# Patient Record
Sex: Female | Born: 1960 | Race: White | Hispanic: No | Marital: Single | Smoking: Never smoker
Health system: Southern US, Community
[De-identification: ages and names within clinical notes are randomized; demographics above are authoritative.]

## PROBLEM LIST (undated history)

## (undated) DIAGNOSIS — C801 Malignant (primary) neoplasm, unspecified: Secondary | ICD-10-CM

## (undated) DIAGNOSIS — K219 Gastro-esophageal reflux disease without esophagitis: Secondary | ICD-10-CM

## (undated) DIAGNOSIS — C8510 Unspecified B-cell lymphoma, unspecified site: Secondary | ICD-10-CM

## (undated) DIAGNOSIS — R079 Chest pain, unspecified: Secondary | ICD-10-CM

## (undated) DIAGNOSIS — E860 Dehydration: Secondary | ICD-10-CM

## (undated) DIAGNOSIS — E039 Hypothyroidism, unspecified: Secondary | ICD-10-CM

## (undated) HISTORY — DX: Chest pain, unspecified: R07.9

## (undated) HISTORY — PX: TOTAL ABDOMINAL HYSTERECTOMY: SHX209

## (undated) HISTORY — PX: ABDOMINAL HYSTERECTOMY: SHX81

---
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
## 2004-11-12 ENCOUNTER — Ambulatory Visit: Payer: Self-pay | Admitting: Endocrinology

## 2004-12-28 ENCOUNTER — Ambulatory Visit: Payer: Self-pay | Admitting: Obstetrics and Gynecology

## 2005-03-03 ENCOUNTER — Ambulatory Visit: Payer: Self-pay | Admitting: General Practice

## 2005-03-09 ENCOUNTER — Ambulatory Visit: Payer: Self-pay | Admitting: General Practice

## 2005-03-11 ENCOUNTER — Ambulatory Visit: Payer: Self-pay | Admitting: Internal Medicine

## 2005-03-20 ENCOUNTER — Ambulatory Visit: Payer: Self-pay | Admitting: Internal Medicine

## 2005-04-20 ENCOUNTER — Ambulatory Visit: Payer: Self-pay | Admitting: Internal Medicine

## 2005-06-11 ENCOUNTER — Ambulatory Visit: Payer: Self-pay | Admitting: Internal Medicine

## 2005-06-20 ENCOUNTER — Ambulatory Visit: Payer: Self-pay | Admitting: Internal Medicine

## 2005-10-11 ENCOUNTER — Ambulatory Visit: Payer: Self-pay | Admitting: Internal Medicine

## 2005-10-21 ENCOUNTER — Ambulatory Visit: Payer: Self-pay | Admitting: Internal Medicine

## 2005-11-18 ENCOUNTER — Ambulatory Visit: Payer: Self-pay | Admitting: Internal Medicine

## 2005-12-19 ENCOUNTER — Ambulatory Visit: Payer: Self-pay | Admitting: Internal Medicine

## 2006-03-18 ENCOUNTER — Emergency Department: Payer: Self-pay | Admitting: Internal Medicine

## 2006-04-04 ENCOUNTER — Ambulatory Visit: Payer: Self-pay | Admitting: Internal Medicine

## 2006-04-07 ENCOUNTER — Ambulatory Visit: Payer: Self-pay | Admitting: General Practice

## 2006-04-20 ENCOUNTER — Ambulatory Visit: Payer: Self-pay | Admitting: Internal Medicine

## 2006-11-01 ENCOUNTER — Ambulatory Visit: Payer: Self-pay | Admitting: Internal Medicine

## 2006-11-19 ENCOUNTER — Ambulatory Visit: Payer: Self-pay | Admitting: Internal Medicine

## 2007-04-21 ENCOUNTER — Ambulatory Visit: Payer: Self-pay | Admitting: Internal Medicine

## 2007-06-21 ENCOUNTER — Ambulatory Visit: Payer: Self-pay | Admitting: Internal Medicine

## 2008-04-12 ENCOUNTER — Emergency Department: Payer: Self-pay | Admitting: Emergency Medicine

## 2009-06-25 ENCOUNTER — Ambulatory Visit: Payer: Self-pay

## 2011-04-28 ENCOUNTER — Ambulatory Visit: Payer: Self-pay

## 2014-02-21 ENCOUNTER — Emergency Department: Payer: Self-pay | Admitting: Emergency Medicine

## 2014-02-21 LAB — CBC WITH DIFFERENTIAL/PLATELET
BASOS ABS: 0 10*3/uL (ref 0.0–0.1)
BASOS PCT: 0.2 %
EOS ABS: 0 10*3/uL (ref 0.0–0.7)
EOS PCT: 0.1 %
HCT: 37.3 % (ref 35.0–47.0)
HGB: 12.2 g/dL (ref 12.0–16.0)
LYMPHS PCT: 9.2 %
Lymphocyte #: 0.7 10*3/uL — ABNORMAL LOW (ref 1.0–3.6)
MCH: 28.9 pg (ref 26.0–34.0)
MCHC: 32.8 g/dL (ref 32.0–36.0)
MCV: 88 fL (ref 80–100)
MONO ABS: 0.7 x10 3/mm (ref 0.2–0.9)
Monocyte %: 8.8 %
Neutrophil #: 6.1 10*3/uL (ref 1.4–6.5)
Neutrophil %: 81.7 %
Platelet: 196 10*3/uL (ref 150–440)
RBC: 4.23 10*6/uL (ref 3.80–5.20)
RDW: 13.1 % (ref 11.5–14.5)
WBC: 7.5 10*3/uL (ref 3.6–11.0)

## 2014-02-21 LAB — COMPREHENSIVE METABOLIC PANEL
ALK PHOS: 87 U/L
ALT: 16 U/L (ref 12–78)
ANION GAP: 5 — AB (ref 7–16)
Albumin: 3.7 g/dL (ref 3.4–5.0)
BUN: 7 mg/dL (ref 7–18)
Bilirubin,Total: 0.3 mg/dL (ref 0.2–1.0)
CREATININE: 0.85 mg/dL (ref 0.60–1.30)
Calcium, Total: 9.2 mg/dL (ref 8.5–10.1)
Chloride: 106 mmol/L (ref 98–107)
Co2: 27 mmol/L (ref 21–32)
EGFR (Non-African Amer.): >60
Glucose: 98 mg/dL (ref 65–99)
Osmolality: 274 (ref 275–301)
Potassium: 4 mmol/L (ref 3.5–5.1)
SGOT(AST): 24 U/L (ref 15–37)
Sodium: 138 mmol/L (ref 136–145)
TOTAL PROTEIN: 7.4 g/dL (ref 6.4–8.2)

## 2014-02-21 LAB — TROPONIN I: Troponin-I: <0.02 ng/mL

## 2015-07-24 ENCOUNTER — Ambulatory Visit (INDEPENDENT_AMBULATORY_CARE_PROVIDER_SITE_OTHER): Payer: No Typology Code available for payment source

## 2015-07-24 ENCOUNTER — Encounter: Payer: Self-pay | Admitting: Emergency Medicine

## 2015-07-24 ENCOUNTER — Ambulatory Visit
Admission: EM | Admit: 2015-07-24 | Discharge: 2015-07-24 | Disposition: A | Discharge status: Home / Self Care | Payer: No Typology Code available for payment source | Attending: Family Medicine | Admitting: Family Medicine

## 2015-07-24 DIAGNOSIS — J40 Bronchitis, not specified as acute or chronic: Secondary | ICD-10-CM

## 2015-07-24 MED ORDER — AZITHROMYCIN 250 MG PO TABS: ORAL_TABLET | ORAL | Status: DC

## 2015-07-24 MED ORDER — PREDNISONE 20 MG PO TABS
40.0000 mg | ORAL_TABLET | Freq: Every day | ORAL | Status: DC
Start: 2015-07-24 — End: 2016-09-22

## 2015-07-24 MED ORDER — ALBUTEROL SULFATE HFA 108 (90 BASE) MCG/ACT IN AERS: 2.0000 | INHALATION_SPRAY | RESPIRATORY_TRACT | Status: DC | PRN

## 2015-07-24 MED ORDER — GUAIFENESIN-CODEINE 100-10 MG/5ML PO SOLN: 5.0000 mL | Freq: Three times a day (TID) | ORAL | Status: DC | PRN

## 2015-07-24 MED ORDER — IPRATROPIUM-ALBUTEROL 0.5-2.5 (3) MG/3ML IN SOLN
3.0000 mL | Freq: Once | RESPIRATORY_TRACT | Status: AC
  Administered 2015-07-24: 3 mL via RESPIRATORY_TRACT

## 2015-07-24 NOTE — ED Notes (Signed)
Patient states she has had a deep cough for the past 2 weeks, back feels tight and started hurting 3 days ago

## 2015-07-24 NOTE — Discharge Instructions (Signed)
Take medication as prescribed. Rest. Drink plenty of fluids. Take over the counter tylenol or ibuprofen as needed.   Follow up with your primary care physician next week. Return to Urgent care or proceed to ER for chest pain, shortness of breath, abdominal pain, uncontrolled fever, new or worsening concerns.

## 2015-07-24 NOTE — ED Provider Notes (Signed)
[facility] note ____________________________________________  Time seen: Approximately 12:33 PM  I have reviewed the triage vital signs and the nursing notes.   HISTORY  Chief Complaint Cough   HPI Christina [name] is a 54 y.o. female presents for complaints for x 2 weeks of cough, congestion and runny nose. States congestion and runny nose has improved but continues with cough. States main complaint is cough. States that she does intermittently hear herself wheezing. States she at times feels some chest tightness and states that that is only present with coughing and wheezing. Denies chest pain. Denies shortness of breath. Also does report she is having some achy back pain and states that she feels like it's from coughing so often. Denies fall or trauma.  States current achiness sensation is 5 out of 10. Denies pain radiation. Denies pain with breaths or deep breath. Denies fevers. States occasionally cough is productive with whitish thin phlegm. Denies neck pain. Denies headache. Denies chest pain, shortness of breath, abdominal pain. Denies dysuria, vaginal complaints, urinary or bowel changes. Reports continues to eat and drink well. States has used over-the-counter Mucinex DM and cough drops without much relief.   History reviewed. No pertinent past medical history. Denies.  There are no active problems to display for this patient.   Past Surgical History  Procedure Laterality Date  . Cesarean section      x 2     No current outpatient prescriptions on file.  Allergies Review of patient's allergies indicates no known allergies.  History reviewed. No pertinent family history.  Social History Social History  Substance Use Topics  . Smoking status: Never Smoker   . Smokeless tobacco: None  . Alcohol Use: No    Review of Systems Constitutional: No fever/chills Eyes: No visual changes. ENT: No sore throat. Positive nasal congestion and runny  nose. Cardiovascular: Denies chest pain. Respiratory: Denies shortness of breath. Positive cough. Gastrointestinal: No abdominal pain.  No nausea, no vomiting.  No diarrhea.  No constipation. Genitourinary: Negative for dysuria. Musculoskeletal: Negative for back pain. Skin: Negative for rash. Neurological: Negative for headaches, focal weakness or numbness.  10-point ROS otherwise negative.  ____________________________________________   PHYSICAL EXAM:  VITAL SIGNS: ED Triage Vitals  Enc Vitals Group     BP 07/24/15 1126 117/75 mmHg     Pulse Rate 07/24/15 1126 65     Resp 07/24/15 1126 20     Temp 07/24/15 1126 97 F (36.1 C)     Temp Source 07/24/15 1126 Tympanic     SpO2 07/24/15 1126 100 %     Weight 07/24/15 1126 151 lb (68.493 kg)     Height 07/24/15 1126 5\' 6"  (1.676 m)     Head Cir --      Peak Flow --      Pain Score 07/24/15 1125 5     Pain Loc --      Pain Edu? --      Excl. in [place]? --     Constitutional: Alert and oriented. Well appearing and in no acute distress. Eyes: Conjunctivae are normal. PERRL. EOMI. Head: Atraumatic.Sinuses Nontender. No erythema. No swelling.  Ears: no erythema, normal TMs bilaterally.   Nose: Mild clear rhinorrhea.  Mouth/Throat: Mucous membranes are moist.  Oropharynx non-erythematous. No tonsillar swelling or exudate. No uvular shift or deviation. Neck: No stridor.  No cervical spine tenderness to palpation. Hematological/Lymphatic/Immunilogical: No cervical lymphadenopathy. Cardiovascular: Normal rate, regular rhythm. Grossly normal heart sounds.  Good peripheral circulation. Respiratory:  Normal respiratory effort.  No retractions. Mild scattered rhonchi. Mild scattered inspiratory wheezes present. Dry intermittent cough present. No rales. Speaks in complete sentences. Gastrointestinal: Soft and nontender. No distention. Normal Bowel sounds.  No CVA tenderness. Musculoskeletal: No lower or upper extremity tenderness nor edema.  Bilateral pedal pulses equal and easily palpated.  Neurologic:  Normal speech and language. No gross focal neurologic deficits are appreciated. No gait instability. Skin:  Skin is warm, dry and intact. No rash noted. Psychiatric: Mood and affect are normal. Speech and behavior are normal.  ____________________________________________   LABS (all labs ordered are listed, but only abnormal results are displayed)  Labs Reviewed - No data to display  RADIOLOGY  EXAM: CHEST 2 VIEW  COMPARISON: None.  FINDINGS: Mild hyperinflation, possible asthma. Negative for pneumonia. Mild apical scarring bilaterally. Negative for mass or effusion. Heart size and vascularity normal.  IMPRESSION: Pulmonary hyperinflation, possible asthma. Lungs are clear.   Electronically Signed By: Franchot Gallo M.D. On: 07/24/2015 12:47      I, Marylene Land, personally viewed and evaluated these images (plain radiographs) as part of my medical decision making.    INITIAL IMPRESSION / ASSESSMENT AND PLAN / ED COURSE  Pertinent labs & imaging results that were available during my care of the patient were reviewed by me and considered in my medical decision making (see chart for details).  Well-appearing patient. No acute distress. Presents for 2 weeks of cough and congestion with continued cough. Reports intermittent wheezing. Denies chest pain or shortness of breath. Moist mucous membranes. Mild scattered rhonchi as well as scattered wheezes present. Albuterol ipratropium nebulizer 1. Will evaluate chest x-ray.  Post albuterol nebulizer wheezes fully resolved and rhonchi improved. Patient reports that she does feel better.   Patient had to leave urgent care for work meeting prior to myself being able to visualize x-ray. Chest x-ray pulmonary hyperinflation, possible asthma, lungs are clear per radiologist. Will treat bronchitis with oral azithromycin, when necessary pro-air inhaler, 5 day  course of prednisone as well as when necessary codeine guaifenesin cough solution. Also discussed supportive treatments including rest, fluids, when necessary Tylenol. Discussed this plan with patient face-to-face prior to her leaving tentative to chest x-ray. Also called patient and discuss final chest x-ray reading and rediscuss treatment plan. Patient states that she will stop back by urgent care to pick up her prescriptions and paperwork. Prescriptions and paperwork for the above left with Jadene Pierini. Discussed follow up with Primary care physician this week. Discussed follow up and return parameters including no resolution or any worsening concerns. Patient verbalized understanding and agreed to plan.   ____________________________________________   FINAL CLINICAL IMPRESSION(S) / ED DIAGNOSES  Final diagnoses:  Bronchitis       Marylene Land, NP 07/24/15 1448

## 2015-09-21 HISTORY — PX: REFRACTIVE SURGERY: SHX103

## 2016-09-10 ENCOUNTER — Ambulatory Visit: Payer: Self-pay | Admitting: Family Medicine

## 2016-09-20 DIAGNOSIS — C801 Malignant (primary) neoplasm, unspecified: Secondary | ICD-10-CM

## 2016-09-20 HISTORY — DX: Malignant (primary) neoplasm, unspecified: C80.1

## 2016-09-22 ENCOUNTER — Encounter: Payer: Self-pay | Admitting: Unknown Physician Specialty

## 2016-09-22 ENCOUNTER — Ambulatory Visit: Payer: Self-pay | Admitting: Family Medicine

## 2016-09-22 ENCOUNTER — Ambulatory Visit (INDEPENDENT_AMBULATORY_CARE_PROVIDER_SITE_OTHER): Payer: Managed Care, Other (non HMO) | Admitting: Unknown Physician Specialty

## 2016-09-22 VITALS — BP 116/75 | HR 62 | Temp 98.3°F | Ht 65.1 in | Wt 160.8 lb

## 2016-09-22 DIAGNOSIS — Z23 Encounter for immunization: Secondary | ICD-10-CM

## 2016-09-22 DIAGNOSIS — R221 Localized swelling, mass and lump, neck: Secondary | ICD-10-CM | POA: Diagnosis not present

## 2016-09-22 MED ORDER — FLUCONAZOLE 150 MG PO TABS: 150.0000 mg | ORAL_TABLET | Freq: Once | ORAL | 0 refills | Status: AC

## 2016-09-22 MED ORDER — DOXYCYCLINE HYCLATE 100 MG PO TABS: 100.0000 mg | ORAL_TABLET | Freq: Two times a day (BID) | ORAL | 0 refills | Status: DC

## 2016-09-22 NOTE — Progress Notes (Signed)
BP 116/75 (BP Location: Left Arm, Patient Position: Sitting, Cuff Size: Large)   Pulse 62   Temp 98.3 F (36.8 C)   Ht 5' 5.1" (1.654 m)   Wt 160 lb 12.8 oz (72.9 kg)   LMP  (LMP Unknown)   SpO2 97%   BMI 26.68 kg/m    Subjective:    Patient ID: Christina [name], female    DOB: [birth year], 56 y.o.   MRN: [id number]  HPI: Christina [name] is a 56 y.o. female  Chief Complaint  Patient presents with  . Cyst    pt states she has a knot on the right side of her neck that came up about a month ago, states it is not painful to the touch   Pt is here with her husband with cc of a knot on the right side of neck.  She works at [employer] and vet "put a needle in it" about 2 weeks ago and unable to withdraw fluid but "didn't go . States the size fluctuates with getting bigger and smaller.  No fever.  She did have a sore throat for a week when she first noticed it. No problems with voice or swallowing.  No cough.  Husband says her energy level is down.  She is a non-smoker  Relevant past medical, surgical, family and social history reviewed and updated as indicated. Interim medical history since our last visit reviewed. Allergies and medications reviewed and updated.  Review of Systems  Per HPI unless specifically indicated above     Objective:    BP 116/75 (BP Location: Left Arm, Patient Position: Sitting, Cuff Size: Large)   Pulse 62   Temp 98.3 F (36.8 C)   Ht 5' 5.1" (1.654 m)   Wt 160 lb 12.8 oz (72.9 kg)   LMP  (LMP Unknown)   SpO2 97%   BMI 26.68 kg/m   Wt Readings from Last 3 Encounters:  09/22/16 160 lb 12.8 oz (72.9 kg)  07/24/15 151 lb (68.5 kg)    Physical Exam  Constitutional: She is oriented to person, place, and time. She appears well-developed and well-nourished. No distress.  HENT:  Head: Normocephalic and atraumatic.  Eyes: Conjunctivae and lids are normal. Right eye exhibits no discharge. Left eye exhibits no discharge. No scleral icterus.  Neck:  Normal range of motion. Neck supple. No JVD present. Carotid bruit is not present.    Cardiovascular: Normal rate, regular rhythm and normal heart sounds.   Pulmonary/Chest: Effort normal and breath sounds normal.  Abdominal: Normal appearance. There is no splenomegaly or hepatomegaly.  Musculoskeletal: Normal range of motion.  Neurological: She is alert and oriented to person, place, and time.  Skin: Skin is warm, dry and intact. No rash noted. No pallor.  Psychiatric: She has a normal mood and affect. Her behavior is normal. Judgment and thought content normal.    Results for orders placed or performed in visit on 02/21/14  CBC with Differential/Platelet  Result Value Ref Range   WBC 7.5 3.6 - 11.0 x10 3/mm 3   RBC 4.23 3.80 - 5.20 X10 6/mm 3   HGB 12.2 12.0 - 16.0 g/dL   HCT 37.3 35.0 - 47.0 %   MCV 88 80 - 100 fL   MCH 28.9 26.0 - 34.0 pg   MCHC 32.8 32.0 - 36.0 g/dL   RDW 13.1 11.5 - 14.5 %   Platelet 196 150 - 440 x10 3/mm 3   Neutrophil % 81.7 %  Lymphocyte % 9.2 %   Monocyte % 8.8 %   Eosinophil % 0.1 %   Basophil % 0.2 %   Neutrophil # 6.1 1.4 - 6.5 x10 3/mm 3   Lymphocyte # 0.7 (L) 1.0 - 3.6 x10 3/mm 3   Monocyte # 0.7 0.2 - 0.9 x10 3/mm    Eosinophil # 0.0 0.0 - 0.7 x10 3/mm 3   Basophil # 0.0 0.0 - 0.1 x10 3/mm 3  Comprehensive metabolic panel  Result Value Ref Range   Glucose 98 65 - 99 mg/dL   BUN 7 7 - 18 mg/dL   Creatinine 0.85 0.60 - 1.30 mg/dL   Sodium 138 136 - 145 mmol/L   Potassium 4.0 3.5 - 5.1 mmol/L   Chloride 106 98 - 107 mmol/L   Co2 27 21 - 32 mmol/L   Calcium, Total 9.2 8.5 - 10.1 mg/dL   SGOT(AST) 24 15 - 37 Unit/L   SGPT (ALT) 16 12 - 78 U/L   Alkaline Phosphatase 87 Unit/L   Albumin 3.7 3.4 - 5.0 g/dL   Total Protein 7.4 6.4 - 8.2 g/dL   Bilirubin,Total 0.3 0.2 - 1.0 mg/dL   Osmolality 274 275 - 301   Anion Gap 5 (L) 7 - 16   EGFR (African American) >60    EGFR (Non-African Amer.) >60   Troponin I  Result Value Ref Range    Troponin-I < 0.02 ng/mL      Assessment & Plan:   Problem List Items Addressed This Visit    None    Visit Diagnoses    Need for diphtheria-tetanus-pertussis (Tdap) vaccine, adult/adolescent    -  Primary   Relevant Orders   Tdap vaccine greater than or equal to 7yo IM (Completed)   Neck mass       Pt with mass on neck.  Treat for lymphadenitis.  Refer to ENT for further managment   Relevant Orders   Ambulatory referral to ENT       Follow up plan: Return for for physical.  And with ENT

## 2016-09-22 NOTE — Patient Instructions (Addendum)
Tdap Vaccine (Tetanus, Diphtheria and Pertussis): What You Need to Know 1. Why get vaccinated? Tetanus, diphtheria and pertussis are very serious diseases. Tdap vaccine can protect us from these diseases. And, Tdap vaccine given to pregnant women can protect newborn babies against pertussis. TETANUS (Lockjaw) is rare in the [place] today. It causes painful muscle tightening and stiffness, usually all over the body.  It can lead to tightening of muscles in the head and neck so you can't open your mouth, swallow, or sometimes even breathe. Tetanus kills about 1 out of 10 people who are infected even after receiving the best medical care.  DIPHTHERIA is also rare in the [place] today. It can cause a thick coating to form in the back of the throat.  It can lead to breathing problems, heart failure, paralysis, and death.  PERTUSSIS (Whooping Cough) causes severe coughing spells, which can cause difficulty breathing, vomiting and disturbed sleep.  It can also lead to weight loss, incontinence, and rib fractures. Up to 2 in 100 adolescents and 5 in 100 adults with pertussis are hospitalized or have complications, which could include pneumonia or death.  These diseases are caused by bacteria. Diphtheria and pertussis are spread from person to person through secretions from coughing or sneezing. Tetanus enters the body through cuts, scratches, or wounds. Before vaccines, as many as 200,000 cases of diphtheria, 200,000 cases of pertussis, and hundreds of cases of tetanus, were reported in the [place] each year. Since vaccination began, reports of cases for tetanus and diphtheria have dropped by about 99% and for pertussis by about 80%. 2. Tdap vaccine Tdap vaccine can protect adolescents and adults from tetanus, diphtheria, and pertussis. One dose of Tdap is routinely given at age 11 or 12. People who did not get Tdap at that age should get it as soon as possible. Tdap is especially  important for healthcare professionals and anyone having close contact with a baby younger than 12 months. Pregnant women should get a dose of Tdap during every pregnancy, to protect the newborn from pertussis. Infants are most at risk for severe, life-threatening complications from pertussis. Another vaccine, called Td, protects against tetanus and diphtheria, but not pertussis. A Td booster should be given every 10 years. Tdap may be given as one of these boosters if you have never gotten Tdap before. Tdap may also be given after a severe cut or burn to prevent tetanus infection. Your doctor or the person giving you the vaccine can give you more information. Tdap may safely be given at the same time as other vaccines. 3. Some people should not get this vaccine  A person who has ever had a life-threatening allergic reaction after a previous dose of any diphtheria, tetanus or pertussis containing vaccine, OR has a severe allergy to any part of this vaccine, should not get Tdap vaccine. Tell the person giving the vaccine about any severe allergies.  Anyone who had coma or long repeated seizures within 7 days after a childhood dose of DTP or DTaP, or a previous dose of Tdap, should not get Tdap, unless a cause other than the vaccine was found. They can still get Td.  Talk to your doctor if you: ? have seizures or another nervous system problem, ? had severe pain or swelling after any vaccine containing diphtheria, tetanus or pertussis, ? ever had a condition called Guillain-Barr Syndrome (GBS), ? aren't feeling well on the day the shot is scheduled. 4. Risks With any medicine, including   vaccines, there is a chance of side effects. These are usually mild and go away on their own. Serious reactions are also possible but are rare. Most people who get Tdap vaccine do not have any problems with it. Mild problems following Tdap: (Did not interfere with activities)  Pain where the shot was given (about  3 in 4 adolescents or 2 in 3 adults)  Redness or swelling where the shot was given (about 1 person in 5)  Mild fever of at least 100.4F (up to about 1 in 25 adolescents or 1 in 100 adults)  Headache (about 3 or 4 people in 10)  Tiredness (about 1 person in 3 or 4)  Nausea, vomiting, diarrhea, stomach ache (up to 1 in 4 adolescents or 1 in 10 adults)  Chills, sore joints (about 1 person in 10)  Body aches (about 1 person in 3 or 4)  Rash, swollen glands (uncommon)  Moderate problems following Tdap: (Interfered with activities, but did not require medical attention)  Pain where the shot was given (up to 1 in 5 or 6)  Redness or swelling where the shot was given (up to about 1 in 16 adolescents or 1 in 12 adults)  Fever over 102F (about 1 in 100 adolescents or 1 in 250 adults)  Headache (about 1 in 7 adolescents or 1 in 10 adults)  Nausea, vomiting, diarrhea, stomach ache (up to 1 or 3 people in 100)  Swelling of the entire arm where the shot was given (up to about 1 in 500).  Severe problems following Tdap: (Unable to perform usual activities; required medical attention)  Swelling, severe pain, bleeding and redness in the arm where the shot was given (rare).  Problems that could happen after any vaccine:  People sometimes faint after a medical procedure, including vaccination. Sitting or lying down for about 15 minutes can help prevent fainting, and injuries caused by a fall. Tell your doctor if you feel dizzy, or have vision changes or ringing in the ears.  Some people get severe pain in the shoulder and have difficulty moving the arm where a shot was given. This happens very rarely.  Any medication can cause a severe allergic reaction. Such reactions from a vaccine are very rare, estimated at fewer than 1 in a million doses, and would happen within a few minutes to a few hours after the vaccination. As with any medicine, there is a very remote chance of a vaccine  causing a serious injury or death. The safety of vaccines is always being monitored. For more information, visit: www.cdc.gov/vaccinesafety/ 5. What if there is a serious problem? What should I look for? Look for anything that concerns you, such as signs of a severe allergic reaction, very high fever, or unusual behavior. Signs of a severe allergic reaction can include hives, swelling of the face and throat, difficulty breathing, a fast heartbeat, dizziness, and weakness. These would usually start a few minutes to a few hours after the vaccination. What should I do?  If you think it is a severe allergic reaction or other emergency that can't wait, call 9-1-1 or get the person to the nearest hospital. Otherwise, call your doctor.  Afterward, the reaction should be reported to the Vaccine Adverse Event Reporting System (VAERS). Your doctor might file this report, or you can do it yourself through the VAERS web site at www.vaers.hhs.gov, or by calling 1-800-822-7967. ? VAERS does not give medical advice. 6. The National Vaccine Injury Compensation Program The National   Vaccine Injury Compensation Program (VICP) is a federal program that was created to compensate people who may have been injured by certain vaccines. Persons who believe they may have been injured by a vaccine can learn about the program and about filing a claim by calling 1-800-338-2382 or visiting the VICP website at www.hrsa.gov/vaccinecompensation. There is a time limit to file a claim for compensation. 7. How can I learn more?  Ask your doctor. He or she can give you the vaccine package insert or suggest other sources of information.  Call your local or state health department.  Contact the Centers for Disease Control and Prevention (CDC): ? Call 1-800-232-4636 (1-800-CDC-INFO) or ? Visit CDC's website at www.cdc.gov/vaccines CDC Tdap Vaccine VIS (11/13/13) This information is not intended to replace advice given to you by your  health care provider. Make sure you discuss any questions you have with your health care provider. Document Released: 03/07/2012 Document Revised: 05/27/2016 Document Reviewed: 05/27/2016 Elsevier Interactive Patient Education  2017 [employer]  

## 2016-09-22 NOTE — Addendum Note (Signed)
Addended by: [name] on: 09/22/2016 01:55 PM   Modules accepted: Orders

## 2016-09-28 ENCOUNTER — Other Ambulatory Visit: Payer: Self-pay | Admitting: Unknown Physician Specialty

## 2016-09-28 DIAGNOSIS — R221 Localized swelling, mass and lump, neck: Secondary | ICD-10-CM

## 2016-10-01 ENCOUNTER — Ambulatory Visit
Admission: RE | Admit: 2016-10-01 | Discharge: 2016-10-01 | Disposition: A | Discharge status: Home / Self Care | Payer: Managed Care, Other (non HMO) | Source: Ambulatory Visit | Attending: Unknown Physician Specialty | Admitting: Unknown Physician Specialty

## 2016-10-01 DIAGNOSIS — R221 Localized swelling, mass and lump, neck: Secondary | ICD-10-CM

## 2016-10-01 DIAGNOSIS — C770 Secondary and unspecified malignant neoplasm of lymph nodes of head, face and neck: Secondary | ICD-10-CM | POA: Insufficient documentation

## 2016-10-01 MED ORDER — IOPAMIDOL (ISOVUE-300) INJECTION 61%
75.0000 mL | Freq: Once | INTRAVENOUS | Status: AC | PRN
  Administered 2016-10-01: 75 mL via INTRAVENOUS

## 2016-10-11 ENCOUNTER — Other Ambulatory Visit: Payer: Self-pay | Admitting: Unknown Physician Specialty

## 2016-10-11 DIAGNOSIS — R221 Localized swelling, mass and lump, neck: Secondary | ICD-10-CM

## 2016-10-15 ENCOUNTER — Other Ambulatory Visit: Payer: Self-pay | Admitting: Radiology

## 2016-10-18 ENCOUNTER — Ambulatory Visit
Admission: RE | Admit: 2016-10-18 | Discharge: 2016-10-18 | Disposition: A | Discharge status: Home / Self Care | Payer: Managed Care, Other (non HMO) | Source: Ambulatory Visit | Attending: Unknown Physician Specialty | Admitting: Unknown Physician Specialty

## 2016-10-18 DIAGNOSIS — C8511 Unspecified B-cell lymphoma, lymph nodes of head, face, and neck: Secondary | ICD-10-CM | POA: Insufficient documentation

## 2016-10-18 DIAGNOSIS — R221 Localized swelling, mass and lump, neck: Secondary | ICD-10-CM

## 2016-10-18 LAB — CBC
HEMATOCRIT: 35.4 % (ref 35.0–47.0)
HEMOGLOBIN: 12.5 g/dL (ref 12.0–16.0)
MCH: 30.2 pg (ref 26.0–34.0)
MCHC: 35.3 g/dL (ref 32.0–36.0)
MCV: 85.5 fL (ref 80.0–100.0)
Platelets: 253 10*3/uL (ref 150–440)
RBC: 4.14 MIL/uL (ref 3.80–5.20)
RDW: 12.9 % (ref 11.5–14.5)
WBC: 8.4 10*3/uL (ref 3.6–11.0)

## 2016-10-18 LAB — APTT: aPTT: 31 seconds (ref 24–36)

## 2016-10-18 LAB — PROTIME-INR
INR: 0.99
Prothrombin Time: 13.1 seconds (ref 11.4–15.2)

## 2016-10-18 MED ORDER — ACETAMINOPHEN 500 MG PO TABS
1000.0000 mg | ORAL_TABLET | Freq: Four times a day (QID) | ORAL | Status: DC | PRN
  Administered 2016-10-18: 1000 mg via ORAL
  Filled 2016-10-18: qty 2

## 2016-10-18 MED ORDER — SODIUM CHLORIDE 0.9 % IV SOLN: INTRAVENOUS | Status: DC

## 2016-10-18 MED ORDER — IBUPROFEN 200 MG PO TABS
600.0000 mg | ORAL_TABLET | Freq: Once | ORAL | Status: DC | PRN
Start: 2016-10-18 — End: 2016-10-19
  Filled 2016-10-18: qty 3

## 2016-10-18 MED ORDER — BUPIVACAINE HCL (PF) 0.25 % IJ SOLN
INTRAMUSCULAR | Status: AC
  Filled 2016-10-18: qty 30

## 2016-10-18 MED ORDER — ACETAMINOPHEN 500 MG PO TABS
ORAL_TABLET | ORAL | Status: AC
  Filled 2016-10-18: qty 2

## 2016-10-18 NOTE — Procedures (Signed)
[place] right lymph node biopsy without difficulty  Complications:  None  Blood Loss: none  See dictation in canopy pacs

## 2016-10-19 ENCOUNTER — Ambulatory Visit: Payer: No Typology Code available for payment source

## 2016-10-25 ENCOUNTER — Ambulatory Visit: Payer: No Typology Code available for payment source | Admitting: Oncology

## 2016-10-25 ENCOUNTER — Other Ambulatory Visit: Payer: Self-pay | Admitting: Pathology

## 2016-10-25 LAB — SURGICAL PATHOLOGY

## 2016-11-01 ENCOUNTER — Other Ambulatory Visit: Payer: Self-pay | Admitting: Oncology

## 2016-11-01 ENCOUNTER — Telehealth: Payer: Self-pay | Admitting: *Deleted

## 2016-11-01 ENCOUNTER — Encounter: Payer: Self-pay | Admitting: Oncology

## 2016-11-01 ENCOUNTER — Inpatient Hospital Stay: Payer: Managed Care, Other (non HMO)

## 2016-11-01 ENCOUNTER — Inpatient Hospital Stay: Payer: Managed Care, Other (non HMO) | Attending: Oncology | Admitting: Oncology

## 2016-11-01 VITALS — BP 108/74 | HR 68 | Temp 97.9°F | Resp 18 | Ht 67.13 in | Wt 158.7 lb

## 2016-11-01 DIAGNOSIS — C8580 Other specified types of non-Hodgkin lymphoma, unspecified site: Secondary | ICD-10-CM

## 2016-11-01 DIAGNOSIS — K259 Gastric ulcer, unspecified as acute or chronic, without hemorrhage or perforation: Secondary | ICD-10-CM | POA: Insufficient documentation

## 2016-11-01 DIAGNOSIS — C8331 Diffuse large B-cell lymphoma, lymph nodes of head, face, and neck: Secondary | ICD-10-CM

## 2016-11-01 DIAGNOSIS — Z7689 Persons encountering health services in other specified circumstances: Secondary | ICD-10-CM | POA: Diagnosis not present

## 2016-11-01 DIAGNOSIS — R109 Unspecified abdominal pain: Secondary | ICD-10-CM | POA: Diagnosis not present

## 2016-11-01 DIAGNOSIS — R590 Localized enlarged lymph nodes: Secondary | ICD-10-CM | POA: Diagnosis not present

## 2016-11-01 DIAGNOSIS — R05 Cough: Secondary | ICD-10-CM | POA: Insufficient documentation

## 2016-11-01 DIAGNOSIS — Z801 Family history of malignant neoplasm of trachea, bronchus and lung: Secondary | ICD-10-CM | POA: Diagnosis not present

## 2016-11-01 DIAGNOSIS — Z803 Family history of malignant neoplasm of breast: Secondary | ICD-10-CM | POA: Insufficient documentation

## 2016-11-01 DIAGNOSIS — Z5112 Encounter for antineoplastic immunotherapy: Secondary | ICD-10-CM | POA: Diagnosis present

## 2016-11-01 DIAGNOSIS — Z7189 Other specified counseling: Secondary | ICD-10-CM

## 2016-11-01 DIAGNOSIS — F419 Anxiety disorder, unspecified: Secondary | ICD-10-CM | POA: Insufficient documentation

## 2016-11-01 DIAGNOSIS — R51 Headache: Secondary | ICD-10-CM | POA: Insufficient documentation

## 2016-11-01 LAB — CBC
HCT: 37.5 % (ref 35.0–47.0)
Hemoglobin: 12.5 g/dL (ref 12.0–16.0)
MCH: 28.5 pg (ref 26.0–34.0)
MCHC: 33.3 g/dL (ref 32.0–36.0)
MCV: 85.4 fL (ref 80.0–100.0)
PLATELETS: 247 10*3/uL (ref 150–440)
RBC: 4.39 MIL/uL (ref 3.80–5.20)
RDW: 12.8 % (ref 11.5–14.5)
WBC: 4.8 10*3/uL (ref 3.6–11.0)

## 2016-11-01 LAB — COMPREHENSIVE METABOLIC PANEL
ALK PHOS: 76 U/L (ref 38–126)
ALT: 31 U/L (ref 14–54)
AST: 37 U/L (ref 15–41)
Albumin: 3.8 g/dL (ref 3.5–5.0)
Anion gap: 6 (ref 5–15)
BILIRUBIN TOTAL: 0.1 mg/dL — AB (ref 0.3–1.2)
BUN: 12 mg/dL (ref 6–20)
CALCIUM: 8.6 mg/dL — AB (ref 8.9–10.3)
CHLORIDE: 106 mmol/L (ref 101–111)
CO2: 26 mmol/L (ref 22–32)
CREATININE: 0.72 mg/dL (ref 0.44–1.00)
Glucose, Bld: 102 mg/dL — ABNORMAL HIGH (ref 65–99)
Potassium: 3.7 mmol/L (ref 3.5–5.1)
Sodium: 138 mmol/L (ref 135–145)
TOTAL PROTEIN: 7.6 g/dL (ref 6.5–8.1)

## 2016-11-01 LAB — LACTATE DEHYDROGENASE: LDH: 178 U/L (ref 98–192)

## 2016-11-01 NOTE — Progress Notes (Addendum)
Hematology/Oncology Consult note [facility] Telephone:([phone] [phone]  Patient Care Team: Guadalupe Maple, MD as PCP - General (Family Medicine)   Name of the patient: Christina [name]  [id number]  1961-08-23    Reason for consultation- newly diagnosed DLBCL   Referring physician- Dr. Tami Ribas  Date of visit: 11/01/16   History of presenting illness- 1. patient is a 56 year old female who presented to her primary care doctor with symptoms of right neck swelling in January 2018. Her symptoms of an ongoing since December 2017. Patient works at a [employer] (medial in the swelling but the swelling did not go away. She was then referred to ENT.   2. CT of the soft tissue neck with contrast showed malignant right leg lymphadenopathy with heterogeneous enhancement and extracapsular extension occupying both the right level II and level III nodal stations. Individually B abnormal lymph nodes measure up to 4.2 cm in largest dimension and the conglomerulation of abnormal lymph nodes is 5-5.5 cm across. There is a small but asymmetric and conspicuity 7 mm lymph node along the lower right 3-B station. Mass effect on the right carotid space from the abnormal nodes but the major vascular structures in the neck at the skull base including the right IJ remained patent.  3. 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 Cells were CD20 positive and BCL 6 and partially dim BCL-2 positive. B cells were negative for C myc (10%). Mom one was positive in 50% of the cells. Ki-67 was 50%.  4. Patient is otherwise doing well and denies any symptoms of fevers, chills, unintentional weight loss or drenching night sweats. She does not have any significant comorbidities. Currently reports dry non productive cough for last 1 week  ECOG PS- 0  Pain scale- 0   Review of systems- Review of Systems    Constitutional: Negative for chills, fever, malaise/fatigue and weight loss.  HENT: Negative for congestion, ear discharge and nosebleeds.        Neck swelling  Eyes: Negative for blurred vision.  Respiratory: Positive for cough. Negative for hemoptysis, sputum production, shortness of breath and wheezing.   Cardiovascular: Negative for chest pain, palpitations, orthopnea and claudication.  Gastrointestinal: Negative for abdominal pain, blood in stool, constipation, diarrhea, heartburn, melena, nausea and vomiting.  Genitourinary: Negative for dysuria, flank pain, frequency, hematuria and urgency.  Musculoskeletal: Negative for back pain, joint pain and myalgias.  Skin: Negative for rash.  Neurological: Negative for dizziness, tingling, focal weakness, seizures, weakness and headaches.  Endo/Heme/Allergies: Does not bruise/bleed easily.  Psychiatric/Behavioral: Negative for depression and suicidal ideas. The patient does not have insomnia.     No Known Allergies  There are no active problems to display for this patient.    No past medical history on file.   Past Surgical History:  Procedure Laterality Date  . ABDOMINAL HYSTERECTOMY    . CESAREAN SECTION     x 2   . REFRACTIVE SURGERY  09/2015    Social History   Social History  . Marital status: Single    Spouse name: N/A  . Number of children: N/A  . Years of education: N/A   Occupational History  . Not on file.   Social History Main Topics  . Smoking status: Never Smoker  . Smokeless tobacco: Never Used  . Alcohol use No  . Drug use: No  . Sexual activity: Yes   Other Topics Concern  .  Not on file   Social History Narrative  . No narrative on file     Family History  Problem Relation Age of Onset  . Cancer Mother     breast  . Cancer Father     lung  . Heart disease Brother 63    MI  . Heart disease Paternal Grandfather     MI     Current Outpatient Prescriptions:  .  doxycycline (VIBRA-TABS)  100 MG tablet, Take 1 tablet (100 mg total) by mouth 2 (two) times daily. (Patient not taking: Reported on 10/18/2016), Disp: 20 tablet, Rfl: 0 .  estradiol (CLIMARA - DOSED IN MG/24 HR) 0.05 mg/24hr patch, once a week., Disp: , Rfl:    Physical exam:  Vitals:   11/01/16 0909  BP: 108/74  Pulse: 68  Resp: 18  Temp: 97.9 F (36.6 C)  TempSrc: Tympanic  Weight: 158 lb 11.7 oz (72 kg)  Height: 5' 7.13" (1.705 m)   Physical Exam  Constitutional: She is oriented to person, place, and time and well-developed, well-nourished, and in no distress.  HENT:  Head: Normocephalic and atraumatic.  Eyes: EOM are normal. Pupils are equal, round, and reactive to light.  Neck: Normal range of motion.  Ill defined enlarged right cervical nodes measuring about 4- 5 cm in size  Cardiovascular: Normal rate, regular rhythm and normal heart sounds.   Pulmonary/Chest: Effort normal and breath sounds normal.  Abdominal: Soft. Bowel sounds are normal.  Lymphadenopathy:  Palpable cervical adenopathy. No palpable axillary or inguinal adenopathy.  Neurological: She is alert and oriented to person, place, and time.  Skin: Skin is warm and dry.       CMP Latest Ref Rng & Units 02/21/2014  Glucose 65 - 99 mg/dL 98  BUN 7 - 18 mg/dL 7  Creatinine 0.60 - 1.30 mg/dL 0.85  Sodium 136 - 145 mmol/L 138  Potassium 3.5 - 5.1 mmol/L 4.0  Chloride 98 - 107 mmol/L 106  CO2 21 - 32 mmol/L 27  Calcium 8.5 - 10.1 mg/dL 9.2  Total Protein 6.4 - 8.2 g/dL 7.4  Total Bilirubin 0.2 - 1.0 mg/dL 0.3  Alkaline Phos Unit/L 87  AST 15 - 37 Unit/L 24  ALT 12 - 78 U/L 16   CBC Latest Ref Rng & Units 10/18/2016  WBC 3.6 - 11.0 K/uL 8.4  Hemoglobin 12.0 - 16.0 g/dL 12.5  Hematocrit 35.0 - 47.0 % 35.4  Platelets 150 - 440 K/uL 253    No images are attached to the encounter.  US Biopsy  Result Date: 10/18/2016 INDICATION: Multiple right cervical lymphadenopathy EXAM: ULTRASOUND BIOPSY CERVICAL LYMPHADENOPATHY MEDICATIONS:  None. ANESTHESIA/SEDATION: None FLUOROSCOPY TIME:  Not applicable COMPLICATIONS: None immediate. PROCEDURE: Informed written consent was obtained from the patient after a thorough discussion of the procedural risks, benefits and alternatives. All questions were addressed. Maximal Sterile Barrier Technique was utilized including caps, mask, sterile gowns, sterile gloves, sterile drape, hand hygiene and skin antiseptic. A timeout was performed prior to the initiation of the procedure. Utilizing 0.25% Marcaine as a local and deep tissue anesthetic and real-time ultrasound guidance a 15 gauge guiding needle was placed percutaneously into the right neck adjacent to 1 of the abnormally enlarged lymph nodes beneath the sternocleidomastoid muscle. Dedicated ultrasound imaging was obtained during needle placement. Imaging was also obtained during core biopsy. Multiple 16 gauge core biopsies were then obtained. The initial biopsy was sent for touch prep evaluation and felt to be adequate as determined by the pathologist.  Three more sizable cores were obtained and sent for pathologic evaluation. The guiding needle was then removed and hemostasis obtained at the puncture site. Sterile dressing was applied. The patient tolerated the procedure well and was returned her room in satisfactory condition. IMPRESSION: Successful ultrasound-guided biopsy of right cervical lymphadenopathy. The initial touch prep sample was deemed adequate by the pathologist. Electronically Signed   By: Inez Catalina M.D.   On: 10/18/2016 09:31    Assessment and plan- Patient is a 56 y.o. female with a history of newly diagnosed diffuse large B-cell lymphoma  1. I explained to the patient that we will need a PET CT scan to assess if there are other lymph node regions and board which would determine her stage. We would also need to get a bone marrow biopsy to complete her staging workup. Based on her stage she would need 3-6 cycles of chemotherapy with  RCHOP. I discussed the risks and benefits of chemotherapy including all but not limited to nausea, vomiting, fatigue, low blood counts, risk of infection and hair loss. Risk of peripheral neuropathy associated with vincristine and cardiotoxicity associated with anthracycline. Patient understands and agrees to proceed. Chemotherapy will be given with a curative intent and typically the response rate to our CHOP and diffuse large B-cell lymphoma is good and the 5 year DFS and overall survival is 70 and 75% respectively Chemotherapy is typically done and the every 3 weeks and she will need a port placement for the same. This combination chemotherapy immunotherapy also requires the use of doxorubicin which is an anthracycline and associated with cardiotoxicity. Therefore we will get an echocardiogram at baseline. Rituxan does have a risk of reactivation of hepatitis B and hence I would obtain HIV, hepatitis C antibody as well as hepatitis B surface antigen and core antibody in addition to CMP, CMP and LDH today. We will schedule for an bone marrow biopsy by IR ASAP. I will see her tentatively in 10 days' time and start chemotherapy at that time.   Patient had multiple insightful questions and all of them were answered to patient's satisfaction.   Total face to face encounter time for this patient visit was 60 min. >50% of the time was  spent in counseling and coordination of care.      Thank you for this kind referral and the opportunity to participate in the care of this patient   Visit Diagnosis 1. 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 Goals of care, counseling/discussion     Dr. Randa Evens, MD, MPH [facility] Pager- [phone] 11/01/2016 9:59 AM

## 2016-11-01 NOTE — Progress Notes (Signed)
New patient here with a right cervicle lymph node that is swollen non tender. Biopsy came back B cell lymphoma. Patient also recovering from sinus infection. Has a nagging cough. No other chronic health conditions. Denies any pain. Appetite is normal. No difficulty swallowing.

## 2016-11-01 NOTE — Addendum Note (Signed)
Addended by: Luella Cook on: 11/01/2016 11:43 AM   Modules accepted: Orders

## 2016-11-01 NOTE — Telephone Encounter (Signed)
Called the pt's cell phone and her boyfriend answered the phone can he and  was given the bx date 2/14 arrive at 10:30 and procedure to be at 11:00 bx first and then port placement. He will let her know. He knows NPO for 8 hours, sips of water to take medicine.

## 2016-11-02 ENCOUNTER — Other Ambulatory Visit: Payer: Self-pay | Admitting: Radiology

## 2016-11-02 LAB — HEPATITIS C ANTIBODY: HCV Ab: <0.1 s/co ratio (ref 0.0–0.9)

## 2016-11-02 LAB — HIV ANTIBODY (ROUTINE TESTING W REFLEX): HIV Screen 4th Generation wRfx: NONREACTIVE

## 2016-11-02 LAB — HEPATITIS B CORE ANTIBODY, TOTAL: HEP B C TOTAL AB: NEGATIVE

## 2016-11-02 LAB — HEPATITIS B SURFACE ANTIGEN: HEP B S AG: NEGATIVE

## 2016-11-02 NOTE — Patient Instructions (Signed)
Rituximab injection What is this medicine? RITUXIMAB (ri TUX i mab) is a monoclonal antibody. It is used to treat non-Hodgkin lymphoma and chronic lymphocytic leukemia. It is also used to treat rheumatoid arthritis (RA). In RA, this medicine slows the inflammatory process and help reduce joint pain and swelling. This medicine is often used with other cancer or arthritis medications. This medicine may be used for other purposes; ask your health care provider or pharmacist if you have questions. COMMON BRAND NAME(S): Rituxan What should I tell my health care provider before I take this medicine? They need to know if you have any of these conditions: -heart disease -infection (especially a virus infection such as hepatitis B, chickenpox, cold sores, or herpes) -immune system problems -irregular heartbeat -kidney disease -lung or breathing disease, like asthma -recently received or scheduled to receive a vaccine -an unusual or allergic reaction to rituximab, mouse proteins, other medicines, foods, dyes, or preservatives -pregnant or trying to get pregnant -breast-feeding How should I use this medicine? This medicine is for infusion into a vein. It is administered in a hospital or clinic by a specially trained health care professional. A special MedGuide will be given to you by the pharmacist with each prescription and refill. Be sure to read this information carefully each time. Talk to your pediatrician regarding the use of this medicine in children. This medicine is not approved for use in children. Overdosage: If you think you have taken too much of this medicine contact a poison control center or emergency room at once. NOTE: This medicine is only for you. Do not share this medicine with others. What if I miss a dose? It is important not to miss a dose. Call your doctor or health care professional if you are unable to keep an appointment. What may interact with this  medicine? -cisplatin -medicines for blood pressure -some other medicines for arthritis -vaccines This list may not describe all possible interactions. Give your health care provider a list of all the medicines, herbs, non-prescription drugs, or dietary supplements you use. Also tell them if you smoke, drink alcohol, or use illegal drugs. Some items may interact with your medicine. What should I watch for while using this medicine? Report any side effects that you notice during your treatment right away, such as changes in your breathing, fever, chills, dizziness or lightheadedness. These effects are more common with the first dose. Visit your prescriber or health care professional for checks on your progress. You will need to have regular blood work. Report any other side effects. The side effects of this medicine can continue after you finish your treatment. Continue your course of treatment even though you feel ill unless your doctor tells you to stop. Call your doctor or health care professional for advice if you get a fever, chills or sore throat, or other symptoms of a cold or flu. Do not treat yourself. This drug decreases your body's ability to fight infections. Try to avoid being around people who are sick. This medicine may increase your risk to bruise or bleed. Call your doctor or health care professional if you notice any unusual bleeding. Be careful brushing and flossing your teeth or using a toothpick because you may get an infection or bleed more easily. If you have any dental work done, tell your dentist you are receiving this medicine. Avoid taking products that contain aspirin, acetaminophen, ibuprofen, naproxen, or ketoprofen unless instructed by your doctor. These medicines may hide a fever. Do not become pregnant  while taking this medicine. Women should inform their doctor if they wish to become pregnant or think they might be pregnant. There is a potential for serious side effects  to an unborn child. Talk to your health care professional or pharmacist for more information. Do not breast-feed an infant while taking this medicine. What side effects may I notice from receiving this medicine? Side effects that you should report to your doctor or health care professional as soon as possible: -allergic reactions like skin rash, itching or hives, swelling of the face, lips, or tongue -low blood counts - this medicine may decrease the number of white blood cells, red blood cells and platelets. You may be at increased risk for infections and bleeding. -signs of infection - fever or chills, cough, sore throat, pain or difficulty passing urine -signs of decreased platelets or bleeding - bruising, pinpoint red spots on the skin, black, tarry stools, blood in the urine -signs of decreased red blood cells - unusually weak or tired, fainting spells, lightheadedness -breathing problems -confused, not responsive -chest pain -fast, irregular heartbeat -feeling faint or lightheaded, falls -mouth sores -redness, blistering, peeling or loosening of the skin, including inside the mouth -stomach pain -swelling of the ankles, feet, or hands -trouble passing urine or change in the amount of urine Side effects that usually do not require medical attention (report to your doctor or health care professional if they continue or are bothersome): -anxiety -headache -loss of appetite -muscle aches -nausea -night sweats This list may not describe all possible side effects. Call your doctor for medical advice about side effects. You may report side effects to FDA at 1-800-FDA-1088. Where should I keep my medicine? This drug is given in a hospital or clinic and will not be stored at home. NOTE: This sheet is a summary. It may not cover all possible information. If you have questions about this medicine, talk to your doctor, pharmacist, or health care provider.  2017 [employer] Standard  (2016-03-18 17:23:26) Doxorubicin injection What is this medicine? DOXORUBICIN (dox oh ROO bi sin) is a chemotherapy drug. It is used to treat many kinds of cancer like leukemia, lymphoma, neuroblastoma, sarcoma, and Wilms' tumor. It is also used to treat bladder cancer, breast cancer, lung cancer, ovarian cancer, stomach cancer, and thyroid cancer. This medicine may be used for other purposes; ask your health care provider or pharmacist if you have questions. COMMON BRAND NAME(S): Adriamycin, Adriamycin PFS, Adriamycin RDF, Rubex What should I tell my health care provider before I take this medicine? They need to know if you have any of these conditions: -heart disease -history of low blood counts caused by a medicine -liver disease -recent or ongoing radiation therapy -an unusual or allergic reaction to doxorubicin, other chemotherapy agents, other medicines, foods, dyes, or preservatives -pregnant or trying to get pregnant -breast-feeding How should I use this medicine? This drug is given as an infusion into a vein. It is administered in a hospital or clinic by a specially trained health care professional. If you have pain, swelling, burning or any unusual feeling around the site of your injection, tell your health care professional right away. Talk to your pediatrician regarding the use of this medicine in children. Special care may be needed. Overdosage: If you think you have taken too much of this medicine contact a poison control center or emergency room at once. NOTE: This medicine is only for you. Do not share this medicine with others. What if I miss a dose? It  is important not to miss your dose. Call your doctor or health care professional if you are unable to keep an appointment. What may interact with this medicine? This medicine may interact with the following medications: -6-mercaptopurine -paclitaxel -phenytoin -St. John's Wort -trastuzumab -verapamil This list may not  describe all possible interactions. Give your health care provider a list of all the medicines, herbs, non-prescription drugs, or dietary supplements you use. Also tell them if you smoke, drink alcohol, or use illegal drugs. Some items may interact with your medicine. What should I watch for while using this medicine? This drug may make you feel generally unwell. This is not uncommon, as chemotherapy can affect healthy cells as well as cancer cells. Report any side effects. Continue your course of treatment even though you feel ill unless your doctor tells you to stop. There is a maximum amount of this medicine you should receive throughout your life. The amount depends on the medical condition being treated and your overall health. Your doctor will watch how much of this medicine you receive in your lifetime. Tell your doctor if you have taken this medicine before. You may need blood work done while you are taking this medicine. Your urine may turn red for a few days after your dose. This is not blood. If your urine is dark or brown, call your doctor. In some cases, you may be given additional medicines to help with side effects. Follow all directions for their use. Call your doctor or health care professional for advice if you get a fever, chills or sore throat, or other symptoms of a cold or flu. Do not treat yourself. This drug decreases your body's ability to fight infections. Try to avoid being around people who are sick. This medicine may increase your risk to bruise or bleed. Call your doctor or health care professional if you notice any unusual bleeding. Talk to your doctor about your risk of cancer. You may be more at risk for certain types of cancers if you take this medicine. Do not become pregnant while taking this medicine or for 6 months after stopping it. Women should inform their doctor if they wish to become pregnant or think they might be pregnant. Men should not father a child while  taking this medicine and for 6 months after stopping it. There is a potential for serious side effects to an unborn child. Talk to your health care professional or pharmacist for more information. Do not breast-feed an infant while taking this medicine. This medicine has caused ovarian failure in some women and reduced sperm counts in some men This medicine may interfere with the ability to have a child. Talk with your doctor or health care professional if you are concerned about your fertility. What side effects may I notice from receiving this medicine? Side effects that you should report to your doctor or health care professional as soon as possible: -allergic reactions like skin rash, itching or hives, swelling of the face, lips, or tongue -breathing problems -chest pain -fast or irregular heartbeat -low blood counts - this medicine may decrease the number of white blood cells, red blood cells and platelets. You may be at increased risk for infections and bleeding. -pain, redness, or irritation at site where injected -signs of infection - fever or chills, cough, sore throat, pain or difficulty passing urine -signs of decreased platelets or bleeding - bruising, pinpoint red spots on the skin, black, tarry stools, blood in the urine -swelling of the ankles,  feet, hands -tiredness -weakness Side effects that usually do not require medical attention (report to your doctor or health care professional if they continue or are bothersome): -diarrhea -hair loss -mouth sores -nail discoloration or damage -nausea -red colored urine -vomiting This list may not describe all possible side effects. Call your doctor for medical advice about side effects. You may report side effects to FDA at 1-800-FDA-1088. Where should I keep my medicine? This drug is given in a hospital or clinic and will not be stored at home. NOTE: This sheet is a summary. It may not cover all possible information. If you have  questions about this medicine, talk to your doctor, pharmacist, or health care provider.  2017 [employer]/Gold Standard (2015-11-03 11:28:51) Vincristine injection What is this medicine? VINCRISTINE (vin KRIS teen) is a chemotherapy drug. It slows the growth of cancer cells. This medicine is used to treat many types of cancer like Hodgkin's disease, leukemia, non-Hodgkin's lymphoma, neuroblastoma (brain cancer), rhabdomyosarcoma, and Wilms' tumor. This medicine may be used for other purposes; ask your health care provider or pharmacist if you have questions. COMMON BRAND NAME(S): Oncovin, Vincasar PFS What should I tell my health care provider before I take this medicine? They need to know if you have any of these conditions: -blood disorders -gout -infection (especially chickenpox, cold sores, or herpes) -kidney disease -liver disease -lung disease -nervous system disease like Charcot-Marie-Tooth (CMT) -recent or ongoing radiation therapy -an unusual or allergic reaction to vincristine, other chemotherapy agents, other medicines, foods, dyes, or preservatives -pregnant or trying to get pregnant -breast-feeding How should I use this medicine? This drug is given as an infusion into a vein. It is administered in a hospital or clinic by a specially trained health care professional. If you have pain, swelling, burning, or any unusual feeling around the site of your injection, tell your health care professional right away. Talk to your pediatrician regarding the use of this medicine in children. While this drug may be prescribed for selected conditions, precautions do apply. Overdosage: If you think you have taken too much of this medicine contact a poison control center or emergency room at once. NOTE: This medicine is only for you. Do not share this medicine with others. What if I miss a dose? It is important not to miss your dose. Call your doctor or health care professional if you are unable  to keep an appointment. What may interact with this medicine? Do not take this medicine with any of the following medications: -itraconazole -mibefradil -voriconazole This medicine may also interact with the following medications: -cyclosporine -erythromycin -fluconazole -ketoconazole -medicines for HIV like delavirdine, efavirenz, nevirapine -medicines for seizures like ethotoin, fosphenotoin, phenytoin -medicines to increase blood counts like filgrastim, pegfilgrastim, sargramostim -other chemotherapy drugs like cisplatin, L-asparaginase, methotrexate, mitomycin, paclitaxel -pegaspargase -vaccines -zalcitabine, ddC Talk to your doctor or health care professional before taking any of these medicines: -acetaminophen -aspirin -ibuprofen -ketoprofen -naproxen This list may not describe all possible interactions. Give your health care provider a list of all the medicines, herbs, non-prescription drugs, or dietary supplements you use. Also tell them if you smoke, drink alcohol, or use illegal drugs. Some items may interact with your medicine. What should I watch for while using this medicine? Your condition will be monitored carefully while you are receiving this medicine. You will need important blood work done while you are taking this medicine. This drug may make you feel generally unwell. This is not uncommon, as chemotherapy can affect healthy cells as  well as cancer cells. Report any side effects. Continue your course of treatment even though you feel ill unless your doctor tells you to stop. In some cases, you may be given additional medicines to help with side effects. Follow all directions for their use. Call your doctor or health care professional for advice if you get a fever, chills or sore throat, or other symptoms of a cold or flu. Do not treat yourself. Avoid taking products that contain aspirin, acetaminophen, ibuprofen, naproxen, or ketoprofen unless instructed by your  doctor. These medicines may hide a fever. Do not become pregnant while taking this medicine. Women should inform their doctor if they wish to become pregnant or think they might be pregnant. There is a potential for serious side effects to an unborn child. Talk to your health care professional or pharmacist for more information. Do not breast-feed an infant while taking this medicine. Men may have a lower sperm count while taking this medicine. Talk to your doctor if you plan to father a child. What side effects may I notice from receiving this medicine? Side effects that you should report to your doctor or health care professional as soon as possible: -allergic reactions like skin rash, itching or hives, swelling of the face, lips, or tongue -breathing problems -confusion or changes in emotions or moods -constipation -cough -mouth sores -muscle weakness -nausea and vomiting -pain, swelling, redness or irritation at the injection site -pain, tingling, numbness in the hands or feet -problems with balance, talking, walking -seizures -stomach pain -trouble passing urine or change in the amount of urine Side effects that usually do not require medical attention (report to your doctor or health care professional if they continue or are bothersome): -diarrhea -hair loss -jaw pain -loss of appetite This list may not describe all possible side effects. Call your doctor for medical advice about side effects. You may report side effects to FDA at 1-800-FDA-1088. Where should I keep my medicine? This drug is given in a hospital or clinic and will not be stored at home. NOTE: This sheet is a summary. It may not cover all possible information. If you have questions about this medicine, talk to your doctor, pharmacist, or health care provider.  2017 [employer]/Gold Standard (2008-06-03 17:17:13) Cyclophosphamide injection What is this medicine? CYCLOPHOSPHAMIDE (sye kloe FOSS fa mide) is a  chemotherapy drug. It slows the growth of cancer cells. This medicine is used to treat many types of cancer like lymphoma, myeloma, leukemia, breast cancer, and ovarian cancer, to name a few. This medicine may be used for other purposes; ask your health care provider or pharmacist if you have questions. COMMON BRAND NAME(S): Cytoxan, Neosar What should I tell my health care provider before I take this medicine? They need to know if you have any of these conditions: -blood disorders -history of other chemotherapy -infection -kidney disease -liver disease -recent or ongoing radiation therapy -tumors in the bone marrow -an unusual or allergic reaction to cyclophosphamide, other chemotherapy, other medicines, foods, dyes, or preservatives -pregnant or trying to get pregnant -breast-feeding How should I use this medicine? This drug is usually given as an injection into a vein or muscle or by infusion into a vein. It is administered in a hospital or clinic by a specially trained health care professional. Talk to your pediatrician regarding the use of this medicine in children. Special care may be needed. Overdosage: If you think you have taken too much of this medicine contact a poison control center or  emergency room at once. NOTE: This medicine is only for you. Do not share this medicine with others. What if I miss a dose? It is important not to miss your dose. Call your doctor or health care professional if you are unable to keep an appointment. What may interact with this medicine? This medicine may interact with the following medications: -amiodarone -amphotericin B -azathioprine -certain antiviral medicines for HIV or AIDS such as protease inhibitors (e.g., indinavir, ritonavir) and zidovudine -certain blood pressure medications such as benazepril, captopril, enalapril, fosinopril, lisinopril, moexipril, monopril, perindopril, quinapril, ramipril, trandolapril -certain cancer medications  such as anthracyclines (e.g., daunorubicin, doxorubicin), busulfan, cytarabine, paclitaxel, pentostatin, tamoxifen, trastuzumab -certain diuretics such as chlorothiazide, chlorthalidone, hydrochlorothiazide, indapamide, metolazone -certain medicines that treat or prevent blood clots like warfarin -certain muscle relaxants such as succinylcholine -cyclosporine -etanercept -indomethacin -medicines to increase blood counts like filgrastim, pegfilgrastim, sargramostim -medicines used as general anesthesia -metronidazole -natalizumab This list may not describe all possible interactions. Give your health care provider a list of all the medicines, herbs, non-prescription drugs, or dietary supplements you use. Also tell them if you smoke, drink alcohol, or use illegal drugs. Some items may interact with your medicine. What should I watch for while using this medicine? Visit your doctor for checks on your progress. This drug may make you feel generally unwell. This is not uncommon, as chemotherapy can affect healthy cells as well as cancer cells. Report any side effects. Continue your course of treatment even though you feel ill unless your doctor tells you to stop. Drink water or other fluids as directed. Urinate often, even at night. In some cases, you may be given additional medicines to help with side effects. Follow all directions for their use. Call your doctor or health care professional for advice if you get a fever, chills or sore throat, or other symptoms of a cold or flu. Do not treat yourself. This drug decreases your body's ability to fight infections. Try to avoid being around people who are sick. This medicine may increase your risk to bruise or bleed. Call your doctor or health care professional if you notice any unusual bleeding. Be careful brushing and flossing your teeth or using a toothpick because you may get an infection or bleed more easily. If you have any dental work done, tell your  dentist you are receiving this medicine. You may get drowsy or dizzy. Do not drive, use machinery, or do anything that needs mental alertness until you know how this medicine affects you. Do not become pregnant while taking this medicine or for 1 year after stopping it. Women should inform their doctor if they wish to become pregnant or think they might be pregnant. Men should not father a child while taking this medicine and for 4 months after stopping it. There is a potential for serious side effects to an unborn child. Talk to your health care professional or pharmacist for more information. Do not breast-feed an infant while taking this medicine. This medicine may interfere with the ability to have a child. This medicine has caused ovarian failure in some women. This medicine has caused reduced sperm counts in some men. You should talk with your doctor or health care professional if you are concerned about your fertility. If you are going to have surgery, tell your doctor or health care professional that you have taken this medicine. What side effects may I notice from receiving this medicine? Side effects that you should report to your doctor or health  care professional as soon as possible: -allergic reactions like skin rash, itching or hives, swelling of the face, lips, or tongue -low blood counts - this medicine may decrease the number of white blood cells, red blood cells and platelets. You may be at increased risk for infections and bleeding. -signs of infection - fever or chills, cough, sore throat, pain or difficulty passing urine -signs of decreased platelets or bleeding - bruising, pinpoint red spots on the skin, black, tarry stools, blood in the urine -signs of decreased red blood cells - unusually weak or tired, fainting spells, lightheadedness -breathing problems -dark urine -dizziness -palpitations -swelling of the ankles, feet, hands -trouble passing urine or change in the amount  of urine -weight gain -yellowing of the eyes or skin Side effects that usually do not require medical attention (report to your doctor or health care professional if they continue or are bothersome): -changes in nail or skin color -hair loss -missed menstrual periods -mouth sores -nausea, vomiting This list may not describe all possible side effects. Call your doctor for medical advice about side effects. You may report side effects to FDA at 1-800-FDA-1088. Where should I keep my medicine? This drug is given in a hospital or clinic and will not be stored at home. NOTE: This sheet is a summary. It may not cover all possible information. If you have questions about this medicine, talk to your doctor, pharmacist, or health care provider.  2017 [employer] Standard (2012-07-21 16:22:58)

## 2016-11-03 ENCOUNTER — Ambulatory Visit
Admission: RE | Admit: 2016-11-03 | Discharge: 2016-11-03 | Disposition: A | Discharge status: Home / Self Care | Payer: Managed Care, Other (non HMO) | Source: Ambulatory Visit | Attending: Oncology | Admitting: Oncology

## 2016-11-03 ENCOUNTER — Telehealth: Payer: Self-pay | Admitting: *Deleted

## 2016-11-03 ENCOUNTER — Other Ambulatory Visit (HOSPITAL_COMMUNITY)
Admission: RE | Admit: 2016-11-03 | Discharge: 2016-11-03 | Disposition: A | Discharge status: Home / Self Care | Payer: Managed Care, Other (non HMO) | Source: Ambulatory Visit | Attending: Oncology | Admitting: Oncology

## 2016-11-03 ENCOUNTER — Other Ambulatory Visit (HOSPITAL_COMMUNITY)
Admission: RE | Admit: 2016-11-03 | Disposition: A | Discharge status: Home / Self Care | Payer: Managed Care, Other (non HMO) | Source: Other Acute Inpatient Hospital | Attending: Oncology | Admitting: Oncology

## 2016-11-03 ENCOUNTER — Ambulatory Visit: Admission: RE | Admit: 2016-11-03 | Payer: Managed Care, Other (non HMO) | Source: Ambulatory Visit

## 2016-11-03 DIAGNOSIS — C8330 Diffuse large B-cell lymphoma, unspecified site: Secondary | ICD-10-CM | POA: Diagnosis present

## 2016-11-03 DIAGNOSIS — C8580 Other specified types of non-Hodgkin lymphoma, unspecified site: Secondary | ICD-10-CM

## 2016-11-03 DIAGNOSIS — C8331 Diffuse large B-cell lymphoma, lymph nodes of head, face, and neck: Secondary | ICD-10-CM | POA: Insufficient documentation

## 2016-11-03 HISTORY — DX: Malignant (primary) neoplasm, unspecified: C80.1

## 2016-11-03 HISTORY — PX: IR GENERIC HISTORICAL: IMG1180011

## 2016-11-03 LAB — BASIC METABOLIC PANEL
ANION GAP: 6 (ref 5–15)
BUN: 11 mg/dL (ref 6–20)
CO2: 26 mmol/L (ref 22–32)
Calcium: 8.8 mg/dL — ABNORMAL LOW (ref 8.9–10.3)
Chloride: 108 mmol/L (ref 101–111)
Creatinine, Ser: 0.74 mg/dL (ref 0.44–1.00)
GFR calc Af Amer: >60 mL/min (ref >60)
GLUCOSE: 104 mg/dL — AB (ref 65–99)
POTASSIUM: 4 mmol/L (ref 3.5–5.1)
Sodium: 140 mmol/L (ref 135–145)

## 2016-11-03 LAB — APTT: aPTT: 28 seconds (ref 24–36)

## 2016-11-03 LAB — CBC
HCT: 35.3 % (ref 35.0–47.0)
Hemoglobin: 12.1 g/dL (ref 12.0–16.0)
MCH: 29.1 pg (ref 26.0–34.0)
MCHC: 34.3 g/dL (ref 32.0–36.0)
MCV: 84.9 fL (ref 80.0–100.0)
Platelets: 283 10*3/uL (ref 150–440)
RBC: 4.16 MIL/uL (ref 3.80–5.20)
RDW: 12.6 % (ref 11.5–14.5)
WBC: 4.4 10*3/uL (ref 3.6–11.0)

## 2016-11-03 LAB — PROTIME-INR
INR: 0.96
Prothrombin Time: 12.8 seconds (ref 11.4–15.2)

## 2016-11-03 MED ORDER — MIDAZOLAM HCL 2 MG/2ML IJ SOLN
INTRAMUSCULAR | Status: AC | PRN
  Administered 2016-11-03 (×2): 1 mg via INTRAVENOUS

## 2016-11-03 MED ORDER — MIDAZOLAM HCL 2 MG/2ML IJ SOLN
INTRAMUSCULAR | Status: AC | PRN
  Administered 2016-11-03: 1 mg via INTRAVENOUS

## 2016-11-03 MED ORDER — CEFAZOLIN SODIUM-DEXTROSE 2-4 GM/100ML-% IV SOLN
2.0000 g | INTRAVENOUS | Status: AC
  Administered 2016-11-03: 12:00:00 2 g via INTRAVENOUS
  Filled 2016-11-03: qty 100

## 2016-11-03 MED ORDER — FENTANYL CITRATE (PF) 100 MCG/2ML IJ SOLN
INTRAMUSCULAR | Status: AC | PRN
  Administered 2016-11-03 (×2): 50 ug via INTRAVENOUS

## 2016-11-03 MED ORDER — SODIUM CHLORIDE 0.9 % IV SOLN
INTRAVENOUS | Status: DC
  Administered 2016-11-03: 11:00:00 via INTRAVENOUS

## 2016-11-03 MED ORDER — FENTANYL CITRATE (PF) 100 MCG/2ML IJ SOLN
INTRAMUSCULAR | Status: AC | PRN
  Administered 2016-11-03: 50 ug via INTRAVENOUS

## 2016-11-03 NOTE — Procedures (Signed)
Interventional Radiology Procedure Note  Procedure: Placement of a right IJ approach single lumen PowerPort.  Tip is positioned at the superior cavoatrial junction and catheter is ready for immediate use.  Complications: No immediate Recommendations:  - Ok to shower tomorrow - Do not submerge for 7 days - Routine line care   Christina [name], M.D Pager:  [phone]   

## 2016-11-03 NOTE — OR Nursing (Signed)
Dr Kathlene Cote assessed right chest portacath site, requested it to be covered with gauze and tegaderm. Ok to discharge. Pt given ice bag to use prn.

## 2016-11-03 NOTE — OR Nursing (Signed)
Ice pack to swolllen, red burning right upper chest portacath site.

## 2016-11-03 NOTE — Discharge Instructions (Signed)
Implanted Port Insertion, Care After Refer to this sheet in the next few weeks. These instructions provide you with information on caring for yourself after your procedure. Your health care provider may also give you more specific instructions. Your treatment has been planned according to current medical practices, but problems sometimes occur. Call your health care provider if you have any problems or questions after your procedure. WHAT TO EXPECT AFTER THE PROCEDURE After your procedure, it is typical to have the following:   Discomfort at the port insertion site. Ice packs to the area will help.  Bruising on the skin over the port. This will subside in 3-4 days. HOME CARE INSTRUCTIONS  After your port is placed, you will get a manufacturer's information card. The card has information about your port. Keep this card with you at all times.   Know what kind of port you have. There are many types of ports available.   Wear a medical alert bracelet in case of an emergency. This can help alert health care workers that you have a port.   The port can stay in for as long as your health care provider believes it is necessary.   A home health care nurse may give medicines and take care of the port.   You or a family member can get special training and directions for giving medicine and taking care of the port at home.  SEEK MEDICAL CARE IF:   Your port does not flush or you are unable to get a blood return.   You have a fever or chills. SEEK IMMEDIATE MEDICAL CARE IF:  You have new fluid or pus coming from your incision.   You notice a bad smell coming from your incision site.   You have swelling, pain, or more redness at the incision or port site.   You have chest pain or shortness of breath. This information is not intended to replace advice given to you by your health care provider. Make sure you discuss any questions you have with your health care provider. Document  Released: 06/27/2013 Document Revised: 09/11/2013 Document Reviewed: 06/27/2013 Elsevier Interactive Patient Education  2017 [place]. Needle Biopsy of the Bone Introduction A bone biopsy is a procedure in which a small sample of bone is removed. The sample is taken with a needle. Then, the bone sample is looked at under a microscope to check for abnormalities. The sample is usually taken from a bone that is close to the skin. This procedure may be done to check for various problems with the bone. You may need this procedure if imaging tests or blood tests have indicated a possible problem. This procedure may be done to help determine if a bone tumor is cancerous (malignant). A bone biopsy can help to diagnose problems such as:  Tumors of the bone (sarcomas) and bone marrow (multiple myeloma).  Bone that forms abnormally (Paget disease).  Noncancerous (benign) bone cysts.  Bony growths.  Infections in the bone. Tell a health care provider about:  Any allergies you have.  All medicines you are taking, including vitamins, herbs, eye drops, creams, and over-the-counter medicines.  Any problems you or family members have had with anesthetic medicines.  Any blood disorders you have.  Any surgeries you have had.  Any medical conditions you have. What are the risks? Generally, this is a safe procedure. However, problems may occur, including:  Excessive bleeding.  Infection.  Injury to surrounding tissue. What happens before the procedure?  Ask  your health care provider about:  Changing or stopping your regular medicines. This is especially important if you are taking diabetes medicines or blood thinners.  Taking medicines such as aspirin and ibuprofen. These medicines can thin your blood. Do not take these medicines before your procedure if your health care provider instructs you not to.  Follow instructions from your health care provider about eating or drinking  restrictions.  Plan to have someone take you home after the procedure.  If you go home right after the procedure, plan to have someone with you for 24 hours. What happens during the procedure?  An IV tube may be inserted into one of your veins.  The injection site will be cleaned with a germ-killing solution (antiseptic).  You will be given one or more of the following:  A medicine to help you relax (sedative).  A medicine to numb the area (local anesthetic).  The sample of bone will be removed by putting a large needle through the skin and into the bone.  The needle will be removed.  A bandage (dressing) will be placed over the insertion site and taped in place. The procedure may vary among health care providers and hospitals. What happens after the procedure?  Your blood pressure, heart rate, breathing rate, and blood oxygen level will be monitored often until the medicines you were given have worn off.  Return to your normal activities as told by your health care provider. This information is not intended to replace advice given to you by your health care provider. Make sure you discuss any questions you have with your health care provider. Document Released: 07/15/2004 Document Revised: 02/12/2016 Document Reviewed: 10/14/2014  2017 Elsevier

## 2016-11-03 NOTE — Procedures (Signed)
Interventional Radiology Procedure Note  Procedure: CT guided aspirate and core biopsy of right iliac bone Complications: None Recommendations: - Bedrest supine x 1 hr - Follow biopsy results  Shonte Soderlund T. Aneesh Faller, M.D Pager:  [phone]   

## 2016-11-04 ENCOUNTER — Other Ambulatory Visit: Payer: Self-pay | Admitting: Oncology

## 2016-11-04 ENCOUNTER — Inpatient Hospital Stay: Payer: Managed Care, Other (non HMO)

## 2016-11-04 ENCOUNTER — Telehealth: Payer: Self-pay | Admitting: *Deleted

## 2016-11-04 ENCOUNTER — Encounter: Payer: Self-pay | Admitting: *Deleted

## 2016-11-04 NOTE — Telephone Encounter (Signed)
Pt's significant other needs note because they had planned a vacation and need to cancel because of the dx and chemotherapy she will be on. They had bought airline tickets. I told him that I will type  aletter but pt may need a form to get refund from airport.  He wants me to send letter to email [email or web address].  I will send today

## 2016-11-04 NOTE — OR Nursing (Signed)
Pt has two acct numbers for same day visit, pre and post documentation may be on csn [id number]

## 2016-11-04 NOTE — Progress Notes (Signed)
Spoke with patient-BMB site-only slightly sore-per patient.  Port-a-cath dressing removed-site "a little red and puffy". States it doesn't look like infection.  Will talk with pt. Tomorrow.

## 2016-11-05 NOTE — Telephone Encounter (Signed)
I called Ronalee Belts back stating I did the letter but I was having trouble with the  email address and I was missing a letter so I resent and it worked. Also I had got a call about Jannette did not make her teaching appt today.  He said he reminded her about it for she went to to work. I told him the scheduling staff called her to reschedule and then she told the staff that she did not know anything about the pet or ECHO.  I had spoke to [place] about and he states that he did tell her about theses appts and put them on the calendar also. I have to change the class of chemo to 2/20 from 9-12 at cancer center, then changed ECHO to 2/21 arrive 10:45 and the procedure 11 am. Then her chemo will now be 2/22 arrive 8:30.  She will have labs, see md , then get chemo. Ronalee Belts wrote all of this down.  I told him that I tried calling her cell phone first but there was no answer.

## 2016-11-07 ENCOUNTER — Other Ambulatory Visit: Payer: Self-pay | Admitting: *Deleted

## 2016-11-07 DIAGNOSIS — C8331 Diffuse large B-cell lymphoma, lymph nodes of head, face, and neck: Secondary | ICD-10-CM

## 2016-11-07 MED ORDER — LIDOCAINE-PRILOCAINE 2.5-2.5 % EX CREA: TOPICAL_CREAM | CUTANEOUS | 1 refills | Status: DC

## 2016-11-08 ENCOUNTER — Ambulatory Visit
Admission: RE | Admit: 2016-11-08 | Discharge: 2016-11-08 | Disposition: A | Discharge status: Home / Self Care | Payer: Managed Care, Other (non HMO) | Source: Ambulatory Visit | Attending: Oncology | Admitting: Oncology

## 2016-11-08 DIAGNOSIS — C8331 Diffuse large B-cell lymphoma, lymph nodes of head, face, and neck: Secondary | ICD-10-CM | POA: Diagnosis not present

## 2016-11-08 LAB — GLUCOSE, CAPILLARY: Glucose-Capillary: 94 mg/dL (ref 65–99)

## 2016-11-08 MED ORDER — FLUDEOXYGLUCOSE F - 18 (FDG) INJECTION
12.0000 | Freq: Once | INTRAVENOUS | Status: AC | PRN
  Administered 2016-11-08: 11.69 via INTRAVENOUS

## 2016-11-09 ENCOUNTER — Ambulatory Visit: Payer: Managed Care, Other (non HMO)

## 2016-11-09 ENCOUNTER — Ambulatory Visit (INDEPENDENT_AMBULATORY_CARE_PROVIDER_SITE_OTHER): Payer: Managed Care, Other (non HMO) | Admitting: Gastroenterology

## 2016-11-09 ENCOUNTER — Encounter: Payer: Self-pay | Admitting: Gastroenterology

## 2016-11-09 ENCOUNTER — Other Ambulatory Visit: Payer: Self-pay | Admitting: Oncology

## 2016-11-09 ENCOUNTER — Inpatient Hospital Stay: Payer: Managed Care, Other (non HMO)

## 2016-11-09 VITALS — BP 110/72 | HR 68 | Ht 66.0 in | Wt 163.0 lb

## 2016-11-09 DIAGNOSIS — R933 Abnormal findings on diagnostic imaging of other parts of digestive tract: Secondary | ICD-10-CM

## 2016-11-09 NOTE — Progress Notes (Addendum)
Gastroenterology Consultation  Referring Provider:     Guadalupe Maple, MD Primary Care Physician:  Golden Pop, MD Primary Gastroenterologist:  Dr. Jonathon Bellows  Reason for Consultation:     Abnormal PET scan         HPI:   Christina [name] is a 56 y.o. y/o female .She has been referred by Dr Janese Banks for an abnormal CT scan for further evaluation with an EGD. She is presently undergoing evaluation with Oncology for a swelling in the right side of the neck. 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 A PET CT scan was performed yesterday which showed a focal uptake in the gastric cardia and hence an EGD has been requested.   She denies any abdominal pain , early satiety, dyspeptic symptoms. No weight loss or night sweats.  Past Medical History:  Diagnosis Date  . Cancer ([place])    lymphoma, non hodgekins B cell    Past Surgical History:  Procedure Laterality Date  . ABDOMINAL HYSTERECTOMY    . CESAREAN SECTION     x 2   . IR GENERIC HISTORICAL  11/03/2016   IR FLUORO GUIDE PORT INSERTION RIGHT 11/03/2016 Aletta Edouard, MD [facility]-INTERV RAD  . REFRACTIVE SURGERY  09/2015    Prior to Admission medications   Medication Sig Start Date End Date Taking? Authorizing Provider  estradiol (CLIMARA - DOSED IN MG/24 HR) 0.05 mg/24hr patch once a week. 08/30/16  Yes Historical Provider, MD  lidocaine-prilocaine (EMLA) cream Apply cream 1 hour before chemotherapy treatment, place a small piece of saran wrap over cream to protect clothing 11/07/16  Yes Sindy Guadeloupe, MD    Family History  Problem Relation Age of Onset  . Cancer Mother     breast  . Cancer Father     lung  . Heart disease Brother 55    MI  . Heart disease Paternal Grandfather     MI     Social History  Substance Use Topics  . Smoking status: Never Smoker  . Smokeless tobacco: Never Used  . Alcohol use No    Allergies as of 11/09/2016  . (No Known Allergies)    Review of Systems:    All systems reviewed  and negative except where noted in HPI.   Physical Exam:  BP 110/72   Pulse 68   Ht '5\' 6"'  (1.676 m)   Wt 163 lb (73.9 kg)   LMP  (LMP Unknown)   BMI 26.31 kg/m  No LMP recorded (lmp unknown). Patient has had a hysterectomy. Psych:  Alert and cooperative. Normal mood and affect. General:   Alert,  Well-developed, well-nourished, pleasant and cooperative in NAD Head:  Normocephalic and atraumatic. Eyes:  Sclera clear, no icterus.   Conjunctiva pink. Ears:  Normal auditory acuity. Nose:  No deformity, discharge, or lesions. Mouth:  No deformity or lesions,oropharynx pink & moist. Neck:  Enlarged lymph nodes right side of the neck . Lungs:  Chest : port seen below rt clavicle. Respirations even and unlabored.  Clear throughout to auscultation.   No wheezes, crackles, or rhonchi. No acute distress. Heart:  Regular rate and rhythm; no murmurs, clicks, rubs, or gallops. Abdomen:  Normal bowel sounds.  No bruits.  Soft, non-tender and non-distended without masses, hepatosplenomegaly or hernias noted.  No guarding or rebound tenderness.    Msk:  Symmetrical without gross deformities. Good, equal movement & strength bilaterally. Lymph Nodes:  No significant cervical adenopathy. Psych:  Alert and cooperative.  Normal mood and affect.  Imaging Studies: Nm Pet Image Initial (pi) Skull Base To Thigh  Result Date: 11/08/2016 CLINICAL DATA:  Diffuse large B-cell lymphoma in the neck. Initial staging. Recent port placement. Biopsy of a right neck lymph node October 18, 2016. Bone marrow biopsy in the right pelvis on November 03, 2016. EXAM: NUCLEAR MEDICINE PET SKULL BASE TO THIGH TECHNIQUE: 11.69 mCi F-18 FDG was injected intravenously. Full-ring PET imaging was performed from the skull base to thigh after the radiotracer. CT data was obtained and used for attenuation correction and anatomic localization. FASTING BLOOD GLUCOSE:  Value: 94 mg/dl COMPARISON:  CT of the neck October 01, 2016 FINDINGS: NECK  Again noted are abnormal lymph nodes in the level 2 and 3 right cervical nodal stations, involving a and b sub sets at these levels. The lymph nodes were better measure on the recent contrast-enhanced CT scan. The index node on the right on series 3, image 25 correlates with the 2.2 cm node on the recent CT scan and demonstrates a maximum SUV of 18.6. The 7 mm node along the lowest right level IIIb station on the recent CT scan is not FDG avid. No FDG avid disease is seen at levels 1, 4, 5, 6, or 7. CHEST No hypermetabolic mediastinal or hilar nodes. No suspicious pulmonary nodules on the CT scan. ABDOMEN/PELVIS There is focal uptake centered in the gastric cardia with a maximum SUV of 8.5. No abnormal uptake in the spleen. No FDG-avid adenopathy seen within the abdomen or pelvis. No other FDG avid disease in the abdomen or pelvis. SKELETON No focal hypermetabolic activity to suggest skeletal metastasis. IMPRESSION: 1. FDG avid malignancy is identified in the level 2 and 3 nodal stations of the right cervical lymph node chain, consistent with the patient's known lymphoma. 2. Focal uptake in the gastric cardia is suspicious for malignancy as well. Recommend direct visualization and biopsy as clinically warranted. Electronically Signed   By: Dorise Bullion III M.D   On: 11/08/2016 13:47   US Biopsy  Result Date: 10/18/2016 INDICATION: Multiple right cervical lymphadenopathy EXAM: ULTRASOUND BIOPSY CERVICAL LYMPHADENOPATHY MEDICATIONS: None. ANESTHESIA/SEDATION: None FLUOROSCOPY TIME:  Not applicable COMPLICATIONS: None immediate. PROCEDURE: Informed written consent was obtained from the patient after a thorough discussion of the procedural risks, benefits and alternatives. All questions were addressed. Maximal Sterile Barrier Technique was utilized including caps, mask, sterile gowns, sterile gloves, sterile drape, hand hygiene and skin antiseptic. A timeout was performed prior to the initiation of the procedure.  Utilizing 0.25% Marcaine as a local and deep tissue anesthetic and real-time ultrasound guidance a 15 gauge guiding needle was placed percutaneously into the right neck adjacent to 1 of the abnormally enlarged lymph nodes beneath the sternocleidomastoid muscle. Dedicated ultrasound imaging was obtained during needle placement. Imaging was also obtained during core biopsy. Multiple 16 gauge core biopsies were then obtained. The initial biopsy was sent for touch prep evaluation and felt to be adequate as determined by the pathologist. Three more sizable cores were obtained and sent for pathologic evaluation. The guiding needle was then removed and hemostasis obtained at the puncture site. Sterile dressing was applied. The patient tolerated the procedure well and was returned her room in satisfactory condition. IMPRESSION: Successful ultrasound-guided biopsy of right cervical lymphadenopathy. The initial touch prep sample was deemed adequate by the pathologist. Electronically Signed   By: Inez Catalina M.D.   On: 10/18/2016 09:31   Ct Bone Marrow Biopsy & Aspiration  Result Date: 11/03/2016  CLINICAL DATA:  Diffuse large B-cell lymphoma of neck. The patient requires bone marrow biopsy. EXAM: CT GUIDED BONE MARROW ASPIRATION AND BIOPSY ANESTHESIA/SEDATION: Versed 2.0 mg IV, Fentanyl 100 mcg IV Total Moderate Sedation Time:  16 minutes. The patient's level of consciousness and physiologic status were continuously monitored during the procedure by Radiology nursing. PROCEDURE: The procedure risks, benefits, and alternatives were explained to the patient. Questions regarding the procedure were encouraged and answered. The patient understands and consents to the procedure. A time out was performed prior to initiating the procedure. The right gluteal region was prepped with chlorhexidine. Sterile gown and sterile gloves were used for the procedure. Local anesthesia was provided with 1% Lidocaine. Under CT guidance, an 11  gauge On Control bone cutting needle was advanced from a posterior approach into the right iliac bone. Needle positioning was confirmed with CT. Initial non heparinized and heparinized aspirate samples were obtained of bone marrow. Core biopsy was performed via the On Control drill needle. COMPLICATIONS: None FINDINGS: Inspection of initial aspirate did reveal visible particles. Intact core biopsy sample was obtained. IMPRESSION: CT guided bone marrow biopsy of right posterior iliac bone with both aspirate and core samples obtained. Electronically Signed   By: Aletta Edouard M.D.   On: 11/03/2016 15:41   Ir Fluoro Guide Port Insertion Right  Result Date: 11/03/2016 CLINICAL DATA:  Diffuse large B-cell lymphoma. The patient requires a porta cath to begin chemotherapy. EXAM: IMPLANTED PORT A CATH PLACEMENT WITH ULTRASOUND AND FLUOROSCOPIC GUIDANCE ANESTHESIA/SEDATION: 1.0 mg IV Versed; 50 mcg IV Fentanyl Total Moderate Sedation Time:  52 minutes The patient's level of consciousness and physiologic status were continuously monitored during the procedure by Radiology nursing. Additional Medications: 2 g IV Ancef. As antibiotic prophylaxis, Ancef was ordered pre-procedure and administered intravenously within one hour of incision. FLUOROSCOPY TIME:  36 seconds.  5.0 mGy. PROCEDURE: The procedure, risks, benefits, and alternatives were explained to the patient. Questions regarding the procedure were encouraged and answered. The patient understands and consents to the procedure. A time-out was performed prior to initiating the procedure. Ultrasound was utilized to confirm patency of the right internal jugular vein. The right neck and chest were prepped with chlorhexidine in a sterile fashion, and a sterile drape was applied covering the operative field. Maximum barrier sterile technique with sterile gowns and gloves were used for the procedure. Local anesthesia was provided with 1% lidocaine. After creating a small  venotomy incision, a 21 gauge needle was advanced into the right internal jugular vein under direct, real-time ultrasound guidance. Ultrasound image documentation was performed. After securing guidewire access, an 8 Fr dilator was placed. A J-wire was kinked to measure appropriate catheter length. A subcutaneous port pocket was then created along the upper chest wall utilizing sharp and blunt dissection. Portable cautery was utilized. The pocket was irrigated with sterile saline. A single lumen power injectable port was chosen for placement. The 8 Fr catheter was tunneled from the port pocket site to the venotomy incision. The port was placed in the pocket. External catheter was trimmed to appropriate length based on guidewire measurement. At the venotomy, an 8 Fr peel-away sheath was placed over a guidewire. The catheter was then placed through the sheath and the sheath removed. Final catheter positioning was confirmed and documented with a fluoroscopic spot image. The port was accessed with a needle and aspirated and flushed with heparinized saline. The access needle was removed. The venotomy and port pocket incisions were closed with subcutaneous 4-0 Monocryl  and subcuticular 4-0 Monocryl. Dermabond was applied to both incisions. COMPLICATIONS: COMPLICATIONS None FINDINGS: After catheter placement, the tip lies at the cavo-atrial junction. The catheter aspirates normally and is ready for immediate use. IMPRESSION: Placement of single lumen port a cath via right internal jugular vein. The catheter tip lies at the cavo-atrial junction. A power injectable port a cath was placed and is ready for immediate use. Electronically Signed   By: Aletta Edouard M.D.   On: 11/03/2016 15:42    Assessment and Plan:    ZAKAYLA MARTINEC 56 y.o. female here today for evaluation of an abnormal uptake area seen on PET scan yesterday in the gastric Cardia. She has been diagnosed with diffuse B cell lymphoma. I will plan for an  EGD day after tomorrow at 7.30 am .   F/u as needed   Dr Jonathon Bellows  Gastroenterology/Hepatology Pager: [phone]

## 2016-11-09 NOTE — Progress Notes (Signed)
START ON PATHWAY REGIMEN - Lymphoma and CLL  LYOS217: R-CHOP q21 Days x 3 Cycles with IFRT After the Completion of 3 Cycles    A cycle is every 21 days:     Rituximab (Rituxan(R)) 375 mg/m2 in _____ mL NS IV day 1 only.       Dose Mod: None     Cyclophosphamide (Cytoxan(R)) 750 mg/m2 in 250 mL NS IV over 30 minutes day 1 only       Dose Mod: None     Doxorubicin (Adriamycin(R)) 50 mg/m2 IV push day 1 only       Dose Mod: None     Vincristine (Oncovin(R)) 1.4 mg/m2; MAX 2 mg in 50 mL normal saline IV over 20 minutes on day 1 only. (MAX DOSE 2 mg)       Dose Mod: None     Prednisone 100 mg flat dose orally daily days 1,2,3,4,5.       Dose Mod: None         Additional Orders: Hepatitis B&C testing recommended prior to rituximab use on all patients. Final concentration of rituximab must be between 1 and 4 mg/mL.  **Always confirm dose/schedule in your pharmacy ordering system**    Patient Characteristics: Diffuse Large B Cell, First Line, Stage I and II, No Bulk Disease Type: Not Applicable Disease Type: Diffuse Large B Cell Line of therapy: First Line [place] Stage: Unknown Disease Characteristics: No Bulk  Intent of Therapy: Curative Intent, Discussed with Patient

## 2016-11-10 ENCOUNTER — Encounter: Payer: Managed Care, Other (non HMO) | Admitting: Unknown Physician Specialty

## 2016-11-10 ENCOUNTER — Other Ambulatory Visit: Payer: Self-pay

## 2016-11-10 ENCOUNTER — Telehealth: Payer: Self-pay

## 2016-11-10 ENCOUNTER — Ambulatory Visit
Admission: RE | Admit: 2016-11-10 | Discharge: 2016-11-10 | Disposition: A | Discharge status: Home / Self Care | Payer: Managed Care, Other (non HMO) | Source: Ambulatory Visit | Attending: Oncology | Admitting: Oncology

## 2016-11-10 DIAGNOSIS — C8331 Diffuse large B-cell lymphoma, lymph nodes of head, face, and neck: Secondary | ICD-10-CM

## 2016-11-10 DIAGNOSIS — Z9221 Personal history of antineoplastic chemotherapy: Secondary | ICD-10-CM | POA: Diagnosis not present

## 2016-11-10 DIAGNOSIS — R933 Abnormal findings on diagnostic imaging of other parts of digestive tract: Secondary | ICD-10-CM

## 2016-11-10 NOTE — Telephone Encounter (Signed)
Advised pt of procedure scheduled for 2/22 @ 720am (included prep information).

## 2016-11-10 NOTE — Progress Notes (Signed)
*  PRELIMINARY RESULTS* Echocardiogram 2D Echocardiogram has been performed.  Christina [name] 11/10/2016, 12:00 PM

## 2016-11-11 ENCOUNTER — Ambulatory Visit: Payer: Managed Care, Other (non HMO) | Admitting: Certified Registered"

## 2016-11-11 ENCOUNTER — Inpatient Hospital Stay (HOSPITAL_BASED_OUTPATIENT_CLINIC_OR_DEPARTMENT_OTHER): Payer: Managed Care, Other (non HMO) | Admitting: Oncology

## 2016-11-11 ENCOUNTER — Ambulatory Visit
Admission: RE | Admit: 2016-11-11 | Discharge: 2016-11-11 | Disposition: A | Discharge status: Home / Self Care | Payer: Managed Care, Other (non HMO) | Source: Ambulatory Visit | Attending: Gastroenterology | Admitting: Gastroenterology

## 2016-11-11 ENCOUNTER — Inpatient Hospital Stay: Payer: Managed Care, Other (non HMO)

## 2016-11-11 ENCOUNTER — Encounter
Admission: RE | Disposition: A | Discharge status: Home / Self Care | Payer: Self-pay | Source: Ambulatory Visit | Attending: Gastroenterology

## 2016-11-11 ENCOUNTER — Telehealth: Payer: Self-pay

## 2016-11-11 VITALS — BP 104/71 | HR 84 | Temp 98.0°F | Resp 20

## 2016-11-11 VITALS — BP 107/70 | HR 72 | Temp 97.4°F | Wt 160.1 lb

## 2016-11-11 DIAGNOSIS — R933 Abnormal findings on diagnostic imaging of other parts of digestive tract: Secondary | ICD-10-CM

## 2016-11-11 DIAGNOSIS — R05 Cough: Secondary | ICD-10-CM

## 2016-11-11 DIAGNOSIS — K2960 Other gastritis without bleeding: Secondary | ICD-10-CM | POA: Diagnosis not present

## 2016-11-11 DIAGNOSIS — R590 Localized enlarged lymph nodes: Secondary | ICD-10-CM | POA: Diagnosis not present

## 2016-11-11 DIAGNOSIS — Z8249 Family history of ischemic heart disease and other diseases of the circulatory system: Secondary | ICD-10-CM | POA: Diagnosis not present

## 2016-11-11 DIAGNOSIS — K259 Gastric ulcer, unspecified as acute or chronic, without hemorrhage or perforation: Secondary | ICD-10-CM | POA: Diagnosis not present

## 2016-11-11 DIAGNOSIS — C8331 Diffuse large B-cell lymphoma, lymph nodes of head, face, and neck: Secondary | ICD-10-CM | POA: Diagnosis not present

## 2016-11-11 DIAGNOSIS — R51 Headache: Secondary | ICD-10-CM | POA: Diagnosis not present

## 2016-11-11 DIAGNOSIS — Z9889 Other specified postprocedural states: Secondary | ICD-10-CM | POA: Insufficient documentation

## 2016-11-11 DIAGNOSIS — Z90710 Acquired absence of both cervix and uterus: Secondary | ICD-10-CM | POA: Diagnosis not present

## 2016-11-11 DIAGNOSIS — Z803 Family history of malignant neoplasm of breast: Secondary | ICD-10-CM

## 2016-11-11 DIAGNOSIS — Z801 Family history of malignant neoplasm of trachea, bronchus and lung: Secondary | ICD-10-CM | POA: Diagnosis not present

## 2016-11-11 DIAGNOSIS — B9681 Helicobacter pylori [H. pylori] as the cause of diseases classified elsewhere: Secondary | ICD-10-CM | POA: Insufficient documentation

## 2016-11-11 DIAGNOSIS — K3189 Other diseases of stomach and duodenum: Secondary | ICD-10-CM | POA: Insufficient documentation

## 2016-11-11 DIAGNOSIS — Z79818 Long term (current) use of other agents affecting estrogen receptors and estrogen levels: Secondary | ICD-10-CM | POA: Diagnosis not present

## 2016-11-11 DIAGNOSIS — Z5112 Encounter for antineoplastic immunotherapy: Secondary | ICD-10-CM | POA: Diagnosis not present

## 2016-11-11 DIAGNOSIS — C8510 Unspecified B-cell lymphoma, unspecified site: Secondary | ICD-10-CM | POA: Diagnosis not present

## 2016-11-11 DIAGNOSIS — Z7951 Long term (current) use of inhaled steroids: Secondary | ICD-10-CM | POA: Insufficient documentation

## 2016-11-11 DIAGNOSIS — F419 Anxiety disorder, unspecified: Secondary | ICD-10-CM

## 2016-11-11 DIAGNOSIS — R109 Unspecified abdominal pain: Secondary | ICD-10-CM | POA: Diagnosis not present

## 2016-11-11 DIAGNOSIS — Z7189 Other specified counseling: Secondary | ICD-10-CM | POA: Insufficient documentation

## 2016-11-11 HISTORY — PX: ESOPHAGOGASTRODUODENOSCOPY (EGD) WITH PROPOFOL: SHX5813

## 2016-11-11 LAB — CBC WITH DIFFERENTIAL/PLATELET
BASOS PCT: 1 %
Basophils Absolute: 0 10*3/uL (ref 0–0.1)
EOS ABS: 0 10*3/uL (ref 0–0.7)
Eosinophils Relative: 1 %
HEMATOCRIT: 34.6 % — AB (ref 35.0–47.0)
Hemoglobin: 11.8 g/dL — ABNORMAL LOW (ref 12.0–16.0)
Lymphocytes Relative: 28 %
Lymphs Abs: 1.5 10*3/uL (ref 1.0–3.6)
MCH: 29.1 pg (ref 26.0–34.0)
MCHC: 34.1 g/dL (ref 32.0–36.0)
MCV: 85.4 fL (ref 80.0–100.0)
MONO ABS: 0.6 10*3/uL (ref 0.2–0.9)
MONOS PCT: 11 %
NEUTROS ABS: 3.3 10*3/uL (ref 1.4–6.5)
Neutrophils Relative %: 61 %
Platelets: 307 10*3/uL (ref 150–440)
RBC: 4.05 MIL/uL (ref 3.80–5.20)
RDW: 12.9 % (ref 11.5–14.5)
WBC: 5.5 10*3/uL (ref 3.6–11.0)

## 2016-11-11 LAB — COMPREHENSIVE METABOLIC PANEL
ALBUMIN: 3.8 g/dL (ref 3.5–5.0)
ALT: 21 U/L (ref 14–54)
ANION GAP: 4 — AB (ref 5–15)
AST: 25 U/L (ref 15–41)
Alkaline Phosphatase: 80 U/L (ref 38–126)
BILIRUBIN TOTAL: 0.6 mg/dL (ref 0.3–1.2)
BUN: 14 mg/dL (ref 6–20)
CALCIUM: 9 mg/dL (ref 8.9–10.3)
CO2: 26 mmol/L (ref 22–32)
Chloride: 107 mmol/L (ref 101–111)
Creatinine, Ser: 0.68 mg/dL (ref 0.44–1.00)
GFR calc non Af Amer: >60 mL/min (ref >60)
GLUCOSE: 113 mg/dL — AB (ref 65–99)
POTASSIUM: 4.2 mmol/L (ref 3.5–5.1)
Sodium: 137 mmol/L (ref 135–145)
TOTAL PROTEIN: 7.1 g/dL (ref 6.5–8.1)

## 2016-11-11 LAB — BONE MARROW EXAM

## 2016-11-11 SURGERY — ESOPHAGOGASTRODUODENOSCOPY (EGD) WITH PROPOFOL
Anesthesia: General

## 2016-11-11 MED ORDER — PROPOFOL 10 MG/ML IV BOLUS
INTRAVENOUS | Status: DC | PRN
  Administered 2016-11-11: 70 mg via INTRAVENOUS

## 2016-11-11 MED ORDER — PREDNISONE 50 MG PO TABS: 100.0000 mg | ORAL_TABLET | Freq: Every day | ORAL | 3 refills | Status: DC

## 2016-11-11 MED ORDER — PROPOFOL 500 MG/50ML IV EMUL
INTRAVENOUS | Status: DC | PRN
  Administered 2016-11-11: 150 ug/kg/min via INTRAVENOUS

## 2016-11-11 MED ORDER — PROPOFOL 10 MG/ML IV BOLUS
INTRAVENOUS | Status: AC
  Filled 2016-11-11: qty 20

## 2016-11-11 MED ORDER — GLYCOPYRROLATE 0.2 MG/ML IJ SOLN
INTRAMUSCULAR | Status: AC
  Filled 2016-11-11: qty 1

## 2016-11-11 MED ORDER — FENTANYL CITRATE (PF) 100 MCG/2ML IJ SOLN
INTRAMUSCULAR | Status: AC
  Filled 2016-11-11: qty 2

## 2016-11-11 MED ORDER — METHYLPREDNISOLONE SODIUM SUCC 125 MG IJ SOLR
100.0000 mg | Freq: Once | INTRAMUSCULAR | Status: AC
  Administered 2016-11-11: 100 mg via INTRAVENOUS

## 2016-11-11 MED ORDER — HEPARIN SOD (PORK) LOCK FLUSH 100 UNIT/ML IV SOLN
500.0000 [IU] | Freq: Once | INTRAVENOUS | Status: AC | PRN
  Administered 2016-11-11: 500 [IU]
  Filled 2016-11-11: qty 5

## 2016-11-11 MED ORDER — DIPHENHYDRAMINE HCL 25 MG PO CAPS
50.0000 mg | ORAL_CAPSULE | Freq: Once | ORAL | Status: AC
  Administered 2016-11-11: 50 mg via ORAL
  Filled 2016-11-11: qty 2

## 2016-11-11 MED ORDER — SODIUM CHLORIDE 0.9 % IV SOLN
Freq: Once | INTRAVENOUS | Status: AC
  Administered 2016-11-11: 11:00:00 via INTRAVENOUS
  Filled 2016-11-11: qty 1000

## 2016-11-11 MED ORDER — PHENYLEPHRINE HCL 10 MG/ML IJ SOLN
INTRAMUSCULAR | Status: AC
Start: 2016-11-11 — End: 2016-11-11
  Filled 2016-11-11: qty 1

## 2016-11-11 MED ORDER — SODIUM CHLORIDE 0.9 % IV SOLN
INTRAVENOUS | Status: DC
  Administered 2016-11-11: 08:00:00 via INTRAVENOUS

## 2016-11-11 MED ORDER — SODIUM CHLORIDE 0.9 % IV SOLN
375.0000 mg/m2 | Freq: Once | INTRAVENOUS | Status: AC
  Administered 2016-11-11: 700 mg via INTRAVENOUS
  Filled 2016-11-11: qty 50

## 2016-11-11 MED ORDER — DIPHENHYDRAMINE HCL 50 MG/ML IJ SOLN
25.0000 mg | Freq: Once | INTRAMUSCULAR | Status: AC | PRN
  Administered 2016-11-11: 25 mg via INTRAVENOUS

## 2016-11-11 MED ORDER — PROCHLORPERAZINE MALEATE 10 MG PO TABS: 10.0000 mg | ORAL_TABLET | Freq: Four times a day (QID) | ORAL | 3 refills | Status: DC | PRN

## 2016-11-11 MED ORDER — ALLOPURINOL 300 MG PO TABS: 300.0000 mg | ORAL_TABLET | Freq: Every day | ORAL | 1 refills | Status: DC

## 2016-11-11 MED ORDER — LORAZEPAM 0.5 MG PO TABS: 0.5000 mg | ORAL_TABLET | Freq: Four times a day (QID) | ORAL | 0 refills | Status: DC | PRN

## 2016-11-11 MED ORDER — GLYCOPYRROLATE 0.2 MG/ML IJ SOLN
INTRAMUSCULAR | Status: DC | PRN
  Administered 2016-11-11: 0.2 mg via INTRAVENOUS

## 2016-11-11 MED ORDER — LIDOCAINE HCL (PF) 2 % IJ SOLN
INTRAMUSCULAR | Status: AC
  Filled 2016-11-11: qty 2

## 2016-11-11 MED ORDER — ONDANSETRON HCL 8 MG PO TABS: 8.0000 mg | ORAL_TABLET | Freq: Two times a day (BID) | ORAL | 1 refills | Status: DC | PRN

## 2016-11-11 MED ORDER — LIDOCAINE HCL (CARDIAC) 20 MG/ML IV SOLN
INTRAVENOUS | Status: DC | PRN
  Administered 2016-11-11: 100 mg via INTRAVENOUS

## 2016-11-11 MED ORDER — ACETAMINOPHEN 325 MG PO TABS
650.0000 mg | ORAL_TABLET | Freq: Once | ORAL | Status: AC
  Administered 2016-11-11: 650 mg via ORAL
  Filled 2016-11-11: qty 2

## 2016-11-11 MED ORDER — MIDAZOLAM HCL 2 MG/2ML IJ SOLN
INTRAMUSCULAR | Status: AC
  Filled 2016-11-11: qty 2

## 2016-11-11 NOTE — Anesthesia Preprocedure Evaluation (Signed)
Anesthesia Evaluation  Patient identified by MRN, date of birth, ID band Patient awake    Reviewed: Allergy & Precautions, NPO status , Patient's Chart, lab work & pertinent test results  History of Anesthesia Complications Negative for: history of anesthetic complications  Airway Mallampati: II  TM Distance: >3 FB Neck ROM: Full    Dental no notable dental hx.    Pulmonary neg pulmonary ROS, neg sleep apnea, neg COPD,    breath sounds clear to auscultation- rhonchi (-) wheezing      Cardiovascular Exercise Tolerance: Good (-) hypertension(-) CAD and (-) Past MI  Rhythm:Regular Rate:Normal - Systolic murmurs and - Diastolic murmurs    Neuro/Psych negative neurological ROS  negative psych ROS   GI/Hepatic negative GI ROS, Neg liver ROS,   Endo/Other  negative endocrine ROSneg diabetes  Renal/GU negative Renal ROS     Musculoskeletal negative musculoskeletal ROS (+)   Abdominal (+) - obese,   Peds  Hematology negative hematology ROS (+)   Anesthesia Other Findings Past Medical History: No date: Cancer ([place])     Comment: lymphoma, non hodgekins B cell   Reproductive/Obstetrics                             Anesthesia Physical Anesthesia Plan  ASA: II  Anesthesia Plan: General   Post-op Pain Management:    Induction: Intravenous  Airway Management Planned: Natural Airway  Additional Equipment:   Intra-op Plan:   Post-operative Plan:   Informed Consent: I have reviewed the patients History and Physical, chart, labs and discussed the procedure including the risks, benefits and alternatives for the proposed anesthesia with the patient or authorized representative who has indicated his/her understanding and acceptance.   Dental advisory given  Plan Discussed with: CRNA and Anesthesiologist  Anesthesia Plan Comments:         Anesthesia Quick Evaluation

## 2016-11-11 NOTE — Progress Notes (Signed)
Patient brought to exam room 5 via wheelchair, accompanied by her husband.  Patient states she had a headache earlier but it's easing off.  Vitals stable and documented

## 2016-11-11 NOTE — Anesthesia Postprocedure Evaluation (Signed)
Anesthesia Post Note  Patient: Christina [name]  Procedure(s) Performed: Procedure(s) (LRB): ESOPHAGOGASTRODUODENOSCOPY (EGD) WITH PROPOFOL (N/A)  Patient location during evaluation: Endoscopy Anesthesia Type: General Level of consciousness: awake and alert and oriented Pain management: pain level controlled Vital Signs Assessment: post-procedure vital signs reviewed and stable Respiratory status: spontaneous breathing, nonlabored ventilation and respiratory function stable Cardiovascular status: blood pressure returned to baseline and stable Postop Assessment: no signs of nausea or vomiting Anesthetic complications: no     Last Vitals:  Vitals:   11/11/16 0850 11/11/16 0900  BP: 101/71 110/68  Pulse: 67 65  Resp: 16 16  Temp:      Last Pain:  Vitals:   11/11/16 0804  TempSrc: Tympanic                 [name]

## 2016-11-11 NOTE — Progress Notes (Signed)
13:00 - patient complains of throat itching, stopped Rituxan, increased fluids, checked Vitals (see flowsheet), called for assistance and called Dr. Janese Banks.  Patient complained of "not feeling right" and said she needed help, complained she could not breathe and was going to pass out.  Gave 25 mg Benadryl IV and 100 mg of Solumedrol IV (per MD).  Patient's vitals remained stable and patient turned around very quickly.  Per Dr. Janese Banks, watch for about 30 minutes and may restart Rituxan at a lower rate if patient is stable and returns to baseline.  LJ

## 2016-11-11 NOTE — Op Note (Signed)
[facility] Patient Name: Christina [name] Procedure Date: 11/11/2016 8:11 AM MRN: [id number] Account #: [id number] Date of Birth: Oct 03, 1960 Admit Type: Ambulatory Age: 56 Room: [facility] ENDO ROOM 4 Gender: Female Note Status: Finalized Procedure:            Upper GI endoscopy Indications:          Abnormal CT of the GI tract Providers:            Jonathon Bellows MD, MD Medicines:            Monitored Anesthesia Care Complications:        No immediate complications. Procedure:            Pre-Anesthesia Assessment:                       - Prior to the procedure, a History and Physical was                        performed, and patient medications, allergies and                        sensitivities were reviewed. The patient's tolerance of                        previous anesthesia was reviewed.                       - The risks and benefits of the procedure and the                        sedation options and risks were discussed with the                        patient. All questions were answered and informed                        consent was obtained.                       - The risks and benefits of the procedure and the                        sedation options and risks were discussed with the                        patient. All questions were answered and informed                        consent was obtained.                       - After reviewing the risks and benefits, the patient                        was deemed in satisfactory condition to undergo the                        procedure.                       - ASA Grade Assessment: II - A patient with mild  systemic disease.                       After obtaining informed consent, the endoscope was                        passed under direct vision. Throughout the procedure,                        the patient's blood pressure, pulse, and oxygen                        saturations  were monitored continuously. The Endoscope                        was introduced through the mouth, and advanced to the                        third part of duodenum. The upper GI endoscopy was                        accomplished with ease. The patient tolerated the                        procedure well. Findings:      The examined duodenum was normal.      The esophagus was normal.      Diffuse moderately erythematous mucosa without bleeding was found in the       entire examined stomach.      One non-bleeding superficial gastric ulcer with no stigmata of bleeding       was found on the greater curvature of the stomach. The lesion was 6 mm       in largest dimension. Biopsies were taken with a cold forceps for       histology.      A medium-sized, submucosal mass was seen on retroflexion not at the       cardia but closer to the junction with the fundus . Wasnt clear if it       was a submucosal mass vs normal anatomy ,with no bleeding and no       stigmata of recent bleeding. I did not take any biopsies since it would       cause edema and make visualization of the area with an EUS harder which       I will try to obtain today . Impression:           - Normal examined duodenum.                       - Normal esophagus.                       - Erythematous mucosa in the stomach.                       - Non-bleeding gastric ulcer with no stigmata of                        bleeding. Biopsied.                       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  Recommendation:       - Await pathology results.                       - Perform an upper endoscopic ultrasound (UEUS) at the                        next available appointment.                       - Await pathology results.                       - Use Prilosec (omeprazole) 40 mg PO daily for 4 weeks.                       - I will speak to Dr Janese Banks her oncologist about need for                        an EUS asap to evaluate the  submucosal bulge seen in                        the stomach . If nothing is seen on EUS would suggest                        multiple deep biopsies to be taken in the area of the                        cardia and fundus . I did not take biopsies in this                        area today since the edema would make visualization                        harder during EUS if done today. Procedure Code(s):    --- Professional ---                       [phone], Esophagogastroduodenoscopy, flexible, transoral;                        with biopsy, single or multiple Diagnosis Code(s):    --- Professional ---                       K31.89, Other diseases of stomach and duodenum                       K25.9, Gastric ulcer, unspecified as acute or chronic,                        without hemorrhage or perforation                       D49.0, Neoplasm of unspecified behavior of digestive                        system                       R93.3, Abnormal findings on diagnostic imaging of other  parts of digestive tract CPT copyright 2016 [employer]. All rights reserved. The codes documented in this report are preliminary and upon coder review may  be revised to meet current compliance requirements. Jonathon Bellows, MD Jonathon Bellows MD, MD 11/11/2016 8:27:47 AM This report has been signed electronically. Number of Addenda: 0 Note Initiated On: 11/11/2016 8:11 AM      [facility]

## 2016-11-11 NOTE — Anesthesia Procedure Notes (Signed)
Performed by: Lorinda Copland Pre-anesthesia Checklist: Patient identified, Emergency Drugs available, Suction available, Patient being monitored and Timeout performed Patient Re-evaluated:Patient Re-evaluated prior to inductionOxygen Delivery Method: Nasal cannula Preoxygenation: Pre-oxygenation with 100% oxygen Intubation Type: IV induction       

## 2016-11-11 NOTE — Progress Notes (Signed)
Hematology/Oncology Consult note [facility]  [phone] [phone]  Patient Care Team: [name], NP as PCP - General (Nurse Practitioner)   Name of the patient: Christina [name]  [id number]  06/29/1961   Date of visit: 11/11/16  Diagnosis- DLBCL germinal center type. Gastric evaluation pending staging  Chief complaint/ Reason for visit- assessment prior to starting chemotherapt today   Heme/Onc history: patient is a 56 year old female who presented to her primary care doctor with symptoms of right neck swelling in January 2018. Her symptoms of an ongoing since December 2017. Patient works at a [employer] (medial in the swelling but the swelling did not go away. She was then referred to ENT.   2. CT of the soft tissue neck with contrast showed malignant right leg lymphadenopathy with heterogeneous enhancement and extracapsular extension occupying both the right level II and level III nodal stations. Individually B abnormal lymph nodes measure up to 4.2 cm in largest dimension and the conglomerulation of abnormal lymph nodes is 5-5.5 cm across. There is a small but asymmetric and conspicuity 7 mm lymph node along the lower right 3-B station. Mass effect on the right carotid space from the abnormal nodes but the major vascular structures in the neck at the skull base including the right IJ remained patent.  3. 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 Cells were CD20 positive and BCL 6 and partially dim BCL-2 positive. B cells were negative for C myc (10%). Mom one was positive in 50% of the cells. Ki-67 was 50%.Bone marrow biopsy was negative for lymphoma involvement  4. Patient is otherwise doing well and denies any symptoms of fevers, chills, unintentional weight loss or drenching night sweats. She does not have any significant comorbidities. Currently reports dry  non productive cough for last 1 week  5. PET/CT showed IMPRESSION: 1. FDG avid malignancy is identified in the level 2 and 3 nodal stations of the right cervical lymph node chain, consistent with the patient's known lymphoma. 2. Focal uptake in the gastric cardia is suspicious for malignancy as well. Recommend direct visualization and biopsy as clinically Warranted.  6. Patient was seen by Dr. Vicente Males for EGD which she underwent today. EGD showed diffuse erythematous mucosa involving the stomach.here was a 6 mm non bleeding ulcer which was biopsied.there was also a submucosal bulging noted was the fundus of the stomach for which you S was recommended    Interval history- patient feels anxious prior to starting treatment. Also reports constant headaches for which she has been taking Tylenol. Reports of abdominal pain. Denies other complaints  ECOG PS- 0 Pain scale- 4- headaches Opioid associated constipation- no  Review of systems- Review of Systems  Constitutional: Negative for chills, fever, malaise/fatigue and weight loss.  HENT: Negative for congestion, ear discharge and nosebleeds.   Eyes: Negative for blurred vision.  Respiratory: Negative for cough, hemoptysis, sputum production, shortness of breath and wheezing.   Cardiovascular: Negative for chest pain, palpitations, orthopnea and claudication.  Gastrointestinal: Negative for abdominal pain, blood in stool, constipation, diarrhea, heartburn, melena, nausea and vomiting.  Genitourinary: Negative for dysuria, flank pain, frequency, hematuria and urgency.  Musculoskeletal: Negative for back pain, joint pain and myalgias.  Skin: Negative for rash.  Neurological: Negative for dizziness, tingling, focal weakness, seizures, weakness and headaches.  Endo/Heme/Allergies: Does not bruise/bleed easily.  Psychiatric/Behavioral: Negative for depression and suicidal ideas. The patient does not have insomnia.  Current treatment- RCHOP  to start today  No Known Allergies   Past Medical History:  Diagnosis Date  . Cancer ([place])    lymphoma, non hodgekins B cell     Past Surgical History:  Procedure Laterality Date  . ABDOMINAL HYSTERECTOMY    . CESAREAN SECTION     x 2   . IR GENERIC HISTORICAL  11/03/2016   IR FLUORO GUIDE PORT INSERTION RIGHT 11/03/2016 Aletta Edouard, MD [facility]-INTERV RAD  . REFRACTIVE SURGERY  09/2015    Social History   Social History  . Marital status: Single    Spouse name: N/A  . Number of children: N/A  . Years of education: N/A   Occupational History  . Not on file.   Social History Main Topics  . Smoking status: Never Smoker  . Smokeless tobacco: Never Used  . Alcohol use No  . Drug use: No  . Sexual activity: Yes   Other Topics Concern  . Not on file   Social History Narrative  . No narrative on file    Family History  Problem Relation Age of Onset  . Cancer Mother     breast  . Cancer Father     lung  . Heart disease Brother 34    MI  . Heart disease Paternal Grandfather     MI    No current facility-administered medications for this visit.  No current outpatient prescriptions on file.  Facility-Administered Medications Ordered in Other Visits:  .  0.9 %  sodium chloride infusion, , Intravenous, Continuous, Jonathon Bellows, MD, Last Rate: 20 mL/hr at 11/11/16 0807  Physical exam:  Vitals:   11/11/16 1020  BP: 107/70  Pulse: 72  Temp: 97.4 F (36.3 C)  TempSrc: Tympanic  Weight: 160 lb 0.9 oz (72.6 kg)   Physical Exam  Constitutional: She is oriented to person, place, and time and well-developed, well-nourished, and in no distress.  HENT:  Head: Normocephalic and atraumatic.  Eyes: EOM are normal. Pupils are equal, round, and reactive to light.  Neck: Normal range of motion.  Cardiovascular: Normal rate, regular rhythm and normal heart sounds.   Pulmonary/Chest: Effort normal and breath sounds normal.  Abdominal: Soft. Bowel sounds are normal.    Neurological: She is alert and oriented to person, place, and time.  Skin: Skin is warm and dry.     CMP Latest Ref Rng & Units 11/03/2016  Glucose 65 - 99 mg/dL 104(H)  BUN 6 - 20 mg/dL 11  Creatinine 0.44 - 1.00 mg/dL 0.74  Sodium 135 - 145 mmol/L 140  Potassium 3.5 - 5.1 mmol/L 4.0  Chloride 101 - 111 mmol/L 108  CO2 22 - 32 mmol/L 26  Calcium 8.9 - 10.3 mg/dL 8.8(L)  Total Protein 6.5 - 8.1 g/dL -  Total Bilirubin 0.3 - 1.2 mg/dL -  Alkaline Phos 38 - 126 U/L -  AST 15 - 41 U/L -  ALT 14 - 54 U/L -   CBC Latest Ref Rng & Units 11/03/2016  WBC 3.6 - 11.0 K/uL 4.4  Hemoglobin 12.0 - 16.0 g/dL 12.1  Hematocrit 35.0 - 47.0 % 35.3  Platelets 150 - 440 K/uL 283    No images are attached to the encounter.  Nm Pet Image Initial (pi) Skull Base To Thigh  Result Date: 11/08/2016 CLINICAL DATA:  Diffuse large B-cell lymphoma in the neck. Initial staging. Recent port placement. Biopsy of a right neck lymph node October 18, 2016. Bone marrow biopsy in the right  pelvis on November 03, 2016. EXAM: NUCLEAR MEDICINE PET SKULL BASE TO THIGH TECHNIQUE: 11.69 mCi F-18 FDG was injected intravenously. Full-ring PET imaging was performed from the skull base to thigh after the radiotracer. CT data was obtained and used for attenuation correction and anatomic localization. FASTING BLOOD GLUCOSE:  Value: 94 mg/dl COMPARISON:  CT of the neck October 01, 2016 FINDINGS: NECK Again noted are abnormal lymph nodes in the level 2 and 3 right cervical nodal stations, involving a and b sub sets at these levels. The lymph nodes were better measure on the recent contrast-enhanced CT scan. The index node on the right on series 3, image 25 correlates with the 2.2 cm node on the recent CT scan and demonstrates a maximum SUV of 18.6. The 7 mm node along the lowest right level IIIb station on the recent CT scan is not FDG avid. No FDG avid disease is seen at levels 1, 4, 5, 6, or 7. CHEST No hypermetabolic mediastinal or  hilar nodes. No suspicious pulmonary nodules on the CT scan. ABDOMEN/PELVIS There is focal uptake centered in the gastric cardia with a maximum SUV of 8.5. No abnormal uptake in the spleen. No FDG-avid adenopathy seen within the abdomen or pelvis. No other FDG avid disease in the abdomen or pelvis. SKELETON No focal hypermetabolic activity to suggest skeletal metastasis. IMPRESSION: 1. FDG avid malignancy is identified in the level 2 and 3 nodal stations of the right cervical lymph node chain, consistent with the patient's known lymphoma. 2. Focal uptake in the gastric cardia is suspicious for malignancy as well. Recommend direct visualization and biopsy as clinically warranted. Electronically Signed   By: Dorise Bullion III M.D   On: 11/08/2016 13:47   US Biopsy  Result Date: 10/18/2016 INDICATION: Multiple right cervical lymphadenopathy EXAM: ULTRASOUND BIOPSY CERVICAL LYMPHADENOPATHY MEDICATIONS: None. ANESTHESIA/SEDATION: None FLUOROSCOPY TIME:  Not applicable COMPLICATIONS: None immediate. PROCEDURE: Informed written consent was obtained from the patient after a thorough discussion of the procedural risks, benefits and alternatives. All questions were addressed. Maximal Sterile Barrier Technique was utilized including caps, mask, sterile gowns, sterile gloves, sterile drape, hand hygiene and skin antiseptic. A timeout was performed prior to the initiation of the procedure. Utilizing 0.25% Marcaine as a local and deep tissue anesthetic and real-time ultrasound guidance a 15 gauge guiding needle was placed percutaneously into the right neck adjacent to 1 of the abnormally enlarged lymph nodes beneath the sternocleidomastoid muscle. Dedicated ultrasound imaging was obtained during needle placement. Imaging was also obtained during core biopsy. Multiple 16 gauge core biopsies were then obtained. The initial biopsy was sent for touch prep evaluation and felt to be adequate as determined by the pathologist.  Three more sizable cores were obtained and sent for pathologic evaluation. The guiding needle was then removed and hemostasis obtained at the puncture site. Sterile dressing was applied. The patient tolerated the procedure well and was returned her room in satisfactory condition. IMPRESSION: Successful ultrasound-guided biopsy of right cervical lymphadenopathy. The initial touch prep sample was deemed adequate by the pathologist. Electronically Signed   By: Inez Catalina M.D.   On: 10/18/2016 09:31   Ct Bone Marrow Biopsy & Aspiration  Result Date: 11/03/2016 CLINICAL DATA:  Diffuse large B-cell lymphoma of neck. The patient requires bone marrow biopsy. EXAM: CT GUIDED BONE MARROW ASPIRATION AND BIOPSY ANESTHESIA/SEDATION: Versed 2.0 mg IV, Fentanyl 100 mcg IV Total Moderate Sedation Time:  16 minutes. The patient's level of consciousness and physiologic status were continuously monitored during  the procedure by Radiology nursing. PROCEDURE: The procedure risks, benefits, and alternatives were explained to the patient. Questions regarding the procedure were encouraged and answered. The patient understands and consents to the procedure. A time out was performed prior to initiating the procedure. The right gluteal region was prepped with chlorhexidine. Sterile gown and sterile gloves were used for the procedure. Local anesthesia was provided with 1% Lidocaine. Under CT guidance, an 11 gauge On Control bone cutting needle was advanced from a posterior approach into the right iliac bone. Needle positioning was confirmed with CT. Initial non heparinized and heparinized aspirate samples were obtained of bone marrow. Core biopsy was performed via the On Control drill needle. COMPLICATIONS: None FINDINGS: Inspection of initial aspirate did reveal visible particles. Intact core biopsy sample was obtained. IMPRESSION: CT guided bone marrow biopsy of right posterior iliac bone with both aspirate and core samples obtained.  Electronically Signed   By: Aletta Edouard M.D.   On: 11/03/2016 15:41   Ir Fluoro Guide Port Insertion Right  Result Date: 11/03/2016 CLINICAL DATA:  Diffuse large B-cell lymphoma. The patient requires a porta cath to begin chemotherapy. EXAM: IMPLANTED PORT A CATH PLACEMENT WITH ULTRASOUND AND FLUOROSCOPIC GUIDANCE ANESTHESIA/SEDATION: 1.0 mg IV Versed; 50 mcg IV Fentanyl Total Moderate Sedation Time:  52 minutes The patient's level of consciousness and physiologic status were continuously monitored during the procedure by Radiology nursing. Additional Medications: 2 g IV Ancef. As antibiotic prophylaxis, Ancef was ordered pre-procedure and administered intravenously within one hour of incision. FLUOROSCOPY TIME:  36 seconds.  5.0 mGy. PROCEDURE: The procedure, risks, benefits, and alternatives were explained to the patient. Questions regarding the procedure were encouraged and answered. The patient understands and consents to the procedure. A time-out was performed prior to initiating the procedure. Ultrasound was utilized to confirm patency of the right internal jugular vein. The right neck and chest were prepped with chlorhexidine in a sterile fashion, and a sterile drape was applied covering the operative field. Maximum barrier sterile technique with sterile gowns and gloves were used for the procedure. Local anesthesia was provided with 1% lidocaine. After creating a small venotomy incision, a 21 gauge needle was advanced into the right internal jugular vein under direct, real-time ultrasound guidance. Ultrasound image documentation was performed. After securing guidewire access, an 8 Fr dilator was placed. A J-wire was kinked to measure appropriate catheter length. A subcutaneous port pocket was then created along the upper chest wall utilizing sharp and blunt dissection. Portable cautery was utilized. The pocket was irrigated with sterile saline. A single lumen power injectable port was chosen for  placement. The 8 Fr catheter was tunneled from the port pocket site to the venotomy incision. The port was placed in the pocket. External catheter was trimmed to appropriate length based on guidewire measurement. At the venotomy, an 8 Fr peel-away sheath was placed over a guidewire. The catheter was then placed through the sheath and the sheath removed. Final catheter positioning was confirmed and documented with a fluoroscopic spot image. The port was accessed with a needle and aspirated and flushed with heparinized saline. The access needle was removed. The venotomy and port pocket incisions were closed with subcutaneous 4-0 Monocryl and subcuticular 4-0 Monocryl. Dermabond was applied to both incisions. COMPLICATIONS: COMPLICATIONS None FINDINGS: After catheter placement, the tip lies at the cavo-atrial junction. The catheter aspirates normally and is ready for immediate use. IMPRESSION: Placement of single lumen port a cath via right internal jugular vein. The catheter tip lies  at the cavo-atrial junction. A power injectable port a cath was placed and is ready for immediate use. Electronically Signed   By: Aletta Edouard M.D.   On: 11/03/2016 15:42     Assessment and plan- Patient is a 56 y.o. 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 I discussed the results of the PET CT scan with the patient in detail. Patient does have evidence of right cervical adenopathy but it is non-bulky given that it not more than 10 cm. Also patient does not have any B symptoms and has a low risk IPI score. PET CT however didn't pick up focal uptake in the gastric cardia with an SUV of 8. Patient did have an EGD this morning findings of which are mentioned above. At this time it is unclear if there is gastric involvement of her lymphoma. If this is involved this would make him stage IV and I would be inclined to give her 6  cycles of R CHOP. If gastric involvement is negative, I would favor giving her 3 cycles of R CHOP followed by involved field radiation. We will await the results of ulcer biopsy from today. She will be seen by Dr.Spate from [facility] and undergo EUS early next week. Patient is currently having headaches and her lymph nodes are pressing on the vascular structures. I do not think that it would be in the best interest of the patient to wait for another week to get EUS and wait for the results before starting treatment. I will therefore start chemotherapy this week. Depending on the findings at the time of EUS- we will decide about further doses of chemotherapy. We are unable to accommodate the entire chemotherapy today because of delay in her appointment. We will therefore proceed with rituximab only today and she will get CHOP chemotherapy tomorrow. Baseline echocardiogram was normal. Hepatitis testing was negative. I will decide to start her on a PPI after EUS is completed.we are proceeding with curative intent of chemotherapy at this time  2. Headaches- patient to continue nighttime Tylenol for now   Total face to face encounter time for this patient visit was 45 min. >50% of the time was  spent in counseling and coordination of care. Patient had multiple insightful questions and all of them were answered to her satisfaction.   Visit Diagnosis 1. Goals of care, counseling/discussion   2. 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HCC)      Dr. Randa Evens, MD, MPH [facility] Pager- [phone] 11/11/2016 9:13 AM

## 2016-11-11 NOTE — H&P (Signed)
  Jonathon Bellows MD [address] [place], [place] [address] Phone: [phone] Fax : [phone]  Primary Care Physician:  [name], NP Primary Gastroenterologist:  Dr. Jonathon Bellows   Pre-Procedure History & Physical: HPI:  Christina [name] is a 56 y.o. female is here for an endoscopy.   Past Medical History:  Diagnosis Date  . Cancer ([place])    lymphoma, non hodgekins B cell    Past Surgical History:  Procedure Laterality Date  . ABDOMINAL HYSTERECTOMY    . CESAREAN SECTION     x 2   . IR GENERIC HISTORICAL  11/03/2016   IR FLUORO GUIDE PORT INSERTION RIGHT 11/03/2016 Aletta Edouard, MD [facility]-INTERV RAD  . REFRACTIVE SURGERY  09/2015    Prior to Admission medications   Medication Sig Start Date End Date Taking? Authorizing Provider  estradiol (CLIMARA - DOSED IN MG/24 HR) 0.05 mg/24hr patch once a week. 08/30/16  Yes Historical Provider, MD  lidocaine-prilocaine (EMLA) cream Apply cream 1 hour before chemotherapy treatment, place a small piece of saran wrap over cream to protect clothing 11/07/16  Yes Sindy Guadeloupe, MD  [facility] ALLERGY RELIEF 50 MCG/ACT nasal spray  10/26/16   Historical Provider, MD    Allergies as of 11/10/2016  . (No Known Allergies)    Family History  Problem Relation Age of Onset  . Cancer Mother     breast  . Cancer Father     lung  . Heart disease Brother 55    MI  . Heart disease Paternal Grandfather     MI    Social History   Social History  . Marital status: Single    Spouse name: N/A  . Number of children: N/A  . Years of education: N/A   Occupational History  . Not on file.   Social History Main Topics  . Smoking status: Never Smoker  . Smokeless tobacco: Never Used  . Alcohol use No  . Drug use: No  . Sexual activity: Yes   Other Topics Concern  . Not on file   Social History Narrative  . No narrative on file    Review of Systems: See HPI, otherwise negative ROS  Physical Exam: Temp 97.7 F (36.5 C)   LMP   (LMP Unknown)  General:   Alert,  pleasant and cooperative in NAD Head:  Normocephalic and atraumatic. Neck:  Supple; no masses or thyromegaly. Lungs:  Clear throughout to auscultation.    Heart:  Regular rate and rhythm. Abdomen:  Soft, nontender and nondistended. Normal bowel sounds, without guarding, and without rebound.   Neurologic:  Alert and  oriented x4;  grossly normal neurologically.  Impression/Plan: Christina [name] is here for an endoscopy to be performed for abnormal ct scan showing a lesion in the cardia of the stomach  Risks, benefits, limitations, and alternatives regarding  endoscopy have been reviewed with the patient.  Questions have been answered.  All parties agreeable.   Jonathon Bellows, MD  11/11/2016, 7:58 AM

## 2016-11-11 NOTE — Transfer of Care (Signed)
Immediate Anesthesia Transfer of Care Note  Patient: Christina [name]  Procedure(s) Performed: Procedure(s): ESOPHAGOGASTRODUODENOSCOPY (EGD) WITH PROPOFOL (N/A)  Patient Location: PACU  Anesthesia Type:General  Level of Consciousness: sedated and responds to stimulation  Airway & Oxygen Therapy: Patient Spontanous Breathing and Patient connected to nasal cannula oxygen  Post-op Assessment: Report given to RN and Post -op Vital signs reviewed and stable  Post vital signs: Reviewed and stable  Last Vitals:  Vitals:   11/11/16 0804 11/11/16 0828  BP: 114/64 (!) 93/57  Pulse: 63 (!) 59  Resp: 20 20  Temp: 36.5 C     Last Pain:  Vitals:   11/11/16 0804  TempSrc: Tympanic         Complications: No apparent anesthesia complications

## 2016-11-11 NOTE — Anesthesia Post-op Follow-up Note (Cosign Needed)
Anesthesia QCDR form completed.        

## 2016-11-11 NOTE — Telephone Encounter (Signed)
  Oncology Nurse Navigator Documentation Scheduled for EUS with Dr. Cephas Darby next Tuesday, February 27, at 11:45 am.   Navigator Location: [facility] (11/11/16 1500)   )Navigator Encounter Type: Telephone;Letter/Fax/Email (11/11/16 1500)                             Interventions: Coordination of Care (11/11/16 1500)   Coordination of Care: EUS (11/11/16 1500)                  Time Spent with Patient: 30 (11/11/16 1500)

## 2016-11-12 ENCOUNTER — Encounter: Payer: Self-pay | Admitting: Gastroenterology

## 2016-11-12 ENCOUNTER — Inpatient Hospital Stay: Payer: Managed Care, Other (non HMO)

## 2016-11-12 VITALS — BP 101/68 | HR 102 | Temp 97.5°F | Resp 16

## 2016-11-12 DIAGNOSIS — C8331 Diffuse large B-cell lymphoma, lymph nodes of head, face, and neck: Secondary | ICD-10-CM

## 2016-11-12 DIAGNOSIS — Z5112 Encounter for antineoplastic immunotherapy: Secondary | ICD-10-CM | POA: Diagnosis not present

## 2016-11-12 LAB — SURGICAL PATHOLOGY

## 2016-11-12 MED ORDER — SODIUM CHLORIDE 0.9 % IV SOLN
Freq: Once | INTRAVENOUS | Status: AC
  Administered 2016-11-12: 09:00:00 via INTRAVENOUS
  Filled 2016-11-12: qty 1000

## 2016-11-12 MED ORDER — SODIUM CHLORIDE 0.9 % IV SOLN
750.0000 mg/m2 | Freq: Once | INTRAVENOUS | Status: AC
  Administered 2016-11-12: 1400 mg via INTRAVENOUS
  Filled 2016-11-12: qty 20

## 2016-11-12 MED ORDER — DEXAMETHASONE SODIUM PHOSPHATE 10 MG/ML IJ SOLN
10.0000 mg | Freq: Once | INTRAMUSCULAR | Status: AC
  Administered 2016-11-12: 10 mg via INTRAVENOUS
  Filled 2016-11-12: qty 1

## 2016-11-12 MED ORDER — SODIUM CHLORIDE 0.9 % IV SOLN: 10.0000 mg | Freq: Once | INTRAVENOUS | Status: DC

## 2016-11-12 MED ORDER — PEGFILGRASTIM 6 MG/0.6ML ~~LOC~~ PSKT
6.0000 mg | PREFILLED_SYRINGE | Freq: Once | SUBCUTANEOUS | Status: AC
  Administered 2016-11-12: 6 mg via SUBCUTANEOUS
  Filled 2016-11-12: qty 0.6

## 2016-11-12 MED ORDER — SODIUM CHLORIDE 0.9% FLUSH
10.0000 mL | INTRAVENOUS | Status: DC | PRN
  Administered 2016-11-12: 10 mL
  Filled 2016-11-12: qty 10

## 2016-11-12 MED ORDER — DOXORUBICIN HCL CHEMO IV INJECTION 2 MG/ML
50.0000 mg/m2 | Freq: Once | INTRAVENOUS | Status: AC
  Administered 2016-11-12: 94 mg via INTRAVENOUS
  Filled 2016-11-12: qty 47

## 2016-11-12 MED ORDER — HEPARIN SOD (PORK) LOCK FLUSH 100 UNIT/ML IV SOLN
500.0000 [IU] | Freq: Once | INTRAVENOUS | Status: AC | PRN
  Administered 2016-11-12: 500 [IU]
  Filled 2016-11-12: qty 5

## 2016-11-12 MED ORDER — VINCRISTINE SULFATE CHEMO INJECTION 1 MG/ML
2.0000 mg | Freq: Once | INTRAVENOUS | Status: AC
  Administered 2016-11-12: 2 mg via INTRAVENOUS
  Filled 2016-11-12: qty 2

## 2016-11-12 MED ORDER — PALONOSETRON HCL INJECTION 0.25 MG/5ML
0.2500 mg | Freq: Once | INTRAVENOUS | Status: AC
  Administered 2016-11-12: 0.25 mg via INTRAVENOUS
  Filled 2016-11-12: qty 5

## 2016-11-15 ENCOUNTER — Telehealth: Payer: Self-pay | Admitting: *Deleted

## 2016-11-15 LAB — CHROMOSOME ANALYSIS, BONE MARROW

## 2016-11-15 NOTE — Telephone Encounter (Signed)
Patient called to report swelling of lower extremities and increased insomnia. Ativan has been ineffective. Please advise.

## 2016-11-15 NOTE — Telephone Encounter (Signed)
Called pt to let her know that the swelling is coming from steroids, also her trouble sleeping is due to steroids.  I asked her how the HA was and she is it is still there but she only takes tylenol  At most 2 times a day. She has melatonin at home to help with sleep and I told her that she can try that.  Asked her to keep feet elevated as much as possible and when she wakes it is better but when she gets back up on them it starts swelling again.  I told her that she could also try doxylamine succinate if she would like just 1/2 pill if she would like to try the OTC med.  Since she has melatonin and it worked in the past that is a good option also.  The steroid last dose is tom. She is taking it early am with food

## 2016-11-17 ENCOUNTER — Telehealth: Payer: Self-pay

## 2016-11-17 NOTE — Telephone Encounter (Signed)
Pt notified of results. Pt had the EUS yesterday at [facility] and wanted to make sure it would be okay with Dr. Janese Banks to start the antibiotics before receiving results of that test. Please advise so I may call pt back and send over rx's.

## 2016-11-17 NOTE — Telephone Encounter (Signed)
-----   Message from Jonathon Bellows, MD sent at 11/12/2016 12:33 PM EST ----- Christina [name],Please call the patient  Bx shows gastritis with H pylori infection   H pylori gastritis.   Suggest clarithromycin 500 mg PO BID, amoxicillin 1 gram TID, omeprazole 20 mg BID all for 14 days.  , check for penicillin allergy and will need repeat H pylori stool antigen to check for eradication after .    FYI [name] and Dr Janese Banks   Dr Jonathon Bellows  Gastroenterology/Hepatology Pager: [phone]

## 2016-11-18 NOTE — Telephone Encounter (Signed)
Christina [name]   It should be ok to start the antibiotics asap for H pylori gastritis/ulcer . I have CC Dr Janese Banks about it .    Dr Jonathon Bellows  Gastroenterology/Hepatology Pager: [phone]

## 2016-11-19 ENCOUNTER — Other Ambulatory Visit: Payer: Self-pay

## 2016-11-19 ENCOUNTER — Telehealth: Payer: Self-pay | Admitting: *Deleted

## 2016-11-19 MED ORDER — CLARITHROMYCIN 500 MG PO TABS: 500.0000 mg | ORAL_TABLET | Freq: Two times a day (BID) | ORAL | 0 refills | Status: DC

## 2016-11-19 MED ORDER — OMEPRAZOLE 20 MG PO CPDR: 20.0000 mg | DELAYED_RELEASE_CAPSULE | Freq: Two times a day (BID) | ORAL | 0 refills | Status: DC

## 2016-11-19 MED ORDER — AMOXICILLIN 500 MG PO CAPS: 1000.0000 mg | ORAL_CAPSULE | Freq: Two times a day (BID) | ORAL | 0 refills | Status: DC

## 2016-11-19 NOTE — Telephone Encounter (Signed)
Pt notified per Dr. Vicente Males, she should be okay to go ahead and start antibiotics to treat the H pylori. Left message with Sherri at the cancer center I have spoken with the pt regarding this.

## 2016-11-19 NOTE — Telephone Encounter (Signed)
Pt called today to see if her bx results were back and they are not. She also asked about going to the atb that Dr. Vicente Males office called with and she wanted Dr. Janese Banks to be ok before she would start them.  I called her back to say that Dr. Janese Banks is ok with atb and it was for H pylori which is a bacteria that some people get in the GI tract and the atb are the exxacat ones she should take  fot it. The other med was omeprazole and Dr. Janese Banks was going to put her on that after she had biopsy and since Dr. Vicente Males put her on it for the ulcer that is great also. She will go and pick them up today and start. She is feeling tired today and probably from the coming off the 5 days of steroids. She took last dose Tues. And then she is constipated and she is taking stool softner each day and today she took dose of miralax and nothing. I told her that she could take another dose of miralax or try a dose of MOM.  She will try to leave work early today because she is fatigued and try another dose of miralax. We will let her know when we get path back on the EUS bx she had.

## 2016-11-21 ENCOUNTER — Emergency Department: Payer: Managed Care, Other (non HMO)

## 2016-11-21 ENCOUNTER — Emergency Department
Admission: EM | Admit: 2016-11-21 | Discharge: 2016-11-21 | Disposition: A | Discharge status: Home / Self Care | Payer: Managed Care, Other (non HMO) | Attending: Emergency Medicine | Admitting: Emergency Medicine

## 2016-11-21 ENCOUNTER — Encounter: Payer: Self-pay | Admitting: Emergency Medicine

## 2016-11-21 ENCOUNTER — Other Ambulatory Visit: Payer: Self-pay

## 2016-11-21 DIAGNOSIS — K5900 Constipation, unspecified: Secondary | ICD-10-CM | POA: Insufficient documentation

## 2016-11-21 DIAGNOSIS — R1030 Lower abdominal pain, unspecified: Secondary | ICD-10-CM | POA: Diagnosis present

## 2016-11-21 DIAGNOSIS — R1084 Generalized abdominal pain: Secondary | ICD-10-CM

## 2016-11-21 LAB — COMPREHENSIVE METABOLIC PANEL
ALBUMIN: 3.8 g/dL (ref 3.5–5.0)
ALT: 29 U/L (ref 14–54)
ANION GAP: 8 (ref 5–15)
AST: 31 U/L (ref 15–41)
Alkaline Phosphatase: 101 U/L (ref 38–126)
BUN: 7 mg/dL (ref 6–20)
CHLORIDE: 106 mmol/L (ref 101–111)
CO2: 24 mmol/L (ref 22–32)
Calcium: 9.1 mg/dL (ref 8.9–10.3)
Creatinine, Ser: 0.87 mg/dL (ref 0.44–1.00)
GFR calc Af Amer: >60 mL/min (ref >60)
Glucose, Bld: 101 mg/dL — ABNORMAL HIGH (ref 65–99)
POTASSIUM: 4.4 mmol/L (ref 3.5–5.1)
Sodium: 138 mmol/L (ref 135–145)
Total Bilirubin: 0.4 mg/dL (ref 0.3–1.2)
Total Protein: 7.1 g/dL (ref 6.5–8.1)

## 2016-11-21 LAB — URINALYSIS, COMPLETE (UACMP) WITH MICROSCOPIC
BILIRUBIN URINE: NEGATIVE
Glucose, UA: NEGATIVE mg/dL
KETONES UR: NEGATIVE mg/dL
Leukocytes, UA: NEGATIVE
Nitrite: NEGATIVE
PROTEIN: NEGATIVE mg/dL
SPECIFIC GRAVITY, URINE: 1.005 (ref 1.005–1.030)
pH: 7 (ref 5.0–8.0)

## 2016-11-21 LAB — TROPONIN I

## 2016-11-21 LAB — CBC
HEMATOCRIT: 36.2 % (ref 35.0–47.0)
HEMOGLOBIN: 12.1 g/dL (ref 12.0–16.0)
MCH: 28.5 pg (ref 26.0–34.0)
MCHC: 33.5 g/dL (ref 32.0–36.0)
MCV: 85.1 fL (ref 80.0–100.0)
Platelets: 109 10*3/uL — ABNORMAL LOW (ref 150–440)
RBC: 4.26 MIL/uL (ref 3.80–5.20)
RDW: 12.7 % (ref 11.5–14.5)
WBC: 12.4 10*3/uL — AB (ref 3.6–11.0)

## 2016-11-21 LAB — LIPASE, BLOOD

## 2016-11-21 MED ORDER — SODIUM CHLORIDE 0.9 % IV BOLUS (SEPSIS)
1000.0000 mL | Freq: Once | INTRAVENOUS | Status: AC
  Administered 2016-11-21: 1000 mL via INTRAVENOUS

## 2016-11-21 NOTE — ED Notes (Signed)
Patient transported to X-ray 

## 2016-11-21 NOTE — Discharge Instructions (Signed)
? ?  Please return to the emergency room right away if you are to develop a fever, severe nausea, your pain becomes severe or worsens, you are unable to keep food down, begin vomiting any dark or bloody fluid, you develop any dark or bloody stools, feel dehydrated, or other new concerns or symptoms arise. ? ?

## 2016-11-21 NOTE — ED Triage Notes (Signed)
Pt recently started chemo had first treatment on 2/22 and 2/23. Pt states she has not had a BM in 10 days which the chemo causes constipation. Pt c/o pain in low abdomen, rectum and chest pain. Pt states she has tried OTC meds, was prescribed lactulose, but no relief. Pt is passing gas. Pt states today she started having blood when she urinated, not sure if it from her urethra or rectum. Pt states she felt like she may have had a UTI as well.

## 2016-11-21 NOTE — ED Notes (Signed)
Rectal exam completed on patient at this time with Dr. Jacqualine Code and this RN. Patient tolerated well. No blood noted in stool by MD

## 2016-11-21 NOTE — ED Provider Notes (Signed)
[facility] Emergency Department Provider Note   ____________________________________________   First MD Initiated Contact with Patient 11/21/16 1111     (approximate)  I have reviewed the triage vital signs and the nursing notes.   HISTORY  Chief Complaint Constipation; Hematuria; and Chest Pain    HPI Christina [name] is a 56 y.o. female for evaluation of "constipation".  Patient reports that last week she began her first chemotherapy, she was told that she may constipation from the treatment. She also reports irregular bowel movements often having a movement about once only every 7 days previous. Today she got about 10 days, continues to pass gas, but feels bloated and constipated with discomfort in her lower abdomen.  Today while attempting to use the bathroom she noticed blood in her stool, potentially in her urine somewhat hard to tell. This has gone away, but she reports seeing red blood at this time.  No fevers or chills. No chest pain or shortness of breath.  No nausea vomiting, and she continues to eat that feels very full.     Past Medical History:  Diagnosis Date  . Cancer ([place])    lymphoma, non hodgekins B cell    Patient Active Problem List   Diagnosis Date Noted  . Goals of care, counseling/discussion 11/11/2016  . 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 ABDOMINAL HYSTERECTOMY    . CESAREAN SECTION     x 2   . ESOPHAGOGASTRODUODENOSCOPY (EGD) WITH PROPOFOL N/A 11/11/2016   Procedure: ESOPHAGOGASTRODUODENOSCOPY (EGD) WITH PROPOFOL;  Surgeon: Jonathon Bellows, MD;  Location: [facility] ENDOSCOPY;  Service: Endoscopy;  Laterality: N/A;  . IR GENERIC HISTORICAL  11/03/2016   IR FLUORO GUIDE PORT INSERTION RIGHT 11/03/2016 Aletta Edouard, MD [facility]-INTERV RAD  . REFRACTIVE SURGERY  09/2015    Prior to Admission medications   Medication Sig Start Date End  Date Taking? Authorizing Provider  allopurinol (ZYLOPRIM) 300 MG tablet Take 1 tablet (300 mg total) by mouth daily. 11/11/16  Yes Sindy Guadeloupe, MD  amoxicillin (AMOXIL) 500 MG capsule Take 2 capsules (1,000 mg total) by mouth 2 (two) times daily. 11/19/16  Yes Jonathon Bellows, MD  clarithromycin (BIAXIN) 500 MG tablet Take 1 tablet (500 mg total) by mouth 2 (two) times daily. 11/19/16  Yes Jonathon Bellows, MD  Lactulose 20 GM/30ML SOLN Take 30 mLs by mouth every 8 (eight) hours.   Yes Historical Provider, MD  lidocaine-prilocaine (EMLA) cream Apply cream 1 hour before chemotherapy treatment, place a small piece of saran wrap over cream to protect clothing 11/07/16  Yes Sindy Guadeloupe, MD  omeprazole (PRILOSEC) 20 MG capsule Take 1 capsule (20 mg total) by mouth 2 (two) times daily before a meal. 11/19/16  Yes Jonathon Bellows, MD  ondansetron (ZOFRAN) 8 MG tablet Take 1 tablet (8 mg total) by mouth 2 (two) times daily as needed for refractory nausea / vomiting. Starting on day 3 after each cyclophosphamide chemotherapy treatment 11/11/16  Yes Sindy Guadeloupe, MD  estradiol (CLIMARA - DOSED IN MG/24 HR) 0.05 mg/24hr patch once a week. 08/30/16   Historical Provider, MD  Asencion Islam ALLERGY RELIEF 50 MCG/ACT nasal spray  10/26/16   Historical Provider, MD  LORazepam (ATIVAN) 0.5 MG tablet Take 1 tablet (0.5 mg total) by mouth every 6 (six) hours as needed (nausea/vomiting). Patient not taking: Reported on 11/21/2016 11/11/16   Sindy Guadeloupe, MD  predniSONE (DELTASONE) 50 MG tablet Take 2 tablets (100 mg total) by mouth daily with breakfast. For 5 days with each treatment of chemotherapy Patient not taking: Reported on 11/21/2016 11/11/16   Sindy Guadeloupe, MD  prochlorperazine (COMPAZINE) 10 MG tablet Take 1 tablet (10 mg total) by mouth every 6 (six) hours as needed for nausea or vomiting. Patient not taking: Reported on 11/21/2016 11/11/16   Sindy Guadeloupe, MD    Allergies Patient has no known allergies.  Family History  Problem Relation  Age of Onset  . Cancer Mother     breast  . Cancer Father     lung  . Heart disease Brother 40    MI  . Heart disease Paternal Grandfather     MI    Social History Social History  Substance Use Topics  . Smoking status: Never Smoker  . Smokeless tobacco: Never Used  . Alcohol use No    Review of Systems Constitutional: No fever/chills Eyes: No visual changes. ENT: No sore throat. Cardiovascular: Denies chest pain. Respiratory: Denies shortness of breath. Gastrointestinal: No vomiting.  No diarrhea.  No constipation. Genitourinary: Negative for dysuria though she did notice some blood when she urinated are tried to have a bowel movement today, and she wants to make sure she doesn't have a "UTI". Musculoskeletal: Negative for back pain. Skin: Negative for rash. Neurological: Negative for headaches, focal weakness or numbness.  10-point ROS otherwise negative.  ____________________________________________   PHYSICAL EXAM:  VITAL SIGNS: ED Triage Vitals  Enc Vitals Group     BP 11/21/16 1015 113/75     Pulse Rate 11/21/16 1015 88     Resp 11/21/16 1015 18     Temp 11/21/16 1015 99 F (37.2 C)     Temp Source 11/21/16 1015 Oral     SpO2 11/21/16 1015 98 %     Weight --      Height --      Head Circumference --      Peak Flow --      Pain Score 11/21/16 1011 10     Pain Loc --      Pain Edu? --      Excl. in [place]? --     Constitutional: Alert and oriented. Well appearing and in no acute distress. Eyes: Conjunctivae are normal. PERRL. EOMI. Head: Atraumatic. Nose: No congestion/rhinnorhea. Mouth/Throat: Mucous membranes are moist.  Oropharynx non-erythematous. Neck: No stridor.   Cardiovascular: Normal rate, regular rhythm. Grossly normal heart sounds.  Good peripheral circulation. Respiratory: Normal respiratory effort.  No retractions. Lungs CTAB. Gastrointestinal: Soft and Seems minimally distended, but soft. Notable old positive bowel sounds. Mild  tenderness over the left mid to lower abdomen. No rebound or guarding is apparent. No focal pain in McBurney's point. Negative Murphy. No abdominal bruits. No CVA tenderness. Rectal exam performed with nurse Amber present. The patient has brown formed stool in the rectum, negative for blood by visual inspection and negative Hemoccult at this time with a positive control Musculoskeletal: No lower extremity tenderness nor edema.  No joint effusions. Previous marrow biopsy site over the right lower back is clean dry and intact. Neurologic:  Normal speech and language. No gross focal neurologic deficits are appreciated. Skin:  Skin is warm, dry and intact. No rash noted. Psychiatric: Mood and affect are normal. Speech and behavior are normal.  ____________________________________________   LABS (all labs ordered are listed, but only abnormal results are displayed)  Labs Reviewed  LIPASE, BLOOD -  Abnormal; Notable for the following:       Result Value   Lipase <10 (*)    All other components within normal limits  COMPREHENSIVE METABOLIC PANEL - Abnormal; Notable for the following:    Glucose, Bld 101 (*)    All other components within normal limits  CBC - Abnormal; Notable for the following:    WBC 12.4 (*)    Platelets 109 (*)    All other components within normal limits  URINALYSIS, COMPLETE (UACMP) WITH MICROSCOPIC - Abnormal; Notable for the following:    Color, Urine STRAW (*)    APPearance HAZY (*)    Hgb urine dipstick SMALL (*)    Bacteria, UA RARE (*)    Squamous Epithelial / LPF 0-5 (*)    Non Squamous Epithelial 0-5 (*)    All other components within normal limits  URINE CULTURE  TROPONIN I   ____________________________________________  EKG  ED ECG REPORT I, QUALE, MARK, the attending physician, personally viewed and interpreted this ECG.  Date: 11/21/2016 EKG Time: 1010 Rate: 90 Rhythm: normal sinus rhythm QRS Axis: normal Intervals: normal ST/T Wave  abnormalities: normal Conduction Disturbances: none Narrative Interpretation: unremarkable  ____________________________________________  RADIOLOGY  Dg Abd 2 Views  Result Date: 11/21/2016 CLINICAL DATA:  No bowel movement for 10 days.  Chemotherapy. EXAM: ABDOMEN - 2 VIEW COMPARISON:  None. FINDINGS: The distal end of a right central line terminates at the caval atrial junction. Minimal opacity in left lung base may represent atelectasis. No free air, portal venous gas, or pneumatosis. Fecal loading from the mid transverse colon through the rectum. No evidence of bowel obstruction. Phleboliths in the pelvis. IMPRESSION: Fecal loading from the mid transverse colon through the rectum. No other acute abnormalities. Electronically Signed   By: Dorise Bullion III M.D   On: 11/21/2016 12:53    ____________________________________________   PROCEDURES  Procedure(s) performed: None  Procedures  Critical Care performed: No  ____________________________________________   INITIAL IMPRESSION / ASSESSMENT AND PLAN / ED COURSE  Pertinent labs & imaging results that were available during my care of the patient were reviewed by me and considered in my medical decision making (see chart for details).  Patient presents for a feeling of being very constipated, bloated after chemotherapy. Denies any infectious symptoms. No cardiac or pulmonary symptoms. No neurologic symptoms. She is alert well appearing and in no distress, but does appear to have mild abdominal tenderness and slight distention.  She tried multiple treatments at home with no success to relieve constipation. We will start by hydrating her, reviewing labs, and obtaining abdominal x-rays to evaluate for possible obstructive etiology or heavy constipation burn. She had 2 bowel movements, both of which were normal without any blood noted.  Discussed risks and benefits of CT scan, patient reports he feels much much better after having  bowel movements. I think at this point is shared medical decision making is reasonable to discharge the patient with close follow-up and careful return precautions. She is very agreeable with the plan.  Clinical Course as of Nov 21 1449  Sun Nov 21, 2016  1448 Patient had 2 bowel movements, reports she feels much better. She appears comfortable. Relax, reporting no further pain or tenderness on repeat abdominal exam.  Return precautions and treatment recommendations and follow-up discussed with the patient who is agreeable with the plan.  There is strict abdominal return precautions were discussed with the patient and her husband. She has follow-up with her oncology team  on Tuesday morning.  [MQ]    Clinical Course User Index [MQ] Delman Kitten, MD     ____________________________________________   FINAL CLINICAL IMPRESSION(S) / ED DIAGNOSES  Final diagnoses:  Generalized abdominal pain  Constipation, unspecified constipation type      NEW MEDICATIONS STARTED DURING THIS VISIT:  New Prescriptions   No medications on file     Note:  This document was prepared using Dragon voice recognition software and may include unintentional dictation errors.     Delman Kitten, MD 11/21/16 1452

## 2016-11-22 ENCOUNTER — Other Ambulatory Visit: Payer: Managed Care, Other (non HMO)

## 2016-11-22 ENCOUNTER — Encounter (HOSPITAL_COMMUNITY): Payer: Self-pay

## 2016-11-22 LAB — URINE CULTURE: SPECIAL REQUESTS: NORMAL

## 2016-11-22 NOTE — Progress Notes (Signed)
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 Her symptoms of an ongoing since December 2017. Patient works at a [employer] (medial in the swelling but the swelling did not go away. She was then referred to ENT.   2. CT of the soft tissue neck with contrast showed malignant right leg lymphadenopathy with heterogeneous enhancement and extracapsular extension occupying both the right level II and level III nodal stations. Individually B abnormal lymph nodes measure up to 4.2 cm in largest dimension and the conglomerulationof abnormal lymph nodes is 5-5.5 cm across. There is a small but asymmetric and conspicuity 7 mm lymph node along the lower right 3-Bstation. Mass effect on the right carotid space from the abnormal nodes but the major vascular structures in the neck at the skull base including the right IJ remained patent.  3. 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 Cells were CD20 positive and BCL 6 and partially dim BCL-2 positive. B cells were negative for C myc (10%). Mom one was positive in 50% of the cells. Ki-67 was 50%.Bone marrow biopsy was negative for lymphoma involvement  4. Patient is otherwise doing well and denies any symptoms of fevers, chills, unintentional weight loss or drenching night sweats. She does not have any significant comorbidities.  Currently reports dry non productive cough for last 1 week  5. PET/CT showed IMPRESSION: 1. FDG avid malignancy is identified in the level 2 and 3 nodal stations of the right cervical lymph node chain, consistent with the patient's known lymphoma. 2. Focal uptake in the gastric cardia is suspicious for malignancy as well. Recommend direct visualization and biopsy as clinically Warranted.  6. EGD showed diffuse moderately erythematous mucosa without bleeding initial nonbleeding gastric ulcer 6 mm which was biopsied. Findings were consistent with H. pylori gastritis and patient was started on antibiotic therapy for the same   Interval history- patient tolerated her cycle 1 of chemotherapy relatively well. She did have bone pain and myalgias one week after chemotherapy for which she tried to use gummy bears laced with marijuana. She did not think that this helped her one way or the other. She has also been taking Zofran on a standing basis. She reports having severe constipation which did not improve with laxatives and senna as well as enema. She was prescribed lactulose over the weekend which eventually helped her move her bowels. She did not have any nausea vomiting postchemotherapy. She has also been having hot flashes and she has had prior to starting chemotherapy. She was prescribed estrogen patches in the past have been on hold currently by her GYN. Patient also took her prescribed antibiotic course for age pylori incorrectly and landed of taking twice the dose on a couple of days.  ECOG PS- 0 Pain scale- 0 Opioid associated constipation- no  Review of systems- Review of Systems  Constitutional: Positive for malaise/fatigue. Negative for chills, fever and weight loss.  HENT: Negative  for congestion, ear discharge and nosebleeds.   Eyes: Negative for blurred vision.  Respiratory: Negative for cough, hemoptysis, sputum production, shortness of breath and wheezing.   Cardiovascular: Negative  for chest pain, palpitations, orthopnea and claudication.  Gastrointestinal: Positive for abdominal pain. Negative for blood in stool, constipation, diarrhea, heartburn, melena, nausea and vomiting.  Genitourinary: Negative for dysuria, flank pain, frequency, hematuria and urgency.  Musculoskeletal: Negative for back pain, joint pain and myalgias.  Skin: Negative for rash.  Neurological: Negative for dizziness, tingling, focal weakness, seizures, weakness and headaches.  Endo/Heme/Allergies: Does not bruise/bleed easily.  Psychiatric/Behavioral: Negative for depression and suicidal ideas. The patient does not have insomnia.      Current treatment- s/p cycle 1 of RCHOP  No Known Allergies   Past Medical History:  Diagnosis Date  . Cancer ([place])    lymphoma, non hodgekins B cell     Past Surgical History:  Procedure Laterality Date  . ABDOMINAL HYSTERECTOMY    . CESAREAN SECTION     x 2   . ESOPHAGOGASTRODUODENOSCOPY (EGD) WITH PROPOFOL N/A 11/11/2016   Procedure: ESOPHAGOGASTRODUODENOSCOPY (EGD) WITH PROPOFOL;  Surgeon: Jonathon Bellows, MD;  Location: [facility] ENDOSCOPY;  Service: Endoscopy;  Laterality: N/A;  . IR GENERIC HISTORICAL  11/03/2016   IR FLUORO GUIDE PORT INSERTION RIGHT 11/03/2016 Aletta Edouard, MD [facility]-INTERV RAD  . REFRACTIVE SURGERY  09/2015    Social History   Social History  . Marital status: Single    Spouse name: N/A  . Number of children: N/A  . Years of education: N/A   Occupational History  . Not on file.   Social History Main Topics  . Smoking status: Never Smoker  . Smokeless tobacco: Never Used  . Alcohol use No  . Drug use: No  . Sexual activity: Yes   Other Topics Concern  . Not on file   Social History Narrative  . No narrative on file    Family History  Problem Relation Age of Onset  . Cancer Mother     breast  . Cancer Father     lung  . Heart disease Brother 15    MI  . Heart disease Paternal Grandfather     MI     Current  Outpatient Prescriptions:  .  allopurinol (ZYLOPRIM) 300 MG tablet, Take 1 tablet (300 mg total) by mouth daily., Disp: 30 tablet, Rfl: 1 .  amoxicillin (AMOXIL) 500 MG capsule, Take 2 capsules (1,000 mg total) by mouth 2 (two) times daily., Disp: 56 capsule, Rfl: 0 .  clarithromycin (BIAXIN) 500 MG tablet, Take 1 tablet (500 mg total) by mouth 2 (two) times daily., Disp: 28 tablet, Rfl: 0 .  estradiol (CLIMARA - DOSED IN MG/24 HR) 0.05 mg/24hr patch, once a week., Disp: , Rfl:  .  FLONASE ALLERGY RELIEF 50 MCG/ACT nasal spray, , Disp: , Rfl:  .  Lactulose 20 GM/30ML SOLN, Take 30 mLs by mouth every 8 (eight) hours., Disp: , Rfl:  .  lidocaine-prilocaine (EMLA) cream, Apply cream 1 hour before chemotherapy treatment, place a small piece of saran wrap over cream to protect clothing, Disp: 30 g, Rfl: 1 .  LORazepam (ATIVAN) 0.5 MG tablet, Take 1 tablet (0.5 mg total) by mouth every 6 (six) hours as needed (nausea/vomiting). (Patient not taking: Reported on 11/21/2016), Disp: 30 tablet, Rfl: 0 .  omeprazole (PRILOSEC) 20 MG capsule, Take 1 capsule (20 mg total) by mouth 2 (two) times daily before a meal., Disp: 28 capsule, Rfl: 0 .  ondansetron (ZOFRAN) 8 MG tablet, Take 1 tablet (8 mg total) by mouth 2 (two) times daily as needed for refractory nausea / vomiting. Starting on day 3 after each cyclophosphamide chemotherapy treatment, Disp: 30 tablet, Rfl: 1 .  predniSONE (DELTASONE) 50 MG tablet, Take 2 tablets (100 mg total) by mouth daily with breakfast. For 5 days with each treatment of chemotherapy (Patient not taking: Reported on 11/21/2016), Disp: 10 tablet, Rfl: 3 .  prochlorperazine (COMPAZINE) 10 MG tablet, Take 1 tablet (10 mg total) by mouth every 6 (six) hours as needed for nausea or vomiting. (Patient not taking: Reported on 11/21/2016), Disp: 30 tablet, Rfl: 3  Physical exam:  Vitals:   11/23/16 0918  BP: 124/81  Pulse: 76  Resp: 20  Temp: 97.2 F (36.2 C)  TempSrc: Tympanic  Weight: 160  lb 0.9 oz (72.6 kg)   Physical Exam  Constitutional: She is oriented to person, place, and time and well-developed, well-nourished, and in no distress.  HENT:  Head: Normocephalic and atraumatic.  Eyes: EOM are normal. Pupils are equal, round, and reactive to light.  Neck: Normal range of motion.  Cardiovascular: Normal rate, regular rhythm and normal heart sounds.   Pulmonary/Chest: Effort normal and breath sounds normal.  Abdominal: Soft. Bowel sounds are normal.  Lymphadenopathy:  Right-sided cervical adenopathy still palpable but significantly smaller in size  Neurological: She is alert and oriented to person, place, and time.  Skin: Skin is warm and dry.     CMP Latest Ref Rng & Units 11/21/2016  Glucose 65 - 99 mg/dL 101(H)  BUN 6 - 20 mg/dL 7  Creatinine 0.44 - 1.00 mg/dL 0.87  Sodium 135 - 145 mmol/L 138  Potassium 3.5 - 5.1 mmol/L 4.4  Chloride 101 - 111 mmol/L 106  CO2 22 - 32 mmol/L 24  Calcium 8.9 - 10.3 mg/dL 9.1  Total Protein 6.5 - 8.1 g/dL 7.1  Total Bilirubin 0.3 - 1.2 mg/dL 0.4  Alkaline Phos 38 - 126 U/L 101  AST 15 - 41 U/L 31  ALT 14 - 54 U/L 29   CBC Latest Ref Rng & Units 11/21/2016  WBC 3.6 - 11.0 K/uL 12.4(H)  Hemoglobin 12.0 - 16.0 g/dL 12.1  Hematocrit 35.0 - 47.0 % 36.2  Platelets 150 - 440 K/uL 109(L)    No images are attached to the encounter.  Nm Pet Image Initial (pi) Skull Base To Thigh  Result Date: 11/08/2016 CLINICAL DATA:  Diffuse large B-cell lymphoma in the neck. Initial staging. Recent port placement. Biopsy of a right neck lymph node October 18, 2016. Bone marrow biopsy in the right pelvis on November 03, 2016. EXAM: NUCLEAR MEDICINE PET SKULL BASE TO THIGH TECHNIQUE: 11.69 mCi F-18 FDG was injected intravenously. Full-ring PET imaging was performed from the skull base to thigh after the radiotracer. CT data was obtained and used for attenuation correction and anatomic localization. FASTING BLOOD GLUCOSE:  Value: 94 mg/dl COMPARISON:  CT  of the neck October 01, 2016 FINDINGS: NECK Again noted are abnormal lymph nodes in the level 2 and 3 right cervical nodal stations, involving a and b sub sets at these levels. The lymph nodes were better measure on the recent contrast-enhanced CT scan. The index node on the right on series 3, image 25 correlates with the 2.2 cm node on the recent CT scan and demonstrates a maximum SUV of 18.6. The 7 mm node along the lowest right level IIIb station on the recent CT scan is not FDG  avid. No FDG avid disease is seen at levels 1, 4, 5, 6, or 7. CHEST No hypermetabolic mediastinal or hilar nodes. No suspicious pulmonary nodules on the CT scan. ABDOMEN/PELVIS There is focal uptake centered in the gastric cardia with a maximum SUV of 8.5. No abnormal uptake in the spleen. No FDG-avid adenopathy seen within the abdomen or pelvis. No other FDG avid disease in the abdomen or pelvis. SKELETON No focal hypermetabolic activity to suggest skeletal metastasis. IMPRESSION: 1. FDG avid malignancy is identified in the level 2 and 3 nodal stations of the right cervical lymph node chain, consistent with the patient's known lymphoma. 2. Focal uptake in the gastric cardia is suspicious for malignancy as well. Recommend direct visualization and biopsy as clinically warranted. Electronically Signed   By: Dorise Bullion III M.D   On: 11/08/2016 13:47   Dg Abd 2 Views  Result Date: 11/21/2016 CLINICAL DATA:  No bowel movement for 10 days.  Chemotherapy. EXAM: ABDOMEN - 2 VIEW COMPARISON:  None. FINDINGS: The distal end of a right central line terminates at the caval atrial junction. Minimal opacity in left lung base may represent atelectasis. No free air, portal venous gas, or pneumatosis. Fecal loading from the mid transverse colon through the rectum. No evidence of bowel obstruction. Phleboliths in the pelvis. IMPRESSION: Fecal loading from the mid transverse colon through the rectum. No other acute abnormalities. Electronically  Signed   By: Dorise Bullion III M.D   On: 11/21/2016 12:53   Ct Bone Marrow Biopsy & Aspiration  Result Date: 11/03/2016 CLINICAL DATA:  Diffuse large B-cell lymphoma of neck. The patient requires bone marrow biopsy. EXAM: CT GUIDED BONE MARROW ASPIRATION AND BIOPSY ANESTHESIA/SEDATION: Versed 2.0 mg IV, Fentanyl 100 mcg IV Total Moderate Sedation Time:  16 minutes. The patient's level of consciousness and physiologic status were continuously monitored during the procedure by Radiology nursing. PROCEDURE: The procedure risks, benefits, and alternatives were explained to the patient. Questions regarding the procedure were encouraged and answered. The patient understands and consents to the procedure. A time out was performed prior to initiating the procedure. The right gluteal region was prepped with chlorhexidine. Sterile gown and sterile gloves were used for the procedure. Local anesthesia was provided with 1% Lidocaine. Under CT guidance, an 11 gauge On Control bone cutting needle was advanced from a posterior approach into the right iliac bone. Needle positioning was confirmed with CT. Initial non heparinized and heparinized aspirate samples were obtained of bone marrow. Core biopsy was performed via the On Control drill needle. COMPLICATIONS: None FINDINGS: Inspection of initial aspirate did reveal visible particles. Intact core biopsy sample was obtained. IMPRESSION: CT guided bone marrow biopsy of right posterior iliac bone with both aspirate and core samples obtained. Electronically Signed   By: Aletta Edouard M.D.   On: 11/03/2016 15:41   Ir Fluoro Guide Port Insertion Right  Result Date: 11/03/2016 CLINICAL DATA:  Diffuse large B-cell lymphoma. The patient requires a porta cath to begin chemotherapy. EXAM: IMPLANTED PORT A CATH PLACEMENT WITH ULTRASOUND AND FLUOROSCOPIC GUIDANCE ANESTHESIA/SEDATION: 1.0 mg IV Versed; 50 mcg IV Fentanyl Total Moderate Sedation Time:  52 minutes The patient's level  of consciousness and physiologic status were continuously monitored during the procedure by Radiology nursing. Additional Medications: 2 g IV Ancef. As antibiotic prophylaxis, Ancef was ordered pre-procedure and administered intravenously within one hour of incision. FLUOROSCOPY TIME:  36 seconds.  5.0 mGy. PROCEDURE: The procedure, risks, benefits, and alternatives were explained to the patient. Questions  regarding the procedure were encouraged and answered. The patient understands and consents to the procedure. A time-out was performed prior to initiating the procedure. Ultrasound was utilized to confirm patency of the right internal jugular vein. The right neck and chest were prepped with chlorhexidine in a sterile fashion, and a sterile drape was applied covering the operative field. Maximum barrier sterile technique with sterile gowns and gloves were used for the procedure. Local anesthesia was provided with 1% lidocaine. After creating a small venotomy incision, a 21 gauge needle was advanced into the right internal jugular vein under direct, real-time ultrasound guidance. Ultrasound image documentation was performed. After securing guidewire access, an 8 Fr dilator was placed. A J-wire was kinked to measure appropriate catheter length. A subcutaneous port pocket was then created along the upper chest wall utilizing sharp and blunt dissection. Portable cautery was utilized. The pocket was irrigated with sterile saline. A single lumen power injectable port was chosen for placement. The 8 Fr catheter was tunneled from the port pocket site to the venotomy incision. The port was placed in the pocket. External catheter was trimmed to appropriate length based on guidewire measurement. At the venotomy, an 8 Fr peel-away sheath was placed over a guidewire. The catheter was then placed through the sheath and the sheath removed. Final catheter positioning was confirmed and documented with a fluoroscopic spot image. The  port was accessed with a needle and aspirated and flushed with heparinized saline. The access needle was removed. The venotomy and port pocket incisions were closed with subcutaneous 4-0 Monocryl and subcuticular 4-0 Monocryl. Dermabond was applied to both incisions. COMPLICATIONS: COMPLICATIONS None FINDINGS: After catheter placement, the tip lies at the cavo-atrial junction. The catheter aspirates normally and is ready for immediate use. IMPRESSION: Placement of single lumen port a cath via right internal jugular vein. The catheter tip lies at the cavo-atrial junction. A power injectable port a cath was placed and is ready for immediate use. Electronically Signed   By: Aletta Edouard M.D.   On: 11/03/2016 15:42     Assessment and plan- Patient is a 56 y.o. female with a h/o stage I DLBCL germinal center type  1. I discussed the results of EGD and EUS with the patient in detail. There was no evidence of gastric mass found at the time of EUS. She does have evidence of H Pylori gastritis which explains the ulcer and erythema found in her gastric mucosa. Hence, I do not think theer is any conclusive evidence of gastric involvement with DLBCL. Patient only has cervical LN involvement on the right side which makes this Stage 1 DLBCL. I will plan to give her 3 cycles of RCHOP and repeat PET/CT after that. If she is NED at that time, I will refer her to rad Onc for IFRT at that time  2. Patient will see me next week with CBC and met C on the day of cycle #2 of R- CHOP.  3 patient-base with GI regarding her antibiotic course for H. pylori which she took in excess in error. She has also not had a screening colonoscopy which she will schedule after chemoradiation  4. Constipation- possibly secondary to Zofran. I have the patient to stop taking Zofran and use antinausea medications such as Compazine and Ativan only as needed and not on a standing basis. She will be taking lactulose and senna if she does not have  bowel movement for more than one day.    5. And encouraged the patient  not to use marijuana for myalgias and bone pain and try tylenol and claritin instead. It could be secondary to neulasta   Visit Diagnosis 1. 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HCC)      Dr. Randa Evens, MD, MPH [facility] Pager- [phone] 11/22/2016 11:21 AM

## 2016-11-23 ENCOUNTER — Inpatient Hospital Stay: Payer: 59

## 2016-11-23 ENCOUNTER — Inpatient Hospital Stay: Payer: 59 | Attending: Oncology | Admitting: Oncology

## 2016-11-23 ENCOUNTER — Other Ambulatory Visit: Payer: Self-pay

## 2016-11-23 VITALS — BP 124/81 | HR 76 | Temp 97.2°F | Resp 20 | Wt 160.1 lb

## 2016-11-23 DIAGNOSIS — M542 Cervicalgia: Secondary | ICD-10-CM | POA: Diagnosis not present

## 2016-11-23 DIAGNOSIS — B9681 Helicobacter pylori [H. pylori] as the cause of diseases classified elsewhere: Secondary | ICD-10-CM | POA: Diagnosis not present

## 2016-11-23 DIAGNOSIS — K2970 Gastritis, unspecified, without bleeding: Secondary | ICD-10-CM

## 2016-11-23 DIAGNOSIS — K5900 Constipation, unspecified: Secondary | ICD-10-CM | POA: Insufficient documentation

## 2016-11-23 DIAGNOSIS — E860 Dehydration: Secondary | ICD-10-CM | POA: Diagnosis not present

## 2016-11-23 DIAGNOSIS — Z7689 Persons encountering health services in other specified circumstances: Secondary | ICD-10-CM | POA: Diagnosis not present

## 2016-11-23 DIAGNOSIS — M898X9 Other specified disorders of bone, unspecified site: Secondary | ICD-10-CM | POA: Insufficient documentation

## 2016-11-23 DIAGNOSIS — Z801 Family history of malignant neoplasm of trachea, bronchus and lung: Secondary | ICD-10-CM | POA: Diagnosis not present

## 2016-11-23 DIAGNOSIS — R05 Cough: Secondary | ICD-10-CM

## 2016-11-23 DIAGNOSIS — Z5111 Encounter for antineoplastic chemotherapy: Secondary | ICD-10-CM | POA: Diagnosis not present

## 2016-11-23 DIAGNOSIS — Z79899 Other long term (current) drug therapy: Secondary | ICD-10-CM

## 2016-11-23 DIAGNOSIS — M791 Myalgia: Secondary | ICD-10-CM | POA: Insufficient documentation

## 2016-11-23 DIAGNOSIS — R079 Chest pain, unspecified: Secondary | ICD-10-CM | POA: Insufficient documentation

## 2016-11-23 DIAGNOSIS — Z5112 Encounter for antineoplastic immunotherapy: Secondary | ICD-10-CM | POA: Diagnosis not present

## 2016-11-23 DIAGNOSIS — C8331 Diffuse large B-cell lymphoma, lymph nodes of head, face, and neck: Secondary | ICD-10-CM

## 2016-11-23 DIAGNOSIS — Z803 Family history of malignant neoplasm of breast: Secondary | ICD-10-CM | POA: Insufficient documentation

## 2016-11-23 DIAGNOSIS — C8338 Diffuse large B-cell lymphoma, lymph nodes of multiple sites: Secondary | ICD-10-CM | POA: Insufficient documentation

## 2016-11-23 DIAGNOSIS — H9203 Otalgia, bilateral: Secondary | ICD-10-CM | POA: Insufficient documentation

## 2016-11-23 DIAGNOSIS — R1310 Dysphagia, unspecified: Secondary | ICD-10-CM | POA: Insufficient documentation

## 2016-11-23 LAB — CBC WITH DIFFERENTIAL/PLATELET
BASOS PCT: 0 %
Basophils Absolute: 0.1 10*3/uL (ref 0–0.1)
EOS ABS: 0 10*3/uL (ref 0–0.7)
Eosinophils Relative: 0 %
HEMATOCRIT: 34.5 % — AB (ref 35.0–47.0)
Hemoglobin: 11.6 g/dL — ABNORMAL LOW (ref 12.0–16.0)
LYMPHS ABS: 1.3 10*3/uL (ref 1.0–3.6)
Lymphocytes Relative: 10 %
MCH: 28.8 pg (ref 26.0–34.0)
MCHC: 33.6 g/dL (ref 32.0–36.0)
MCV: 85.8 fL (ref 80.0–100.0)
MONO ABS: 0 10*3/uL — AB (ref 0.2–0.9)
MONOS PCT: 0 %
NEUTROS ABS: 12 10*3/uL — AB (ref 1.4–6.5)
Neutrophils Relative %: 90 %
Platelets: 175 10*3/uL (ref 150–440)
RBC: 4.02 MIL/uL (ref 3.80–5.20)
RDW: 12.9 % (ref 11.5–14.5)
WBC: 13.4 10*3/uL — ABNORMAL HIGH (ref 3.6–11.0)

## 2016-11-23 NOTE — Progress Notes (Signed)
Patient reports that she went to the ED last week r/t 10 days without a bowel movement and had to have an enema.  Patient reports taking zofran bid everyday to prevent nausea. Patient reports having increased hot flashes and that she is out of her estradiol patch as her obgyn doesn't want to see her until after her chemo is completed. Patient reports increased abdominal pain yesterday but better today.  Did take her po antibiotics wrong for 3 days took twice the amount as prescribed by error.  Patient wanted you to be aware that she took a gummy candy that was laced with marijuania on Friday for her nausea, but it didn't seem to help or hurt.  Took some medical marijuana yesterday for pain and it helped some.

## 2016-11-29 NOTE — Addendum Note (Signed)
Encounter addended by: Vernie Murders, RT on: 11/29/2016 10:13 AM<BR>    Actions taken: Imaging Exam ended, Charge Capture section accepted

## 2016-12-02 ENCOUNTER — Other Ambulatory Visit: Payer: Self-pay | Admitting: Oncology

## 2016-12-02 ENCOUNTER — Inpatient Hospital Stay: Payer: 59

## 2016-12-02 ENCOUNTER — Encounter: Payer: Self-pay | Admitting: Oncology

## 2016-12-02 ENCOUNTER — Inpatient Hospital Stay (HOSPITAL_BASED_OUTPATIENT_CLINIC_OR_DEPARTMENT_OTHER): Payer: 59 | Admitting: Oncology

## 2016-12-02 VITALS — BP 154/87 | HR 86 | Temp 96.5°F | Resp 18 | Wt 161.6 lb

## 2016-12-02 VITALS — BP 128/76 | HR 76 | Resp 16

## 2016-12-02 DIAGNOSIS — R05 Cough: Secondary | ICD-10-CM

## 2016-12-02 DIAGNOSIS — Z5111 Encounter for antineoplastic chemotherapy: Secondary | ICD-10-CM

## 2016-12-02 DIAGNOSIS — C8338 Diffuse large B-cell lymphoma, lymph nodes of multiple sites: Secondary | ICD-10-CM

## 2016-12-02 DIAGNOSIS — M791 Myalgia: Secondary | ICD-10-CM | POA: Diagnosis not present

## 2016-12-02 DIAGNOSIS — Z803 Family history of malignant neoplasm of breast: Secondary | ICD-10-CM | POA: Diagnosis not present

## 2016-12-02 DIAGNOSIS — K2970 Gastritis, unspecified, without bleeding: Secondary | ICD-10-CM | POA: Diagnosis not present

## 2016-12-02 DIAGNOSIS — C8331 Diffuse large B-cell lymphoma, lymph nodes of head, face, and neck: Secondary | ICD-10-CM

## 2016-12-02 DIAGNOSIS — Z801 Family history of malignant neoplasm of trachea, bronchus and lung: Secondary | ICD-10-CM | POA: Diagnosis not present

## 2016-12-02 DIAGNOSIS — K5900 Constipation, unspecified: Secondary | ICD-10-CM

## 2016-12-02 DIAGNOSIS — B9681 Helicobacter pylori [H. pylori] as the cause of diseases classified elsewhere: Secondary | ICD-10-CM | POA: Diagnosis not present

## 2016-12-02 DIAGNOSIS — M898X9 Other specified disorders of bone, unspecified site: Secondary | ICD-10-CM

## 2016-12-02 DIAGNOSIS — Z79899 Other long term (current) drug therapy: Secondary | ICD-10-CM | POA: Diagnosis not present

## 2016-12-02 LAB — CBC WITH DIFFERENTIAL/PLATELET
BASOS ABS: 0 10*3/uL (ref 0–0.1)
Basophils Relative: 1 %
Eosinophils Absolute: 0 10*3/uL (ref 0–0.7)
Eosinophils Relative: 0 %
HEMATOCRIT: 34 % — AB (ref 35.0–47.0)
Hemoglobin: 11.7 g/dL — ABNORMAL LOW (ref 12.0–16.0)
Lymphocytes Relative: 21 %
Lymphs Abs: 1 10*3/uL (ref 1.0–3.6)
MCH: 29.3 pg (ref 26.0–34.0)
MCHC: 34.4 g/dL (ref 32.0–36.0)
MCV: 85.2 fL (ref 80.0–100.0)
MONO ABS: 0.6 10*3/uL (ref 0.2–0.9)
MONOS PCT: 13 %
NEUTROS ABS: 3.2 10*3/uL (ref 1.4–6.5)
Neutrophils Relative %: 65 %
Platelets: 372 10*3/uL (ref 150–440)
RBC: 3.99 MIL/uL (ref 3.80–5.20)
RDW: 12.9 % (ref 11.5–14.5)
WBC: 4.9 10*3/uL (ref 3.6–11.0)

## 2016-12-02 LAB — COMPREHENSIVE METABOLIC PANEL
ALT: 23 U/L (ref 14–54)
AST: 23 U/L (ref 15–41)
Albumin: 3.9 g/dL (ref 3.5–5.0)
Alkaline Phosphatase: 84 U/L (ref 38–126)
Anion gap: 3 — ABNORMAL LOW (ref 5–15)
BILIRUBIN TOTAL: 0.3 mg/dL (ref 0.3–1.2)
BUN: 13 mg/dL (ref 6–20)
CO2: 27 mmol/L (ref 22–32)
CREATININE: 0.76 mg/dL (ref 0.44–1.00)
Calcium: 9 mg/dL (ref 8.9–10.3)
Chloride: 106 mmol/L (ref 101–111)
GFR calc Af Amer: >60 mL/min (ref >60)
Glucose, Bld: 102 mg/dL — ABNORMAL HIGH (ref 65–99)
Potassium: 4.1 mmol/L (ref 3.5–5.1)
Sodium: 136 mmol/L (ref 135–145)
Total Protein: 7.2 g/dL (ref 6.5–8.1)

## 2016-12-02 MED ORDER — PREDNISONE 50 MG PO TABS: 100.0000 mg | ORAL_TABLET | Freq: Every day | ORAL | 3 refills | Status: DC

## 2016-12-02 MED ORDER — SODIUM CHLORIDE 0.9% FLUSH
10.0000 mL | INTRAVENOUS | Status: DC | PRN
  Filled 2016-12-02: qty 10

## 2016-12-02 MED ORDER — ACETAMINOPHEN 325 MG PO TABS
650.0000 mg | ORAL_TABLET | Freq: Once | ORAL | Status: AC
  Administered 2016-12-02: 650 mg via ORAL
  Filled 2016-12-02: qty 2

## 2016-12-02 MED ORDER — SODIUM CHLORIDE 0.9 % IV SOLN: 10.0000 mg | Freq: Once | INTRAVENOUS | Status: DC

## 2016-12-02 MED ORDER — DOXORUBICIN HCL CHEMO IV INJECTION 2 MG/ML
50.0000 mg/m2 | Freq: Once | INTRAVENOUS | Status: AC
  Administered 2016-12-02: 94 mg via INTRAVENOUS
  Filled 2016-12-02: qty 47

## 2016-12-02 MED ORDER — HEPARIN SOD (PORK) LOCK FLUSH 100 UNIT/ML IV SOLN
500.0000 [IU] | Freq: Once | INTRAVENOUS | Status: AC | PRN
  Administered 2016-12-02: 500 [IU]
  Filled 2016-12-02: qty 5

## 2016-12-02 MED ORDER — VINCRISTINE SULFATE CHEMO INJECTION 1 MG/ML
2.0000 mg | Freq: Once | INTRAVENOUS | Status: AC
  Administered 2016-12-02: 2 mg via INTRAVENOUS
  Filled 2016-12-02: qty 2

## 2016-12-02 MED ORDER — SODIUM CHLORIDE 0.9 % IV SOLN
Freq: Once | INTRAVENOUS | Status: AC
  Administered 2016-12-02: 10:00:00 via INTRAVENOUS
  Filled 2016-12-02: qty 1000

## 2016-12-02 MED ORDER — DIPHENHYDRAMINE HCL 25 MG PO CAPS
50.0000 mg | ORAL_CAPSULE | Freq: Once | ORAL | Status: AC
  Administered 2016-12-02: 50 mg via ORAL
  Filled 2016-12-02: qty 2

## 2016-12-02 MED ORDER — SODIUM CHLORIDE 0.9 % IV SOLN
375.0000 mg/m2 | Freq: Once | INTRAVENOUS | Status: AC
  Administered 2016-12-02: 700 mg via INTRAVENOUS
  Filled 2016-12-02: qty 50

## 2016-12-02 MED ORDER — SODIUM CHLORIDE 0.9 % IV SOLN
750.0000 mg/m2 | Freq: Once | INTRAVENOUS | Status: AC
  Administered 2016-12-02: 1400 mg via INTRAVENOUS
  Filled 2016-12-02: qty 50

## 2016-12-02 MED ORDER — DEXAMETHASONE SODIUM PHOSPHATE 10 MG/ML IJ SOLN
10.0000 mg | Freq: Once | INTRAMUSCULAR | Status: AC
  Administered 2016-12-02: 10 mg via INTRAVENOUS
  Filled 2016-12-02: qty 1

## 2016-12-02 MED ORDER — PEGFILGRASTIM 6 MG/0.6ML ~~LOC~~ PSKT
6.0000 mg | PREFILLED_SYRINGE | Freq: Once | SUBCUTANEOUS | Status: AC
  Administered 2016-12-02: 6 mg via SUBCUTANEOUS
  Filled 2016-12-02: qty 0.6

## 2016-12-02 MED ORDER — PALONOSETRON HCL INJECTION 0.25 MG/5ML
0.2500 mg | Freq: Once | INTRAVENOUS | Status: AC
  Administered 2016-12-02: 0.25 mg via INTRAVENOUS
  Filled 2016-12-02: qty 5

## 2016-12-02 NOTE — Progress Notes (Signed)
Here for follow up .Doing well except diff sleeping and on going constipation concerns

## 2016-12-02 NOTE — Addendum Note (Signed)
Addended by: Luella Cook on: 12/02/2016 01:51 PM   Modules accepted: Orders

## 2016-12-02 NOTE — Addendum Note (Signed)
Addended by: Luella Cook on: 12/02/2016 12:46 PM   Modules accepted: Orders

## 2016-12-02 NOTE — Progress Notes (Signed)
Nutrition Assessment   Reason for Assessment:   MD referral for nutrition related questions  ASSESSMENT:  56 year old female with B-cell lymphoma currently on chemotherapy.  Past medical history reviewed  Patient reports that she is eating well.  Typically has breakfast sometimes biscuit with jelly then am snack fruit or 1/2 peanut butter sandwich. For lunch will have grilled chicken with veggies and pm snack of nuts and dried fruit or yogurt. For dinner typically has meat and veggies.  Reports no real desire or taste for meat at this time but still tries to it some.    Reports problems with extreme constipation. Noted ER visit for enema to get bowels moving.    Nutrition Focused Physical Exam: deferred  Medications: lactulose, compazine, ativan, prilosec  Labs: glucose 101  Anthropometrics:   Height: 66 inches Weight: 160 lb UBW: 160s, reports stable weight during treatment BMI: 25   Estimated Energy Needs  Kcals: 1825-2200 calories/d Protein: 88-110 g/d Fluid: 2.2 L/d  NUTRITION DIAGNOSIS: Food and nutrition related knowledge deficit related to cancer and cancer treatment as evidenced by no prior knowledge of nutrition recommendations   MALNUTRITION DIAGNOSIS: none at this time   INTERVENTION:   Discussed overall well-balanced nutrition, encouraging good sources of protein (ie plant based and animal sources).  Discussed ways to manage constipation.  Fact sheet provided. Teach back used Discussed main goal during treatment is to manage treatment side effects to prevent nutrition from decreasing and prevent weight loss.  Patient verbalized understanding.     MONITORING, EVALUATION, GOAL: Patient will consume adequate calories and protein to maintain weight  NEXT VISIT: April 5   Tremon Sainvil B. Zenia Resides, [place], [place] Registered Dietitian [phone] (pager)

## 2016-12-02 NOTE — Progress Notes (Signed)
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 However final B cell clonality awaited  Chief complaint/ Reason for visit- on treatment assessment prior to cycle # 2 of chemotherapy  Heme/Onc history: She does report Is having medicalpatient is a 56 year old female who presented to her primary care doctor with symptoms of right neck swelling in January 2018. Her symptoms of an ongoing since December 2017. Patient works at a [employer] (medial in the swelling but the swelling did not go away. She was then referred to ENT.   2. CT of the soft tissue neck with contrast showed malignant right leg lymphadenopathy with heterogeneous enhancement and extracapsular extension occupying both the right level II and level III nodal stations. Individually B abnormal lymph nodes measure up to 4.2 cm in largest dimension and the conglomerulationof abnormal lymph nodes is 5-5.5 cm across. There is a small but asymmetric and conspicuity 7 mm lymph node along the lower right 3-Bstation. Mass effect on the right carotid space from the abnormal nodes but the major vascular structures in the neck at the skull base including the right IJ remained patent.  3. 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 Cells were CD20 positive and BCL 6 and partially dim BCL-2 positive. B cells were negative for C myc (10%). Mom one was positive in 50% of the cells. Ki-67 was 50%.Bone marrow biopsy was negative for lymphoma involvement  4. Patient is otherwise doing well and denies any symptoms of fevers, chills, unintentional weight loss or drenching night sweats.  She does not have any significant comorbidities. Currently reports dry non productive cough for last 1 week  5. PET/CT showed IMPRESSION: 1. FDG avid malignancy is identified in the level 2 and 3 nodal stations of the right cervical lymph node chain, consistent with the patient's known lymphoma. 2. Focal uptake in the gastric cardia is suspicious for malignancy as well. Recommend direct visualization and biopsy as clinically Warranted.  6. EGD showed diffuse moderately erythematous mucosa without bleeding initial nonbleeding gastric ulcer 6 mm which was biopsied. Findings were consistent with H. pylori gastritis and patient was started on antibiotic therapy for the same   7. EUS also showed H pylori. There were few B cells noted in lamina propria and clonality studies have been ordered. Initial testing for B cell clonality showed no monoclonal B-cell population detected by Randall Hiss heavy chain PCR. Confirmation testing is pending   Interval history- doing well. Does have mild insomnia. Reports hot flashes.   ECOG PS- 0 Pain scale- 0 Opioid associated constipation- mo  Review of systems- Review of Systems  Constitutional: Negative for chills, fever, malaise/fatigue and weight loss.  HENT: Negative for congestion, ear discharge and nosebleeds.   Eyes: Negative for blurred vision.  Respiratory: Negative for cough, hemoptysis, sputum production, shortness of breath and wheezing.   Cardiovascular: Negative for chest pain, palpitations, orthopnea and claudication.  Gastrointestinal: Negative for abdominal pain, blood in stool, constipation, diarrhea, heartburn, melena, nausea and vomiting.  Genitourinary: Negative for dysuria, flank pain, frequency, hematuria and urgency.  Musculoskeletal: Negative for back pain, joint pain and myalgias.  Skin: Negative for rash.  Neurological: Negative for dizziness, tingling,  focal weakness, seizures, weakness and headaches.  Endo/Heme/Allergies: Does not  bruise/bleed easily.  Psychiatric/Behavioral: Negative for depression and suicidal ideas. The patient has insomnia.      Current treatment- RCHOP  No Known Allergies   Past Medical History:  Diagnosis Date  . Cancer ([place])    lymphoma, non hodgekins B cell     Past Surgical History:  Procedure Laterality Date  . ABDOMINAL HYSTERECTOMY    . CESAREAN SECTION     x 2   . ESOPHAGOGASTRODUODENOSCOPY (EGD) WITH PROPOFOL N/A 11/11/2016   Procedure: ESOPHAGOGASTRODUODENOSCOPY (EGD) WITH PROPOFOL;  Surgeon: Jonathon Bellows, MD;  Location: [facility] ENDOSCOPY;  Service: Endoscopy;  Laterality: N/A;  . IR GENERIC HISTORICAL  11/03/2016   IR FLUORO GUIDE PORT INSERTION RIGHT 11/03/2016 Aletta Edouard, MD [facility]-INTERV RAD  . REFRACTIVE SURGERY  09/2015    Social History   Social History  . Marital status: Single    Spouse name: N/A  . Number of children: N/A  . Years of education: N/A   Occupational History  . Not on file.   Social History Main Topics  . Smoking status: Never Smoker  . Smokeless tobacco: Never Used  . Alcohol use No  . Drug use: No  . Sexual activity: Yes   Other Topics Concern  . Not on file   Social History Narrative  . No narrative on file    Family History  Problem Relation Age of Onset  . Cancer Mother     breast  . Cancer Father     lung  . Heart disease Brother 35    MI  . Heart disease Paternal Grandfather     MI     Current Outpatient Prescriptions:  .  allopurinol (ZYLOPRIM) 300 MG tablet, Take 1 tablet (300 mg total) by mouth daily., Disp: 30 tablet, Rfl: 1 .  amoxicillin (AMOXIL) 500 MG capsule, Take 2 capsules (1,000 mg total) by mouth 2 (two) times daily., Disp: 56 capsule, Rfl: 0 .  clarithromycin (BIAXIN) 500 MG tablet, Take 1 tablet (500 mg total) by mouth 2 (two) times daily., Disp: 28 tablet, Rfl: 0 .  estradiol (CLIMARA - DOSED IN MG/24 HR) 0.05 mg/24hr patch, once a week., Disp: , Rfl:  .  FLONASE ALLERGY RELIEF 50 MCG/ACT nasal spray,  , Disp: , Rfl:  .  Lactulose 20 GM/30ML SOLN, Take 30 mLs by mouth every 8 (eight) hours., Disp: , Rfl:  .  lidocaine-prilocaine (EMLA) cream, Apply cream 1 hour before chemotherapy treatment, place a small piece of saran wrap over cream to protect clothing, Disp: 30 g, Rfl: 1 .  LORazepam (ATIVAN) 0.5 MG tablet, Take 1 tablet (0.5 mg total) by mouth every 6 (six) hours as needed (nausea/vomiting)., Disp: 30 tablet, Rfl: 0 .  omeprazole (PRILOSEC) 20 MG capsule, Take 1 capsule (20 mg total) by mouth 2 (two) times daily before a meal., Disp: 28 capsule, Rfl: 0 .  ondansetron (ZOFRAN) 8 MG tablet, Take 1 tablet (8 mg total) by mouth 2 (two) times daily as needed for refractory nausea / vomiting. Starting on day 3 after each cyclophosphamide chemotherapy treatment, Disp: 30 tablet, Rfl: 1 .  predniSONE (DELTASONE) 50 MG tablet, Take 2 tablets (100 mg total) by mouth daily with breakfast. For 5 days with each treatment of chemotherapy, Disp: 10 tablet, Rfl: 3 .  prochlorperazine (COMPAZINE) 10 MG tablet, Take 1 tablet (10 mg total) by mouth every 6 (six) hours as needed for nausea or vomiting., Disp: 30 tablet, Rfl:  3  Physical exam:  Vitals:   12/02/16 0921  BP: (!) 154/87  Pulse: 86  Resp: 18  Temp: (!) 96.5 F (35.8 C)  TempSrc: Tympanic  Weight: 161 lb 9.6 oz (73.3 kg)   Physical Exam  Constitutional: She is oriented to person, place, and time and well-developed, well-nourished, and in no distress.  HENT:  Head: Normocephalic and atraumatic.  Eyes: EOM are normal. Pupils are equal, round, and reactive to light.  Neck: Normal range of motion.  Cardiovascular: Normal rate, regular rhythm and normal heart sounds.   Pulmonary/Chest: Effort normal and breath sounds normal.  Abdominal: Soft. Bowel sounds are normal.  Lymphadenopathy:  No palpable right sided cervical adenopathy  Neurological: She is alert and oriented to person, place, and time.  Skin: Skin is warm and dry.     CMP  Latest Ref Rng & Units 11/21/2016  Glucose 65 - 99 mg/dL 101(H)  BUN 6 - 20 mg/dL 7  Creatinine 0.44 - 1.00 mg/dL 0.87  Sodium 135 - 145 mmol/L 138  Potassium 3.5 - 5.1 mmol/L 4.4  Chloride 101 - 111 mmol/L 106  CO2 22 - 32 mmol/L 24  Calcium 8.9 - 10.3 mg/dL 9.1  Total Protein 6.5 - 8.1 g/dL 7.1  Total Bilirubin 0.3 - 1.2 mg/dL 0.4  Alkaline Phos 38 - 126 U/L 101  AST 15 - 41 U/L 31  ALT 14 - 54 U/L 29   CBC Latest Ref Rng & Units 12/02/2016  WBC 3.6 - 11.0 K/uL 4.9  Hemoglobin 12.0 - 16.0 g/dL 11.7(L)  Hematocrit 35.0 - 47.0 % 34.0(L)  Platelets 150 - 440 K/uL 372    No images are attached to the encounter.  Nm Pet Image Initial (pi) Skull Base To Thigh  Result Date: 11/08/2016 CLINICAL DATA:  Diffuse large B-cell lymphoma in the neck. Initial staging. Recent port placement. Biopsy of a right neck lymph node October 18, 2016. Bone marrow biopsy in the right pelvis on November 03, 2016. EXAM: NUCLEAR MEDICINE PET SKULL BASE TO THIGH TECHNIQUE: 11.69 mCi F-18 FDG was injected intravenously. Full-ring PET imaging was performed from the skull base to thigh after the radiotracer. CT data was obtained and used for attenuation correction and anatomic localization. FASTING BLOOD GLUCOSE:  Value: 94 mg/dl COMPARISON:  CT of the neck October 01, 2016 FINDINGS: NECK Again noted are abnormal lymph nodes in the level 2 and 3 right cervical nodal stations, involving a and b sub sets at these levels. The lymph nodes were better measure on the recent contrast-enhanced CT scan. The index node on the right on series 3, image 25 correlates with the 2.2 cm node on the recent CT scan and demonstrates a maximum SUV of 18.6. The 7 mm node along the lowest right level IIIb station on the recent CT scan is not FDG avid. No FDG avid disease is seen at levels 1, 4, 5, 6, or 7. CHEST No hypermetabolic mediastinal or hilar nodes. No suspicious pulmonary nodules on the CT scan. ABDOMEN/PELVIS There is focal uptake  centered in the gastric cardia with a maximum SUV of 8.5. No abnormal uptake in the spleen. No FDG-avid adenopathy seen within the abdomen or pelvis. No other FDG avid disease in the abdomen or pelvis. SKELETON No focal hypermetabolic activity to suggest skeletal metastasis. IMPRESSION: 1. FDG avid malignancy is identified in the level 2 and 3 nodal stations of the right cervical lymph node chain, consistent with the patient's known lymphoma. 2. Focal uptake in  the gastric cardia is suspicious for malignancy as well. Recommend direct visualization and biopsy as clinically warranted. Electronically Signed   By: Dorise Bullion III M.D   On: 11/08/2016 13:47   Dg Abd 2 Views  Result Date: 11/21/2016 CLINICAL DATA:  No bowel movement for 10 days.  Chemotherapy. EXAM: ABDOMEN - 2 VIEW COMPARISON:  None. FINDINGS: The distal end of a right central line terminates at the caval atrial junction. Minimal opacity in left lung base may represent atelectasis. No free air, portal venous gas, or pneumatosis. Fecal loading from the mid transverse colon through the rectum. No evidence of bowel obstruction. Phleboliths in the pelvis. IMPRESSION: Fecal loading from the mid transverse colon through the rectum. No other acute abnormalities. Electronically Signed   By: Dorise Bullion III M.D   On: 11/21/2016 12:53   Ct Bone Marrow Biopsy & Aspiration  Result Date: 11/03/2016 CLINICAL DATA:  Diffuse large B-cell lymphoma of neck. The patient requires bone marrow biopsy. EXAM: CT GUIDED BONE MARROW ASPIRATION AND BIOPSY ANESTHESIA/SEDATION: Versed 2.0 mg IV, Fentanyl 100 mcg IV Total Moderate Sedation Time:  16 minutes. The patient's level of consciousness and physiologic status were continuously monitored during the procedure by Radiology nursing. PROCEDURE: The procedure risks, benefits, and alternatives were explained to the patient. Questions regarding the procedure were encouraged and answered. The patient understands and  consents to the procedure. A time out was performed prior to initiating the procedure. The right gluteal region was prepped with chlorhexidine. Sterile gown and sterile gloves were used for the procedure. Local anesthesia was provided with 1% Lidocaine. Under CT guidance, an 11 gauge On Control bone cutting needle was advanced from a posterior approach into the right iliac bone. Needle positioning was confirmed with CT. Initial non heparinized and heparinized aspirate samples were obtained of bone marrow. Core biopsy was performed via the On Control drill needle. COMPLICATIONS: None FINDINGS: Inspection of initial aspirate did reveal visible particles. Intact core biopsy sample was obtained. IMPRESSION: CT guided bone marrow biopsy of right posterior iliac bone with both aspirate and core samples obtained. Electronically Signed   By: Aletta Edouard M.D.   On: 11/03/2016 15:41   Ir Fluoro Guide Port Insertion Right  Result Date: 11/03/2016 CLINICAL DATA:  Diffuse large B-cell lymphoma. The patient requires a porta cath to begin chemotherapy. EXAM: IMPLANTED PORT A CATH PLACEMENT WITH ULTRASOUND AND FLUOROSCOPIC GUIDANCE ANESTHESIA/SEDATION: 1.0 mg IV Versed; 50 mcg IV Fentanyl Total Moderate Sedation Time:  52 minutes The patient's level of consciousness and physiologic status were continuously monitored during the procedure by Radiology nursing. Additional Medications: 2 g IV Ancef. As antibiotic prophylaxis, Ancef was ordered pre-procedure and administered intravenously within one hour of incision. FLUOROSCOPY TIME:  36 seconds.  5.0 mGy. PROCEDURE: The procedure, risks, benefits, and alternatives were explained to the patient. Questions regarding the procedure were encouraged and answered. The patient understands and consents to the procedure. A time-out was performed prior to initiating the procedure. Ultrasound was utilized to confirm patency of the right internal jugular vein. The right neck and chest were  prepped with chlorhexidine in a sterile fashion, and a sterile drape was applied covering the operative field. Maximum barrier sterile technique with sterile gowns and gloves were used for the procedure. Local anesthesia was provided with 1% lidocaine. After creating a small venotomy incision, a 21 gauge needle was advanced into the right internal jugular vein under direct, real-time ultrasound guidance. Ultrasound image documentation was performed. After securing guidewire access,  an 8 Fr dilator was placed. A J-wire was kinked to measure appropriate catheter length. A subcutaneous port pocket was then created along the upper chest wall utilizing sharp and blunt dissection. Portable cautery was utilized. The pocket was irrigated with sterile saline. A single lumen power injectable port was chosen for placement. The 8 Fr catheter was tunneled from the port pocket site to the venotomy incision. The port was placed in the pocket. External catheter was trimmed to appropriate length based on guidewire measurement. At the venotomy, an 8 Fr peel-away sheath was placed over a guidewire. The catheter was then placed through the sheath and the sheath removed. Final catheter positioning was confirmed and documented with a fluoroscopic spot image. The port was accessed with a needle and aspirated and flushed with heparinized saline. The access needle was removed. The venotomy and port pocket incisions were closed with subcutaneous 4-0 Monocryl and subcuticular 4-0 Monocryl. Dermabond was applied to both incisions. COMPLICATIONS: COMPLICATIONS None FINDINGS: After catheter placement, the tip lies at the cavo-atrial junction. The catheter aspirates normally and is ready for immediate use. IMPRESSION: Placement of single lumen port a cath via right internal jugular vein. The catheter tip lies at the cavo-atrial junction. A power injectable port a cath was placed and is ready for immediate use. Electronically Signed   By: Aletta Edouard M.D.   On: 11/03/2016 15:42     Assessment and plan- Patient is a 56 y.o. female with stage I DLBCL germinal center type involving righ sided cervical LN  Counts are ok to proceed with cycle # 2 of chemotherapy. I will see her back in 3 weeks prior to cycle # 3 of chemotherapy. We will get PET/CT scan 2 weeks after cycle # 3. If she is NED, I will refer her to rad Onc. Awaiting B cell clonality studies on EUS biopsy. Initial testing for B cell clonality showed no monoclonal B-cell population detected by Randall Hiss heavy chain PCR. Confirmation testing is pending   There remains ambiguity as the site of EUS biopsy may not have correlated with PET positivity. If B cell clonality is negative, I am inclined to give her 3 cycles of chemotherapy followed by involved field radiation. However given the ambiguity between the site of PET positivity and the EUS guided biopsy, I will refer her to [facility] for second opinion to see if she really needs 6 cycles of chemotherapy.If B cells are positive for clonality, this would be suggestive of gastric involvement making it stage IV DLBCL and she will need 6 cycles of RCHOP in that case.   Total face to face encounter time for this patient visit was 30 min. >50% of the time was  spent in counseling and coordination of care.     Visit Diagnosis 1. 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 Encounter for antineoplastic chemotherapy      Dr. Randa Evens, MD, MPH [facility] Pager- [phone] 12/02/2016 9:57 AM

## 2016-12-06 ENCOUNTER — Telehealth: Payer: Self-pay | Admitting: *Deleted

## 2016-12-06 DIAGNOSIS — C8331 Diffuse large B-cell lymphoma, lymph nodes of head, face, and neck: Secondary | ICD-10-CM

## 2016-12-06 MED ORDER — MAGIC MOUTHWASH W/LIDOCAINE: 5.0000 mL | Freq: Four times a day (QID) | ORAL | 1 refills | Status: DC | PRN

## 2016-12-06 NOTE — Telephone Encounter (Signed)
Pt states that she has blisters in her mouth, throat. She has been using biotene and it is causing her not to eat.  I checked with Dr. Grayland Ormond and he said to give mmw. I have sent it in to pharmacy and I told pt that I would call it into her pharmacy. Called the pharmacy to call in medicine

## 2016-12-07 ENCOUNTER — Telehealth: Payer: Self-pay | Admitting: Gastroenterology

## 2016-12-07 ENCOUNTER — Telehealth: Payer: Self-pay | Admitting: *Deleted

## 2016-12-07 ENCOUNTER — Other Ambulatory Visit: Payer: Self-pay

## 2016-12-07 MED ORDER — OMEPRAZOLE 20 MG PO CPDR: 20.0000 mg | DELAYED_RELEASE_CAPSULE | Freq: Two times a day (BID) | ORAL | 3 refills | Status: DC

## 2016-12-07 MED ORDER — SUCRALFATE 1 G PO TABS: 1.0000 g | ORAL_TABLET | Freq: Three times a day (TID) | ORAL | 0 refills | Status: DC

## 2016-12-07 NOTE — Telephone Encounter (Signed)
   amoxicillin (AMOXIL) 500 MG capsule, clarithromycin (BIAXIN) 500 MG tablet and omeprazole (PRILOSEC) 20 MG capsule. Please call patient as she needs instructions on all her meds. She has been taking them improperly (doubled her amount).

## 2016-12-07 NOTE — Telephone Encounter (Signed)
Pt called this am and said that the medicine did not help much. She wanted to see if she could get carafate and I checked with corcoran on call and she approved it. I sent it in and pt aware it is going to pharmacy

## 2016-12-08 ENCOUNTER — Inpatient Hospital Stay: Payer: 59

## 2016-12-08 ENCOUNTER — Other Ambulatory Visit: Payer: Self-pay

## 2016-12-08 ENCOUNTER — Emergency Department: Payer: Managed Care, Other (non HMO)

## 2016-12-08 ENCOUNTER — Observation Stay
Admission: EM | Admit: 2016-12-08 | Discharge: 2016-12-10 | Disposition: A | Discharge status: Home / Self Care | Payer: Managed Care, Other (non HMO) | Attending: Internal Medicine | Admitting: Internal Medicine

## 2016-12-08 ENCOUNTER — Encounter: Payer: Self-pay | Admitting: Medical Oncology

## 2016-12-08 ENCOUNTER — Inpatient Hospital Stay (HOSPITAL_BASED_OUTPATIENT_CLINIC_OR_DEPARTMENT_OTHER): Payer: 59 | Admitting: Internal Medicine

## 2016-12-08 ENCOUNTER — Telehealth: Payer: Self-pay | Admitting: *Deleted

## 2016-12-08 VITALS — BP 114/76 | HR 73 | Temp 97.6°F | Resp 18

## 2016-12-08 DIAGNOSIS — K219 Gastro-esophageal reflux disease without esophagitis: Secondary | ICD-10-CM | POA: Insufficient documentation

## 2016-12-08 DIAGNOSIS — E860 Dehydration: Secondary | ICD-10-CM

## 2016-12-08 DIAGNOSIS — B9681 Helicobacter pylori [H. pylori] as the cause of diseases classified elsewhere: Secondary | ICD-10-CM | POA: Diagnosis not present

## 2016-12-08 DIAGNOSIS — M542 Cervicalgia: Secondary | ICD-10-CM

## 2016-12-08 DIAGNOSIS — K2970 Gastritis, unspecified, without bleeding: Secondary | ICD-10-CM

## 2016-12-08 DIAGNOSIS — R05 Cough: Secondary | ICD-10-CM | POA: Diagnosis not present

## 2016-12-08 DIAGNOSIS — Z803 Family history of malignant neoplasm of breast: Secondary | ICD-10-CM

## 2016-12-08 DIAGNOSIS — R748 Abnormal levels of other serum enzymes: Secondary | ICD-10-CM | POA: Diagnosis present

## 2016-12-08 DIAGNOSIS — M898X9 Other specified disorders of bone, unspecified site: Secondary | ICD-10-CM | POA: Diagnosis not present

## 2016-12-08 DIAGNOSIS — E876 Hypokalemia: Secondary | ICD-10-CM | POA: Diagnosis not present

## 2016-12-08 DIAGNOSIS — R071 Chest pain on breathing: Secondary | ICD-10-CM

## 2016-12-08 DIAGNOSIS — C8338 Diffuse large B-cell lymphoma, lymph nodes of multiple sites: Secondary | ICD-10-CM | POA: Diagnosis not present

## 2016-12-08 DIAGNOSIS — Z5111 Encounter for antineoplastic chemotherapy: Secondary | ICD-10-CM | POA: Diagnosis not present

## 2016-12-08 DIAGNOSIS — C8331 Diffuse large B-cell lymphoma, lymph nodes of head, face, and neck: Secondary | ICD-10-CM

## 2016-12-08 DIAGNOSIS — Z79899 Other long term (current) drug therapy: Secondary | ICD-10-CM | POA: Insufficient documentation

## 2016-12-08 DIAGNOSIS — R1310 Dysphagia, unspecified: Secondary | ICD-10-CM | POA: Diagnosis not present

## 2016-12-08 DIAGNOSIS — K5900 Constipation, unspecified: Secondary | ICD-10-CM

## 2016-12-08 DIAGNOSIS — R778 Other specified abnormalities of plasma proteins: Secondary | ICD-10-CM

## 2016-12-08 DIAGNOSIS — Z79890 Hormone replacement therapy (postmenopausal): Secondary | ICD-10-CM | POA: Insufficient documentation

## 2016-12-08 DIAGNOSIS — K1230 Oral mucositis (ulcerative), unspecified: Secondary | ICD-10-CM | POA: Diagnosis not present

## 2016-12-08 DIAGNOSIS — R7989 Other specified abnormal findings of blood chemistry: Secondary | ICD-10-CM

## 2016-12-08 DIAGNOSIS — R6883 Chills (without fever): Secondary | ICD-10-CM

## 2016-12-08 DIAGNOSIS — M791 Myalgia: Secondary | ICD-10-CM

## 2016-12-08 DIAGNOSIS — H9203 Otalgia, bilateral: Secondary | ICD-10-CM

## 2016-12-08 DIAGNOSIS — K2900 Acute gastritis without bleeding: Secondary | ICD-10-CM | POA: Insufficient documentation

## 2016-12-08 DIAGNOSIS — R079 Chest pain, unspecified: Secondary | ICD-10-CM | POA: Insufficient documentation

## 2016-12-08 DIAGNOSIS — R0602 Shortness of breath: Secondary | ICD-10-CM

## 2016-12-08 DIAGNOSIS — Z801 Family history of malignant neoplasm of trachea, bronchus and lung: Secondary | ICD-10-CM

## 2016-12-08 DIAGNOSIS — D61810 Antineoplastic chemotherapy induced pancytopenia: Secondary | ICD-10-CM | POA: Insufficient documentation

## 2016-12-08 DIAGNOSIS — Z7689 Persons encountering health services in other specified circumstances: Secondary | ICD-10-CM

## 2016-12-08 HISTORY — DX: Gastro-esophageal reflux disease without esophagitis: K21.9

## 2016-12-08 HISTORY — DX: Chest pain, unspecified: R07.9

## 2016-12-08 HISTORY — DX: Unspecified B-cell lymphoma, unspecified site: C85.10

## 2016-12-08 HISTORY — DX: Dehydration: E86.0

## 2016-12-08 LAB — CBC WITH DIFFERENTIAL/PLATELET
BASOS ABS: 0 10*3/uL (ref 0–0.1)
Basophils Relative: 0 %
EOS PCT: 1 %
Eosinophils Absolute: 0 10*3/uL (ref 0–0.7)
HEMATOCRIT: 30.6 % — AB (ref 35.0–47.0)
Hemoglobin: 10.4 g/dL — ABNORMAL LOW (ref 12.0–16.0)
LYMPHS PCT: 32 %
Lymphs Abs: 0.7 10*3/uL — ABNORMAL LOW (ref 1.0–3.6)
MCH: 29.5 pg (ref 26.0–34.0)
MCHC: 34 g/dL (ref 32.0–36.0)
MCV: 86.8 fL (ref 80.0–100.0)
MONOS PCT: 3 %
Monocytes Absolute: 0.1 10*3/uL — ABNORMAL LOW (ref 0.2–0.9)
NEUTROS PCT: 64 %
Neutro Abs: 1.3 10*3/uL — ABNORMAL LOW (ref 1.4–6.5)
Platelets: 110 10*3/uL — ABNORMAL LOW (ref 150–440)
RBC: 3.53 MIL/uL — AB (ref 3.80–5.20)
RDW: 13.8 % (ref 11.5–14.5)
WBC: 2.1 10*3/uL — AB (ref 3.6–11.0)

## 2016-12-08 LAB — TROPONIN I
Troponin I: 0.06 ng/mL (ref <0.03)
Troponin I: <0.03 ng/mL (ref <0.03)

## 2016-12-08 LAB — COMPREHENSIVE METABOLIC PANEL
ALBUMIN: 3.3 g/dL — AB (ref 3.5–5.0)
ALT: 14 U/L (ref 14–54)
AST: 20 U/L (ref 15–41)
Alkaline Phosphatase: 81 U/L (ref 38–126)
Anion gap: 7 (ref 5–15)
BILIRUBIN TOTAL: 0.3 mg/dL (ref 0.3–1.2)
BUN: 17 mg/dL (ref 6–20)
CHLORIDE: 104 mmol/L (ref 101–111)
CO2: 25 mmol/L (ref 22–32)
CREATININE: 0.69 mg/dL (ref 0.44–1.00)
Calcium: 8.7 mg/dL — ABNORMAL LOW (ref 8.9–10.3)
GFR calc Af Amer: >60 mL/min (ref >60)
GLUCOSE: 123 mg/dL — AB (ref 65–99)
POTASSIUM: 3 mmol/L — AB (ref 3.5–5.1)
Sodium: 136 mmol/L (ref 135–145)
Total Protein: 6.1 g/dL — ABNORMAL LOW (ref 6.5–8.1)

## 2016-12-08 LAB — LACTATE DEHYDROGENASE: LDH: 128 U/L (ref 98–192)

## 2016-12-08 LAB — MAGNESIUM: Magnesium: 1.8 mg/dL (ref 1.7–2.4)

## 2016-12-08 LAB — POCT RAPID STREP A: Streptococcus, Group A Screen (Direct): NEGATIVE

## 2016-12-08 LAB — CKMB (ARMC ONLY): CK, MB: 8 ng/mL — AB (ref 0.5–5.0)

## 2016-12-08 LAB — INFLUENZA PANEL BY PCR (TYPE A & B)
INFLAPCR: NEGATIVE
Influenza B By PCR: NEGATIVE

## 2016-12-08 MED ORDER — NITROGLYCERIN 2 % TD OINT
0.5000 [in_us] | TOPICAL_OINTMENT | Freq: Once | TRANSDERMAL | Status: AC
  Administered 2016-12-08: 0.5 [in_us] via TOPICAL
  Filled 2016-12-08: qty 1

## 2016-12-08 MED ORDER — SODIUM CHLORIDE 0.9% FLUSH
3.0000 mL | Freq: Two times a day (BID) | INTRAVENOUS | Status: DC
  Administered 2016-12-09 – 2016-12-10 (×3): 3 mL via INTRAVENOUS

## 2016-12-08 MED ORDER — POTASSIUM CHLORIDE CRYS ER 20 MEQ PO TBCR
20.0000 meq | EXTENDED_RELEASE_TABLET | Freq: Two times a day (BID) | ORAL | Status: AC
  Administered 2016-12-09: 20 meq via ORAL
  Filled 2016-12-08: qty 1

## 2016-12-08 MED ORDER — ENOXAPARIN SODIUM 40 MG/0.4ML ~~LOC~~ SOLN
40.0000 mg | SUBCUTANEOUS | Status: DC
  Filled 2016-12-08: qty 0.4

## 2016-12-08 MED ORDER — ONDANSETRON HCL 4 MG/2ML IJ SOLN
4.0000 mg | Freq: Four times a day (QID) | INTRAMUSCULAR | Status: DC | PRN
  Administered 2016-12-09 – 2016-12-10 (×4): 4 mg via INTRAVENOUS
  Filled 2016-12-08 (×4): qty 2

## 2016-12-08 MED ORDER — ONDANSETRON HCL 4 MG PO TABS: 4.0000 mg | ORAL_TABLET | Freq: Four times a day (QID) | ORAL | Status: DC | PRN

## 2016-12-08 MED ORDER — LORATADINE 10 MG PO TABS
10.0000 mg | ORAL_TABLET | Freq: Every day | ORAL | Status: DC
  Administered 2016-12-10: 10 mg via ORAL
  Filled 2016-12-08: qty 1

## 2016-12-08 MED ORDER — ACETAMINOPHEN 500 MG PO TABS
1000.0000 mg | ORAL_TABLET | Freq: Once | ORAL | Status: AC
  Administered 2016-12-08: 1000 mg via ORAL
  Filled 2016-12-08: qty 2

## 2016-12-08 MED ORDER — HYDROMORPHONE HCL 1 MG/ML IJ SOLN
0.5000 mg | INTRAMUSCULAR | Status: DC | PRN
  Administered 2016-12-08 – 2016-12-09 (×3): 0.5 mg via INTRAVENOUS
  Filled 2016-12-08 (×2): qty 0.5
  Filled 2016-12-08: qty 1
  Filled 2016-12-08: qty 0.5

## 2016-12-08 MED ORDER — POTASSIUM CHLORIDE CRYS ER 20 MEQ PO TBCR
40.0000 meq | EXTENDED_RELEASE_TABLET | Freq: Once | ORAL | Status: AC
  Administered 2016-12-08: 40 meq via ORAL
  Filled 2016-12-08: qty 2

## 2016-12-08 MED ORDER — NITROGLYCERIN 0.4 MG SL SUBL
0.4000 mg | SUBLINGUAL_TABLET | SUBLINGUAL | Status: DC | PRN
  Administered 2016-12-08: 0.4 mg via SUBLINGUAL
  Filled 2016-12-08 (×2): qty 1

## 2016-12-08 MED ORDER — IOPAMIDOL (ISOVUE-370) INJECTION 76%
75.0000 mL | Freq: Once | INTRAVENOUS | Status: AC | PRN
  Administered 2016-12-08: 75 mL via INTRAVENOUS

## 2016-12-08 MED ORDER — ASPIRIN 81 MG PO CHEW
324.0000 mg | CHEWABLE_TABLET | Freq: Once | ORAL | Status: AC
  Administered 2016-12-08: 324 mg via ORAL
  Filled 2016-12-08: qty 4

## 2016-12-08 MED ORDER — HEPARIN SOD (PORK) LOCK FLUSH 100 UNIT/ML IV SOLN
500.0000 [IU] | Freq: Once | INTRAVENOUS | Status: DC
  Filled 2016-12-08: qty 5

## 2016-12-08 MED ORDER — MAGIC MOUTHWASH W/LIDOCAINE
5.0000 mL | Freq: Four times a day (QID) | ORAL | Status: DC | PRN
  Filled 2016-12-08: qty 5

## 2016-12-08 MED ORDER — SODIUM CHLORIDE 0.9 % IV BOLUS (SEPSIS)
1000.0000 mL | Freq: Once | INTRAVENOUS | Status: AC
  Administered 2016-12-08: 1000 mL via INTRAVENOUS

## 2016-12-08 MED ORDER — ACETAMINOPHEN 650 MG RE SUPP: 650.0000 mg | Freq: Four times a day (QID) | RECTAL | Status: DC | PRN

## 2016-12-08 MED ORDER — LEVOFLOXACIN 500 MG PO TABS: 500.0000 mg | ORAL_TABLET | Freq: Every day | ORAL | 0 refills | Status: DC

## 2016-12-08 MED ORDER — ACETAMINOPHEN 325 MG PO TABS: 650.0000 mg | ORAL_TABLET | Freq: Four times a day (QID) | ORAL | Status: DC | PRN

## 2016-12-08 MED ORDER — SODIUM CHLORIDE 0.9 % IV SOLN
Freq: Once | INTRAVENOUS | Status: AC
  Administered 2016-12-08: 15:00:00 via INTRAVENOUS
  Filled 2016-12-08: qty 1000

## 2016-12-08 MED ORDER — SODIUM CHLORIDE 0.9% FLUSH
10.0000 mL | INTRAVENOUS | Status: DC | PRN
  Administered 2016-12-08: 10 mL via INTRAVENOUS
  Filled 2016-12-08: qty 10

## 2016-12-08 MED ORDER — PANTOPRAZOLE SODIUM 40 MG PO TBEC: 40.0000 mg | DELAYED_RELEASE_TABLET | Freq: Every day | ORAL | Status: DC

## 2016-12-08 MED ORDER — MORPHINE SULFATE 2 MG/ML IJ SOLN
2.0000 mg | Freq: Once | INTRAMUSCULAR | Status: AC
Start: 2016-12-08 — End: 2016-12-08
  Administered 2016-12-08: 2 mg via INTRAVENOUS
  Filled 2016-12-08: qty 1

## 2016-12-08 MED ORDER — DOCUSATE SODIUM 100 MG PO CAPS
100.0000 mg | ORAL_CAPSULE | Freq: Two times a day (BID) | ORAL | Status: DC
  Administered 2016-12-09 – 2016-12-10 (×2): 100 mg via ORAL
  Filled 2016-12-08 (×2): qty 1

## 2016-12-08 NOTE — ED Provider Notes (Signed)
[facility] Emergency Department Provider Note  ____________________________________________  Time seen: Approximately 5:06 PM  I have reviewed the triage vital signs and the nursing notes.   HISTORY  Chief Complaint Abnormal Lab and Chest Pain    HPI Christina [name] is a 56 y.o. 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 The patient reports that this morning she was sitting down at home when she developed a left-sided chest pain "like 500 pounds was on my chest". Over the last couple of days, she has noticed a nonproductive cough with sore throat and congestion,, and myalgias, without fever. She is also had exertional shortness of breath which is new. Her chest pain is not pleuritic, but it feels "like I'm taking deep breaths of cold air." She denies any lower extremity swelling or calf pain.  FH: Father with "massive MI," and brother with heart attack at age 79 my: No family history of blood clots.  SH: Denies smoking or cocaine   Past Medical History:  Diagnosis Date  . Cancer ([place])    lymphoma, non hodgekins B cell    Patient Active Problem List   Diagnosis Date Noted  . Dehydration 12/08/2016  . Chest pain 12/08/2016  . Goals of care, counseling/discussion 11/11/2016  . 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[place]) 11/01/2016    Past Surgical History:  Procedure Laterality Date  . ABDOMINAL HYSTERECTOMY    . CESAREAN SECTION     x 2   . ESOPHAGOGASTRODUODENOSCOPY (EGD) WITH PROPOFOL N/A 11/11/2016   Procedure: ESOPHAGOGASTRODUODENOSCOPY (EGD) WITH PROPOFOL;  Surgeon: Jonathon Bellows, MD;  Location: [facility] ENDOSCOPY;  Service: Endoscopy;  Laterality: N/A;  . IR GENERIC HISTORICAL  11/03/2016   IR FLUORO GUIDE PORT INSERTION RIGHT 11/03/2016 Aletta Edouard, MD [facility]-INTERV RAD  . REFRACTIVE SURGERY  09/2015    Current Outpatient Rx  . Order #:  [id number] Class: Historical Med  . Order #: [id number] Class: Historical Med  . Order #: [id number] Class: Historical Med  . Order #: [id number] Class: Normal  . Order #: [id number] Class: Historical Med  . Order #: [id number] Class: Print  . Order #: [id number] Class: Normal  . Order #: [id number] Class: Normal  . Order #: [id number] Class: Normal  . Order #: [id number] Class: Normal  . Order #: [id number] Class: Normal  . Order #: [id number] Class: Print  . Order #: [id number] Class: Print  . Order #: [id number] Class: Normal  . Order #: [id number] Class: Normal  . Order #: [id number] Class: Print    Allergies Patient has no known allergies.  Family History  Problem Relation Age of Onset  . Cancer Mother     breast  . Cancer Father     lung  . Heart disease Brother 43    MI  . Heart disease Paternal Grandfather     MI    Social History Social History  Substance Use Topics  . Smoking status: Never Smoker  . Smokeless tobacco: Never Used  . Alcohol use No    Review of Systems Constitutional: No fever/chills.No lightheadedness or syncope. Positive general malaise with diffuse myalgias. Eyes: No visual changes. No eye discharge. No diaphoresis. ENT: Positive sore throat. Positive congestion and rhinorrhea. No ear pain. Cardiovascular: Positive chest pain. Denies palpitations. Respiratory: Denies shortness of breath.  Positive cough. Gastrointestinal: No abdominal pain.  No nausea, no vomiting.  No diarrhea.  No constipation. Genitourinary: Negative for dysuria. Musculoskeletal: Negative for back pain. Skin: Negative for rash. Neurological: Negative for  headaches. No focal numbness, tingling or weakness.   10-point ROS otherwise negative.  ____________________________________________   PHYSICAL EXAM:  VITAL SIGNS: ED Triage Vitals  Enc Vitals Group     BP 12/08/16 1547 128/82     Pulse Rate 12/08/16 1547 73     Resp 12/08/16 1547 18     Temp 12/08/16 1547 98.2 F (36.8 C)     Temp  Source 12/08/16 1547 Oral     SpO2 12/08/16 1547 98 %     Weight 12/08/16 1545 161 lb (73 kg)     Height --      Head Circumference --      Peak Flow --      Pain Score 12/08/16 1545 8     Pain Loc --      Pain Edu? --      Excl. in [place]? --     Constitutional: Alert and oriented. Chronically ill appearing but in no acute distress. Answers questions appropriately. Eyes: Conjunctivae are normal.  EOMI. No scleral icterus. No eye discharge. Head: Atraumatic. Nose: No congestion/rhinnorhea on my exam. Mouth/Throat: Mucous membranes are moist. No posterior pharyngeal erythema, tonsillar swelling or exudate. The posterior palate is symmetric and the uvula is midline. Neck: No stridor.  Supple.  No JVD. No meningismus. Cardiovascular: Normal rate, regular rhythm. No murmurs, rubs or gallops. Port placed in the right upper chest without any surrounding erythema, swelling or exudate. Respiratory: Normal respiratory effort.  No accessory muscle use or retractions. Lungs CTAB.  No wheezes, rales or ronchi. Gastrointestinal: Soft, nontender and nondistended.  No guarding or rebound.  No peritoneal signs. Musculoskeletal: No LE edema. Negative Homan's sign. The patient has diffuse leg pain to palpation, but not focal to the calves. No palpable cords. Neurologic:  A&Ox3.  Speech is clear.  Face and smile are symmetric.  EOMI.  Moves all extremities well. Skin:  Skin is warm, dry and intact. No rash noted. Psychiatric: Mood and affect are normal. Speech and behavior are normal.  Normal judgement  ____________________________________________   LABS (all labs ordered are listed, but only abnormal results are displayed)  Labs Reviewed  TROPONIN I  INFLUENZA PANEL BY PCR (TYPE A & B)  URINALYSIS, COMPLETE (UACMP) WITH MICROSCOPIC  POCT RAPID STREP A   ____________________________________________  EKG  ED ECG REPORT I, Eula Listen, the attending physician, personally viewed and  interpreted this ECG.   Date: 12/08/2016  EKG Time: 1543  Rate: 74  Rhythm: normal sinus rhythm  Axis: leftward  Intervals:none  ST&T Change: Specific T-wave inversions in V1. No ST elevation.  ____________________________________________  RADIOLOGY  Ct Angio Chest Pe W And/or Wo Contrast  Result Date: 12/08/2016 CLINICAL DATA:  Chest pain starting this morning, shortness of Breath, history of lymphoma EXAM: CT ANGIOGRAPHY CHEST WITH CONTRAST TECHNIQUE: Multidetector CT imaging of the chest was performed using the standard protocol during bolus administration of intravenous contrast. Multiplanar CT image reconstructions and MIPs were obtained to evaluate the vascular anatomy. A cap on the power port's tubing loosened and so contrast leaked out during bolus. Repeated study due to poor first study. LP CONTRAST:  75 cc Isovue COMPARISON:  PET scan 11/08/2016 FINDINGS: Cardiovascular: Cardiac size within normal limits. No pericardial effusion. No evidence of pulmonary embolus. No aortic aneurysm or aortic dissection. The second study is of excellent technical quality. Mediastinum/Nodes: No mediastinal or hilar adenopathy. Central airways are patent. There is right IJ Port-A-Cath with tip in right atrium. Lungs/Pleura: Images of the  lung parenchyma shows no infiltrate or pulmonary edema. Mild dependent atelectasis noted bilateral lower lobe posteriorly. Mild dependent atelectasis posterior aspect left upper lobe. No pulmonary mass or pulmonary nodules Upper Abdomen: The visualized upper abdomen shows no adrenal gland mass. Visualized spleen and liver is unremarkable. Musculoskeletal: No destructive bony lesions are noted. Sagittal images of the spine shows mild degenerative changes thoracic spine. Review of the MIP images confirms the above findings. IMPRESSION: 1. No pulmonary embolus is noted. 2. No aortic aneurysm or aortic dissection. 3. No mediastinal or hilar adenopathy. 4. No acute infiltrate or  pulmonary edema. Electronically Signed   By: Lahoma Crocker M.D.   On: 12/08/2016 19:00    ____________________________________________   PROCEDURES  Procedure(s) performed: None  Procedures  Critical Care performed: No ____________________________________________   INITIAL IMPRESSION / ASSESSMENT AND PLAN / ED COURSE  Pertinent labs & imaging results that were available during my care of the patient were reviewed by me and considered in my medical decision making (see chart for details).  56 y.o. female sent from clinic for an elevated troponin in the setting of a chest pain episode. Overall, the patient does have a strong family history, and an elevated troponin, so I will treat her with aspirin and nitroglycerin glycerin to see if we can resolve her chest pain. Other potential causes include PE, given that she is at risk for blood clots due to her cancer. We'll get a CT angiogram to evaluate for this. I'm also concerned about the infectious type symptoms she is reporting, and we'll test her for influenza as well as strep pharyngitis. She'll require admission to the hospital for further evaluation and treatment.  ----------------------------------------- 7:25 PM on 12/08/2016 -----------------------------------------  The patient states that a nitroglycerin glycerin did help her pain. She reports that her pain has significantly improved except for when she moves around or when the bed is moved around. Her repeat troponin was less than 0.3, and her CT angiogram did not show PE. However, given that she is having ongoing pain and has significant risk factors without any recent risk stratification studies, I'll plan to admit her for further evaluation and treatment. ____________________________________________  FINAL CLINICAL IMPRESSION(S) / ED DIAGNOSES  Final diagnoses:  Hypokalemia  Elevated troponin  Chest pain, unspecified type         NEW MEDICATIONS STARTED DURING THIS  VISIT:  New Prescriptions   No medications on file      Eula Listen, MD 12/08/16 1926

## 2016-12-08 NOTE — ED Notes (Signed)
Patient transported to CT 

## 2016-12-08 NOTE — ED Notes (Signed)
Pt refuses 2nd nitro, states pain is getting better.

## 2016-12-08 NOTE — Progress Notes (Signed)
Call report to ED. Patient transfer from cancer center infusion suite to ED. Pt reports 'crushing' pressure chest pain - continues to radiate to right shoulder.  Hand off report to [facility], ED Troponin I <0.03 ng/mL 0.06     CK, MB 0.5 - 5.0 ng/mL 8.0     ekg level - normal sinus rhythm.  Sent to ED for further work up

## 2016-12-08 NOTE — H&P (Signed)
[facility] NAME: Christina [name]    MR#:  [id number]  DATE OF BIRTH:  21-Nov-1960  DATE OF ADMISSION:  12/08/2016  PRIMARY CARE PHYSICIAN: [name], NP   REQUESTING/REFERRING PHYSICIAN: Dr. Aundria Rud  CHIEF COMPLAINT:   Chief Complaint  Patient presents with  . Abnormal Lab  . Chest Pain    HISTORY OF PRESENT ILLNESS:  Christina [name]  is a 56 y.o. female with a known history of Large B-cell lymphoma, GERD who presents to the hospital due to chest pain/pressure. Patient says she was in a usual state of health when this morning she woke up with heaviness in the center of her chest radiating to her left arm and back. Patient did have associated shortness of breath with it but no diaphoresis, palpitations or dizziness or syncope. Patient has never had these symptoms before. She denies any significant cardiac history. She does have a significant family history of heart disease. She went for a follow-up at her [facility] in due to her ongoing symptoms she was sent to the ER for further evaluation. At the [facility] due to some blood work which showed a mildly elevated troponin of 0.06, her repeat troponin in the emergency room is down 0.03. She continues to have chest pain and therefore hospitalist services were contacted further treatment and evaluation. Patient denies any other associated symptoms presently. She does say that her chest pain/pressure gets worse with movement and also on deep pressure with palpation.  PAST MEDICAL HISTORY:   Past Medical History:  Diagnosis Date  . B-cell lymphoma ([place])   . Cancer ([facility])    lymphoma, non hodgekins B cell  . GERD (gastroesophageal reflux disease)     PAST SURGICAL HISTORY:   Past Surgical History:  Procedure Laterality Date  . ABDOMINAL HYSTERECTOMY    . CESAREAN SECTION     x 2   . ESOPHAGOGASTRODUODENOSCOPY (EGD) WITH PROPOFOL N/A 11/11/2016   Procedure:  ESOPHAGOGASTRODUODENOSCOPY (EGD) WITH PROPOFOL;  Surgeon: Jonathon Bellows, MD;  Location: [facility] ENDOSCOPY;  Service: Endoscopy;  Laterality: N/A;  . IR GENERIC HISTORICAL  11/03/2016   IR FLUORO GUIDE PORT INSERTION RIGHT 11/03/2016 Aletta Edouard, MD [facility]-INTERV RAD  . REFRACTIVE SURGERY  09/2015    SOCIAL HISTORY:   Social History  Substance Use Topics  . Smoking status: Never Smoker  . Smokeless tobacco: Never Used  . Alcohol use No    FAMILY HISTORY:   Family History  Problem Relation Age of Onset  . Cancer Mother     breast  . Cancer Father     lung  . Heart disease Brother 5    MI  . Heart disease Paternal Grandfather     MI    DRUG ALLERGIES:  No Known Allergies  REVIEW OF SYSTEMS:   Review of Systems  Constitutional: Negative for fever and weight loss.  HENT: Negative for congestion, nosebleeds and tinnitus.   Eyes: Negative for blurred vision, double vision and redness.  Respiratory: Negative for cough, hemoptysis and shortness of breath.   Cardiovascular: Positive for chest pain. Negative for orthopnea, leg swelling and PND.  Gastrointestinal: Negative for abdominal pain, diarrhea, melena, nausea and vomiting.  Genitourinary: Negative for dysuria, hematuria and urgency.  Musculoskeletal: Negative for falls and joint pain.  Neurological: Positive for weakness. Negative for dizziness, tingling, sensory change, focal weakness, seizures and headaches.  Endo/Heme/Allergies: Negative for polydipsia. Does not bruise/bleed easily.  Psychiatric/Behavioral: Negative for depression and  memory loss. The patient is not nervous/anxious.     MEDICATIONS AT HOME:   Prior to Admission medications   Medication Sig Start Date End Date Taking? Authorizing Provider  docusate sodium (COLACE) 100 MG capsule Take by mouth.   Yes Historical Provider, MD  estradiol (CLIMARA - DOSED IN MG/24 HR) 0.05 mg/24hr patch once a week. 08/30/16  Yes Historical Provider, MD  Lactulose 20 GM/30ML  SOLN Take 30 mLs by mouth every 8 (eight) hours.   Yes Historical Provider, MD  lidocaine-prilocaine (EMLA) cream Apply cream 1 hour before chemotherapy treatment, place a small piece of saran wrap over cream to protect clothing 11/07/16  Yes Sindy Guadeloupe, MD  Loratadine 10 MG CAPS Take 10 mg by mouth daily.    Yes Historical Provider, MD  magic mouthwash w/lidocaine SOLN Take 5 mLs by mouth 4 (four) times daily as needed for mouth pain. 12/06/16  Yes Sindy Guadeloupe, MD  omeprazole (PRILOSEC) 20 MG capsule Take 1 capsule (20 mg total) by mouth 2 (two) times daily before a meal. 12/07/16  Yes Lucilla Lame, MD      VITAL SIGNS:  Blood pressure 116/69, pulse 68, temperature 98.2 F (36.8 C), temperature source Oral, resp. rate 18, weight 73 kg (161 lb), SpO2 93 %.  PHYSICAL EXAMINATION:  Physical Exam  GENERAL:  56 y.o.-year-old patient lying in bed in no acute distress.  EYES: Pupils equal, round, reactive to light and accommodation. No scleral icterus. Extraocular muscles intact.  HEENT: Head atraumatic, normocephalic. Oropharynx and nasopharynx clear. No oropharyngeal erythema, moist oral mucosa  NECK:  Supple, no jugular venous distention. No thyroid enlargement, no tenderness.  LUNGS: Normal breath sounds bilaterally, no wheezing, rales, rhonchi. No use of accessory muscles of respiration.  CARDIOVASCULAR: S1, S2 RRR. No murmurs, rubs, gallops, clicks.  ABDOMEN: Soft, nontender, nondistended. Bowel sounds present. No organomegaly or mass.  EXTREMITIES: No pedal edema, cyanosis, or clubbing. + 2 pedal & radial pulses b/l.   NEUROLOGIC: Cranial nerves II through XII are intact. No focal Motor or sensory deficits appreciated b/l PSYCHIATRIC: The patient is alert and oriented x 3.   SKIN: No obvious rash, lesion, or ulcer.   LABORATORY PANEL:   CBC  Recent Labs Lab 12/08/16 1334  WBC 2.1*  HGB 10.4*  HCT 30.6*  PLT 110*    ------------------------------------------------------------------------------------------------------------------  Chemistries   Recent Labs Lab 12/08/16 1334  NA 136  K 3.0*  CL 104  CO2 25  GLUCOSE 123*  BUN 17  CREATININE 0.69  CALCIUM 8.7*  AST 20  ALT 14  ALKPHOS 81  BILITOT 0.3   ------------------------------------------------------------------------------------------------------------------  Cardiac Enzymes  Recent Labs Lab 12/08/16 1638  TROPONINI <0.03   ------------------------------------------------------------------------------------------------------------------  RADIOLOGY:  Ct Angio Chest Pe W And/or Wo Contrast  Result Date: 12/08/2016 CLINICAL DATA:  Chest pain starting this morning, shortness of Breath, history of lymphoma EXAM: CT ANGIOGRAPHY CHEST WITH CONTRAST TECHNIQUE: Multidetector CT imaging of the chest was performed using the standard protocol during bolus administration of intravenous contrast. Multiplanar CT image reconstructions and MIPs were obtained to evaluate the vascular anatomy. A cap on the power port's tubing loosened and so contrast leaked out during bolus. Repeated study due to poor first study. LP CONTRAST:  75 cc Isovue COMPARISON:  PET scan 11/08/2016 FINDINGS: Cardiovascular: Cardiac size within normal limits. No pericardial effusion. No evidence of pulmonary embolus. No aortic aneurysm or aortic dissection. The second study is of excellent technical quality. Mediastinum/Nodes: No mediastinal or hilar  adenopathy. Central airways are patent. There is right IJ Port-A-Cath with tip in right atrium. Lungs/Pleura: Images of the lung parenchyma shows no infiltrate or pulmonary edema. Mild dependent atelectasis noted bilateral lower lobe posteriorly. Mild dependent atelectasis posterior aspect left upper lobe. No pulmonary mass or pulmonary nodules Upper Abdomen: The visualized upper abdomen shows no adrenal gland mass. Visualized spleen  and liver is unremarkable. Musculoskeletal: No destructive bony lesions are noted. Sagittal images of the spine shows mild degenerative changes thoracic spine. Review of the MIP images confirms the above findings. IMPRESSION: 1. No pulmonary embolus is noted. 2. No aortic aneurysm or aortic dissection. 3. No mediastinal or hilar adenopathy. 4. No acute infiltrate or pulmonary edema. Electronically Signed   By: Lahoma Crocker M.D.   On: 12/08/2016 19:00     IMPRESSION AND PLAN:   56 year old female with past medical history of large B-cell lymphoma currently undergoing chemotherapy, GERD who presents to the hospital due to chest pain/pressure.  1. Chest pain/pressure-patient's symptoms are very atypical for angina. Her pain is more worse with movement and also with palpation. - She does have significant family history of heart disease. -We'll observe on telemetry, cycle her cardiac markers, get a nuclear medicine stress test in the morning. -EKG has been reviewed and shows normal sinus rhythm with no acute ST or T-wave changes. Patient did undergo a CT scan of the chest with contrast which showed no evidence of pulmonary embolism or any aortic dissection. - I will place her on some as needed Dilaudid for pain.   2. Hx of large Cell B-lymphoma - will consult Oncology.   3. Pancytopenia - due to recent chemo.  - follow counts.   4. Hypokalemia -will place on oral Potassium supplements.  - repeat level in a.m. Check Mg. Level.    All the records are reviewed and case discussed with ED provider. Management plans discussed with the patient, family and they are in agreement.  CODE STATUS: Full code  TOTAL TIME TAKING CARE OF THIS PATIENT: 45 minutes.    Henreitta Leber M.D on 12/08/2016 at 8:05 PM  Between 7am to 6pm - Pager - [phone]  After 6pm go to [email or web address] - password [email or web address] [email or web address]  Office  [phone]  CC: Primary care physician; [name],  NP

## 2016-12-08 NOTE — Assessment & Plan Note (Addendum)
#   Stage I diffuse large B-cell lymphoma on our [facility] chemotherapy cycle #2 day #6 today. Improvement of the right neck adenopathy.   # Chest pain- atypical appears noncardiac. Likely musculoskeletal/pleuritic Reviewed the recent 2-D echo normal. However we will check EKG; CK troponin. Also check chest x-ray. Recommend morphine 2 mg IV push 1.  # Chills- no rigors; ANC 1.3. Check blood cultures; start Levaquin 500 mg for 7 days.  # Poor by mouth intake/dehydration recommend IV fluids today.  # Body aches- unclear etiology question Claritin. Recommend Tylenol as needed; also advil up to twice a day for pain x 2-3 days. Morphine 2 mg IVP x1 today.   # Recommend holding amoxicillin/clarithromycin/ recent H.pylori infection- HOLD for above acute issues.   # Difficulty swallowing/pain with swallowing- likely mucositis from chemotherapy; no evidence of any thrush. Recommend magic mouthwash salt and baking soda rinses.  # Follow-up based upon above work up.   # 40 minutes face-to-face with the patient discussing the above plan of care; more than 50% of time spent on  counseling and coordination.

## 2016-12-08 NOTE — Progress Notes (Signed)
[name] OFFICE PROGRESS NOTE  Patient Care Team: [name], NP as PCP - General (Nurse Practitioner)  Cancer Staging Diffuse large B-cell lymphoma of lymph nodes of neck [facility]) Staging form: Hodgkin and Non-Hodgkin Lymphoma, AJCC 8th Edition - Clinical stage from 11/23/2016: Stage I (Diffuse large B-cell lymphoma) - Signed by Christina Guadeloupe, MD on 11/23/2016     No history exists.     This is my first interaction with the patient as patient's primary oncologist has been Dr.Rao; pt being seen in absence of Dr.Rao.     INTERVAL HISTORY:  Christina [name] 56 y.o.  female pleasant patient Diffuse large B-cell lymphoma stage I [facility] center type- currently on CHOP  chemotherapy cycle #2 approximately 6 days ago- is currently being evaluated for pain with swallowing/chest pain.  Patient has multiple complaints- complaints of chest pressure radiating to the left shoulder  for the last few days. It gets worse with movement/ taking deep breaths. Also complains of multiple body aches. Chills. No fevers. Complaints of pain with swallowing; not improved with Carafate/ Magic mouthwash. Complains of soreness in the neck.  Complaints of throbbing in the ears. No diarrhea and no constipation. Poor by mouth intake.  REVIEW OF SYSTEMS:  A complete 10 point review of system is done which is negative except mentioned above/history of present illness.   PAST MEDICAL HISTORY :  Past Medical History:  Diagnosis Date  . Cancer ([place])    lymphoma, non hodgekins B cell    PAST SURGICAL HISTORY :   Past Surgical History:  Procedure Laterality Date  . ABDOMINAL HYSTERECTOMY    . CESAREAN SECTION     x 2   . ESOPHAGOGASTRODUODENOSCOPY (EGD) WITH PROPOFOL N/A 11/11/2016   Procedure: ESOPHAGOGASTRODUODENOSCOPY (EGD) WITH PROPOFOL;  Surgeon: Christina Bellows, MD;  Location: [facility] ENDOSCOPY;  Service: Endoscopy;  Laterality: N/A;  . IR GENERIC HISTORICAL  11/03/2016   IR FLUORO GUIDE PORT INSERTION  RIGHT 11/03/2016 Aletta Edouard, MD [facility]-INTERV RAD  . REFRACTIVE SURGERY  09/2015    FAMILY HISTORY :   Family History  Problem Relation Age of Onset  . Cancer Mother     breast  . Cancer Father     lung  . Heart disease Brother 32    MI  . Heart disease Paternal Grandfather     MI    SOCIAL HISTORY:   Social History  Substance Use Topics  . Smoking status: Never Smoker  . Smokeless tobacco: Never Used  . Alcohol use No    ALLERGIES:  has No Known Allergies.  MEDICATIONS:  Current Outpatient Prescriptions  Medication Sig Dispense Refill  . allopurinol (ZYLOPRIM) 300 MG tablet Take 1 tablet (300 mg total) by mouth daily. 30 tablet 1  . docusate sodium (COLACE) 100 MG capsule Take by mouth.    . lidocaine-prilocaine (EMLA) cream Apply cream 1 hour before chemotherapy treatment, place a small piece of saran wrap over cream to protect clothing 30 g 1  . magic mouthwash w/lidocaine SOLN Take 5 mLs by mouth 4 (four) times daily as needed for mouth pain. 360 mL 1  . omeprazole (PRILOSEC) 20 MG capsule Take 1 capsule (20 mg total) by mouth 2 (two) times daily before a meal. 60 capsule 3  . predniSONE (DELTASONE) 50 MG tablet Take 2 tablets (100 mg total) by mouth daily with breakfast. For 5 days with each treatment of chemotherapy RCHOP 10 tablet 3  . amoxicillin (AMOXIL) 500 MG capsule Take 2  capsules (1,000 mg total) by mouth 2 (two) times daily. (Patient not taking: Reported on 12/08/2016) 56 capsule 0  . clarithromycin (BIAXIN) 500 MG tablet Take 1 tablet (500 mg total) by mouth 2 (two) times daily. (Patient not taking: Reported on 12/08/2016) 28 tablet 0  . estradiol (CLIMARA - DOSED IN MG/24 HR) 0.05 mg/24hr patch once a week.    . Lactulose 20 GM/30ML SOLN Take 30 mLs by mouth every 8 (eight) hours.    Marland Kitchen levofloxacin (LEVAQUIN) 500 MG tablet Take 1 tablet (500 mg total) by mouth daily. 7 tablet 0  . Loratadine 10 MG CAPS Take by mouth.    Marland Kitchen LORazepam (ATIVAN) 0.5 MG tablet Take  1 tablet (0.5 mg total) by mouth every 6 (six) hours as needed (nausea/vomiting). (Patient not taking: Reported on 12/08/2016) 30 tablet 0  . ondansetron (ZOFRAN) 8 MG tablet Take 1 tablet (8 mg total) by mouth 2 (two) times daily as needed for refractory nausea / vomiting. Starting on day 3 after each cyclophosphamide chemotherapy treatment (Patient not taking: Reported on 12/08/2016) 30 tablet 1  . prochlorperazine (COMPAZINE) 10 MG tablet Take 1 tablet (10 mg total) by mouth every 6 (six) hours as needed for nausea or vomiting. (Patient not taking: Reported on 12/02/2016) 30 tablet 3  . sucralfate (CARAFATE) 1 g tablet Take 1 tablet (1 g total) by mouth 4 (four) times daily - after meals and at bedtime. Take pill and mix in water 30 ml and then swish and swallow (Patient not taking: Reported on 12/08/2016) 120 tablet 0   No current facility-administered medications for this visit.    Facility-Administered Medications Ordered in Other Visits  Medication Dose Route Frequency Provider Last Rate Last Dose  . 0.9 %  sodium chloride infusion   Intravenous Once Cammie Sickle, MD   Stopped at 12/08/16 1645  . heparin lock flush 100 unit/mL  500 Units Intravenous Once Christina Guadeloupe, MD      . sodium chloride flush (NS) 0.9 % injection 10 mL  10 mL Intravenous PRN Christina Guadeloupe, MD   10 mL at 12/08/16 1350    PHYSICAL EXAMINATION: ECOG PERFORMANCE STATUS: 1 - Symptomatic but completely ambulatory  BP 114/76   Pulse 73   Temp 97.6 F (36.4 C) (Tympanic)   Resp 18   LMP  (LMP Unknown)   There were no vitals filed for this visit.  GENERAL: Well-nourished well-developed; Alert, no distress and comfortable.   Accompanied by family. EYES: no pallor or icterus OROPHARYNX: no thrush or ulceration; good dentition  NECK: supple, no masses felt LYMPH:  no palpable lymphadenopathy in the cervical, axillary or inguinal regions LUNGS: clear to auscultation and  No wheeze or crackles HEART/CVS: regular  rate & rhythm and no murmurs; No lower extremity edema ABDOMEN:abdomen soft, non-tender and normal bowel sounds Musculoskeletal:no cyanosis of digits and no clubbing; tenderness noted on palpation over the left scapula; anteriorly left costochondral area. PSYCH: alert & oriented x 3 with fluent speech NEURO: no focal motor/sensory deficits SKIN:  no rashes or significant lesions  LABORATORY DATA:  I have reviewed the data as listed    Component Value Date/Time   NA 136 12/08/2016 1334   NA 138 02/21/2014 2045   K 3.0 (L) 12/08/2016 1334   K 4.0 02/21/2014 2045   CL 104 12/08/2016 1334   CL 106 02/21/2014 2045   CO2 25 12/08/2016 1334   CO2 27 02/21/2014 2045   GLUCOSE 123 (H) 12/08/2016  1334   GLUCOSE 98 02/21/2014 2045   BUN 17 12/08/2016 1334   BUN 7 02/21/2014 2045   CREATININE 0.69 12/08/2016 1334   CREATININE 0.85 02/21/2014 2045   CALCIUM 8.7 (L) 12/08/2016 1334   CALCIUM 9.2 02/21/2014 2045   PROT 6.1 (L) 12/08/2016 1334   PROT 7.4 02/21/2014 2045   ALBUMIN 3.3 (L) 12/08/2016 1334   ALBUMIN 3.7 02/21/2014 2045   AST 20 12/08/2016 1334   AST 24 02/21/2014 2045   ALT 14 12/08/2016 1334   ALT 16 02/21/2014 2045   ALKPHOS 81 12/08/2016 1334   ALKPHOS 87 02/21/2014 2045   BILITOT 0.3 12/08/2016 1334   BILITOT 0.3 02/21/2014 2045   GFRNONAA >60 12/08/2016 1334   GFRNONAA >60 02/21/2014 2045   GFRAA >60 12/08/2016 1334   GFRAA >60 02/21/2014 2045    No results found for: SPEP, UPEP  Lab Results  Component Value Date   WBC 2.1 (L) 12/08/2016   NEUTROABS 1.3 (L) 12/08/2016   HGB 10.4 (L) 12/08/2016   HCT 30.6 (L) 12/08/2016   MCV 86.8 12/08/2016   PLT 110 (L) 12/08/2016      Chemistry      Component Value Date/Time   NA 136 12/08/2016 1334   NA 138 02/21/2014 2045   K 3.0 (L) 12/08/2016 1334   K 4.0 02/21/2014 2045   CL 104 12/08/2016 1334   CL 106 02/21/2014 2045   CO2 25 12/08/2016 1334   CO2 27 02/21/2014 2045   BUN 17 12/08/2016 1334   BUN 7  02/21/2014 2045   CREATININE 0.69 12/08/2016 1334   CREATININE 0.85 02/21/2014 2045      Component Value Date/Time   CALCIUM 8.7 (L) 12/08/2016 1334   CALCIUM 9.2 02/21/2014 2045   ALKPHOS 81 12/08/2016 1334   ALKPHOS 87 02/21/2014 2045   AST 20 12/08/2016 1334   AST 24 02/21/2014 2045   ALT 14 12/08/2016 1334   ALT 16 02/21/2014 2045   BILITOT 0.3 12/08/2016 1334   BILITOT 0.3 02/21/2014 2045       RADIOGRAPHIC STUDIES: I have personally reviewed the radiological images as listed and agreed with the findings in the report. No results found.   ASSESSMENT & PLAN:  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[place]) # Stage I diffuse large B-cell lymphoma on our CHOP chemotherapy cycle #2 day #6 today. Improvement of the right neck adenopathy.   # Chest pain- atypical appears noncardiac. Likely musculoskeletal/pleuritic Reviewed the recent 2-D echo normal. However we will check EKG; CK troponin. Also check chest x-ray. Recommend morphine 2 mg IV push 1.  # Chills- no rigors; ANC 1.3. Check blood cultures; start Levaquin 500 mg for 7 days.  # Poor by mouth intake/dehydration recommend IV fluids today.  # Body aches- unclear etiology question Claritin. Recommend Tylenol as needed; also advil up to twice a day for pain x 2-3 days. Morphine 2 mg IVP x1 today.   # Recommend holding amoxicillin/clarithromycin/ recent H.pylori infection- HOLD for above acute issues.   # Difficulty swallowing/pain with swallowing- likely mucositis from chemotherapy; no evidence of any thrush. Recommend magic mouthwash salt and baking soda rinses.  # Follow-up based upon above work up.   # 40 minutes face-to-face with the patient discussing the above plan of care; more than 50% of time spent on  counseling and coordination.   Orders Placed This Encounter  Procedures  . Culture, blood (routine x 2)    Standing Status:  Future    Number of Occurrences:   1    Standing Expiration Date:    12/08/2017  . Culture, blood (routine x 2)    Standing Status:   Future    Number of Occurrences:   1    Standing Expiration Date:   12/08/2017  . DG Chest 2 View    Standing Status:   Future    Standing Expiration Date:   12/08/2017    Order Specific Question:   Reason for Exam (SYMPTOM  OR DIAGNOSIS REQUIRED)    Answer:   chest pain, chills, shortness of breath    Order Specific Question:   Is patient pregnant?    Answer:   No    Order Specific Question:   Preferred imaging location?    Answer:   [facility]  . Troponin I    Standing Status:   Future    Number of Occurrences:   1    Standing Expiration Date:   12/08/2017  . CKMB([facility] only)    Standing Status:   Future    Number of Occurrences:   1    Standing Expiration Date:   12/08/2017  . EKG 12-Lead    Standing Status:   Future    Number of Occurrences:   1    Standing Expiration Date:   12/08/2017    Scheduling Instructions:     Patient in chemotherapy suite    Order Specific Question:   Where should this test be performed    Answer:   Other   All questions were answered. The patient knows to call the clinic with any problems, questions or concerns.      Cammie Sickle, MD 12/08/2016 3:06 PM

## 2016-12-08 NOTE — ED Triage Notes (Signed)
Pt sent over from Cancer center with reports of pt having chest pain that began this am, pt reports that she had ekg and labs drawn there, trop was 0.06. Pt continues to have heaviness feeling and sob with exertion.

## 2016-12-08 NOTE — Progress Notes (Signed)
Patient here for an acute add on.  Patient received adriamycin, vincristine, cytoxan, rituxan on 12/02/16. Presented in clinic today with c/o "ears ringing, chills last evening, an Over all heaviness in my body, I've got a headache and my chest pain that radiates to my shoulder blade. This morning, I'm aching all over even in my low back. My throat is sore. I can eat. I can not swallow. I feel like my neck glands are swollen. I am taking magic mouth wash but it's not helping my throat. I think I need a fluconazole script as I think I have thrust. I had to stop taking Carafate due to gi distress."

## 2016-12-09 ENCOUNTER — Observation Stay: Payer: Managed Care, Other (non HMO)

## 2016-12-09 LAB — BASIC METABOLIC PANEL
Anion gap: 4 — ABNORMAL LOW (ref 5–15)
BUN: 9 mg/dL (ref 6–20)
CALCIUM: 8 mg/dL — AB (ref 8.9–10.3)
CHLORIDE: 104 mmol/L (ref 101–111)
CO2: 27 mmol/L (ref 22–32)
CREATININE: 0.51 mg/dL (ref 0.44–1.00)
GFR calc Af Amer: >60 mL/min (ref >60)
GFR calc non Af Amer: >60 mL/min (ref >60)
GLUCOSE: 86 mg/dL (ref 65–99)
Potassium: 3.5 mmol/L (ref 3.5–5.1)
Sodium: 135 mmol/L (ref 135–145)

## 2016-12-09 LAB — URINALYSIS, COMPLETE (UACMP) WITH MICROSCOPIC
Bacteria, UA: NONE SEEN
Bilirubin Urine: NEGATIVE
GLUCOSE, UA: NEGATIVE mg/dL
Hgb urine dipstick: NEGATIVE
Ketones, ur: NEGATIVE mg/dL
Leukocytes, UA: NEGATIVE
Nitrite: NEGATIVE
PROTEIN: NEGATIVE mg/dL
SQUAMOUS EPITHELIAL / LPF: NONE SEEN
Specific Gravity, Urine: 1.018 (ref 1.005–1.030)
pH: 6 (ref 5.0–8.0)

## 2016-12-09 LAB — TROPONIN I
Troponin I: <0.03 ng/mL (ref <0.03)
Troponin I: <0.03 ng/mL (ref <0.03)
Troponin I: <0.03 ng/mL (ref <0.03)

## 2016-12-09 MED ORDER — MAGNESIUM SULFATE IN D5W 1-5 GM/100ML-% IV SOLN
1.0000 g | Freq: Once | INTRAVENOUS | Status: AC
  Administered 2016-12-09: 1 g via INTRAVENOUS
  Filled 2016-12-09: qty 100

## 2016-12-09 MED ORDER — ACETAMINOPHEN 325 MG PO TABS
650.0000 mg | ORAL_TABLET | Freq: Four times a day (QID) | ORAL | Status: DC | PRN
  Administered 2016-12-09 – 2016-12-10 (×2): 650 mg via ORAL
  Filled 2016-12-09 (×2): qty 2

## 2016-12-09 MED ORDER — ACETAMINOPHEN 650 MG RE SUPP: 650.0000 mg | Freq: Four times a day (QID) | RECTAL | Status: DC | PRN

## 2016-12-09 MED ORDER — STERILE WATER FOR INJECTION IJ SOLN
INTRAMUSCULAR | Status: AC
  Administered 2016-12-09: 23:00:00 10 mL
  Filled 2016-12-09: qty 20

## 2016-12-09 MED ORDER — INFLUENZA VAC SPLIT QUAD 0.5 ML IM SUSY
0.5000 mL | PREFILLED_SYRINGE | INTRAMUSCULAR | Status: DC
  Filled 2016-12-09: qty 0.5

## 2016-12-09 MED ORDER — SODIUM CHLORIDE 0.9 % IV SOLN
INTRAVENOUS | Status: DC
  Administered 2016-12-09 (×2): via INTRAVENOUS

## 2016-12-09 MED ORDER — PROMETHAZINE HCL 25 MG/ML IJ SOLN
25.0000 mg | Freq: Four times a day (QID) | INTRAMUSCULAR | Status: DC | PRN
  Administered 2016-12-09 (×3): 25 mg via INTRAVENOUS
  Filled 2016-12-09 (×3): qty 1

## 2016-12-09 MED ORDER — PANTOPRAZOLE SODIUM 40 MG IV SOLR
40.0000 mg | INTRAVENOUS | Status: DC
  Administered 2016-12-09 – 2016-12-10 (×2): 40 mg via INTRAVENOUS
  Filled 2016-12-09 (×2): qty 40

## 2016-12-09 NOTE — Progress Notes (Signed)
[place] at [place] NAME: Christina [name]    MR#:  [id number]  DATE OF BIRTH:  Sep 20, 1961  SUBJECTIVE: Admitted for chest pain. Patient has a lot of nausea, epigastric pain. Troponins are negative for 3 times. Went to cardiacstress test but because of nausea vomiting she was  sent back to room.Started on IV hydration, IV PPIs, Phenergan, now she feels better and wants to eat something.   CHIEF COMPLAINT:   Chief Complaint  Patient presents with  . Abnormal Lab  . Chest Pain    REVIEW OF SYSTEMS:   ROS CONSTITUTIONAL: No fever, fatigue or weakness.  EYES: No blurred or double vision.  EARS, NOSE, AND THROAT: No tinnitus or ear pain.  RESPIRATORY: No cough, shortness of breath, wheezing or hemoptysis.  CARDIOVASCULAR: No chest pain, orthopnea, edema.  GASTROINTESTINAL: Nausea, vomiting. No  diarrhea or abdominal pain.  GENITOURINARY: No dysuria, hematuria.  ENDOCRINE: No polyuria, nocturia,  HEMATOLOGY: No anemia, easy bruising or bleeding SKIN: No rash or lesion. MUSCULOSKELETAL: No joint pain or arthritis.   NEUROLOGIC: No tingling, numbness, weakness.  PSYCHIATRY: No anxiety or depression.   DRUG ALLERGIES:  No Known Allergies  VITALS:  Blood pressure 117/69, pulse 72, temperature 98 F (36.7 C), temperature source Oral, resp. rate 16, height 5\' 6"  (1.676 m), weight 74.8 kg (165 lb), SpO2 100 %.  PHYSICAL EXAMINATION:  GENERAL:  56 y.o.-year-old patient lying in the bed with no acute distress.  EYES: Pupils equal, round, reactive to light and accommodation. No scleral icterus. Extraocular muscles intact.  HEENT: Head atraumatic, normocephalic. Oropharynx and nasopharynx clear.  NECK:  Supple, no jugular venous distention. No thyroid enlargement, no tenderness.  LUNGS: Normal breath sounds bilaterally, no wheezing, rales,rhonchi or crepitation. No use of accessory muscles of respiration.  CARDIOVASCULAR: S1, S2 normal. No  murmurs, rubs, or gallops.  ABDOMEN: Soft, nontender, nondistended. Bowel sounds present. No organomegaly or mass.  EXTREMITIES: No pedal edema, cyanosis, or clubbing.  NEUROLOGIC: Cranial nerves II through XII are intact. Muscle strength 5/5 in all extremities. Sensation intact. Gait not checked.  PSYCHIATRIC: The patient is alert and oriented x 3.  SKIN: No obvious rash, lesion, or ulcer.    LABORATORY PANEL:   CBC  Recent Labs Lab 12/08/16 1334  WBC 2.1*  HGB 10.4*  HCT 30.6*  PLT 110*   ------------------------------------------------------------------------------------------------------------------  Chemistries   Recent Labs Lab 12/08/16 1334 12/08/16 1638 12/09/16 1158  NA 136  --  135  K 3.0*  --  3.5  CL 104  --  104  CO2 25  --  27  GLUCOSE 123*  --  86  BUN 17  --  9  CREATININE 0.69  --  0.51  CALCIUM 8.7*  --  8.0*  MG  --  1.8  --   AST 20  --   --   ALT 14  --   --   ALKPHOS 81  --   --   BILITOT 0.3  --   --    ------------------------------------------------------------------------------------------------------------------  Cardiac Enzymes  Recent Labs Lab 12/09/16 1158  TROPONINI <0.03   ------------------------------------------------------------------------------------------------------------------  RADIOLOGY:  Ct Angio Chest Pe W And/or Wo Contrast  Result Date: 12/08/2016 CLINICAL DATA:  Chest pain starting this morning, shortness of Breath, history of lymphoma EXAM: CT ANGIOGRAPHY CHEST WITH CONTRAST TECHNIQUE: Multidetector CT imaging of the chest was performed using the standard protocol during bolus administration of intravenous contrast. Multiplanar CT image reconstructions  and MIPs were obtained to evaluate the vascular anatomy. A cap on the power port's tubing loosened and so contrast leaked out during bolus. Repeated study due to poor first study. LP CONTRAST:  75 cc Isovue COMPARISON:  PET scan 11/08/2016 FINDINGS: Cardiovascular:  Cardiac size within normal limits. No pericardial effusion. No evidence of pulmonary embolus. No aortic aneurysm or aortic dissection. The second study is of excellent technical quality. Mediastinum/Nodes: No mediastinal or hilar adenopathy. Central airways are patent. There is right IJ Port-A-Cath with tip in right atrium. Lungs/Pleura: Images of the lung parenchyma shows no infiltrate or pulmonary edema. Mild dependent atelectasis noted bilateral lower lobe posteriorly. Mild dependent atelectasis posterior aspect left upper lobe. No pulmonary mass or pulmonary nodules Upper Abdomen: The visualized upper abdomen shows no adrenal gland mass. Visualized spleen and liver is unremarkable. Musculoskeletal: No destructive bony lesions are noted. Sagittal images of the spine shows mild degenerative changes thoracic spine. Review of the MIP images confirms the above findings. IMPRESSION: 1. No pulmonary embolus is noted. 2. No aortic aneurysm or aortic dissection. 3. No mediastinal or hilar adenopathy. 4. No acute infiltrate or pulmonary edema. Electronically Signed   By: Lahoma Crocker M.D.   On: 12/08/2016 19:00    EKG:   Orders placed or performed during the hospital encounter of 12/08/16  . ED EKG  . ED EKG    ASSESSMENT AND PLAN:   #1. Chest pain secondary to acute gastritis: Troponins are negative for  3 times.. Continue IV hydration, IV PPIs, IV Zofran, IV Phenergan,atient clinically is improving. The patient has no further nausea patient can have Lexiscan stress test tomorrow and discharge tomorrow. Started on liquid diet, advance to soft diet as tolerated. Discussed this with patient, patient's husband, patient's nurse.  #2 history of non-Hodgkin's B-cell lymphoma'follows up with oncology.  #3 history of  musositis.: Patient has Magic mouthwash 5 mg 4 times daily as needed. 4 pancytopenia;, secondary to chemotherapy #4 hypokalemia: Potassium improved from 3-3.5. Continue IV fluids.  All the records  are reviewed and case discussed with Care Management/Social Workerr. Management plans discussed with the patient, family and they are in agreement.  CODE STATUS: full  TOTAL TIME TAKING CARE OF THIS PATIENT: 35 minutes.   POSSIBLE D/C IN 1-2 DAYS, DEPENDING ON CLINICAL CONDITION.   Epifanio Lesches M.D on 12/09/2016 at 2:29 PM  Between 7am to 6pm - Pager - [phone]  After 6pm go to [email or web address] - password [email or web address] [facility]  Office  [phone]  CC: Primary care physician; [name], NP   Note: This dictation was prepared with Dragon dictation along with smaller phrase technology. Any transcriptional errors that result from this process are unintentional.

## 2016-12-09 NOTE — Plan of Care (Signed)
Problem: Education: Goal: Knowledge of [facility] Education information/materials will improve Outcome: Progressing Pt likes to be called Christina [name]  Past Medical History:  Diagnosis Date  . B-cell lymphoma ([place])   . Cancer ([facility])    lymphoma, non hodgekins B cell  . GERD (gastroesophageal reflux disease)    Pt is well controlled with home medications

## 2016-12-09 NOTE — Progress Notes (Signed)
Chaplain visited patient and her husband who was in the room. Patient was very tired due to chemo she received recently. Pt.'s husband said that the first chemo did not have an impact on her strength and her emotional wellbeing as the second one did. Pt.'s husband said that after the second chemo patient seemed to be disoriented. Chaplain prayed with Pt.'s husband.

## 2016-12-09 NOTE — Plan of Care (Addendum)
Text Dr. To request 2nd nausea PRN since patient is still nauseous. Lavender given and warm washcloth for head.

## 2016-12-10 ENCOUNTER — Observation Stay (HOSPITAL_BASED_OUTPATIENT_CLINIC_OR_DEPARTMENT_OTHER): Payer: Managed Care, Other (non HMO)

## 2016-12-10 DIAGNOSIS — R0789 Other chest pain: Secondary | ICD-10-CM | POA: Diagnosis not present

## 2016-12-10 DIAGNOSIS — R079 Chest pain, unspecified: Secondary | ICD-10-CM | POA: Diagnosis not present

## 2016-12-10 LAB — NM MYOCAR MULTI W/PLANAR W/WALL MOTION / EF
CSEPEDS: 0 s
CSEPEW: 1 METS
Exercise duration (min): 0 min
LV sys vol: 24 mL
LVDIAVOL: 57 mL (ref 46–106)
MPHR: 166 {beats}/min
Peak HR: 118 {beats}/min
Percent HR: 71 %
Rest HR: 74 {beats}/min
SDS: 1
SRS: 2
SSS: 1
TID: 0.98

## 2016-12-10 MED ORDER — TECHNETIUM TC 99M TETROFOSMIN IV KIT
32.6100 | PACK | Freq: Once | INTRAVENOUS | Status: AC | PRN
  Administered 2016-12-10: 12:00:00 32.61 via INTRAVENOUS

## 2016-12-10 MED ORDER — TECHNETIUM TC 99M TETROFOSMIN IV KIT
12.2200 | PACK | Freq: Once | INTRAVENOUS | Status: AC | PRN
  Administered 2016-12-10: 11:00:00 12.22 via INTRAVENOUS

## 2016-12-10 MED ORDER — REGADENOSON 0.4 MG/5ML IV SOLN
0.4000 mg | Freq: Once | INTRAVENOUS | Status: AC
  Administered 2016-12-10: 0.4 mg via INTRAVENOUS

## 2016-12-10 MED ORDER — STERILE WATER FOR INJECTION IJ SOLN
INTRAMUSCULAR | Status: AC
  Administered 2016-12-10: 14:00:00 20 mL
  Filled 2016-12-10: qty 20

## 2016-12-10 MED ORDER — SODIUM CHLORIDE 0.9% FLUSH: 10.0000 mL | INTRAVENOUS | Status: DC | PRN

## 2016-12-10 MED ORDER — PROMETHAZINE HCL 50 MG PO TABS: 25.0000 mg | ORAL_TABLET | Freq: Four times a day (QID) | ORAL | 0 refills | Status: DC | PRN

## 2016-12-10 MED ORDER — HEPARIN SOD (PORK) LOCK FLUSH 100 UNIT/ML IV SOLN
500.0000 [IU] | INTRAVENOUS | Status: DC | PRN
  Filled 2016-12-10: qty 5

## 2016-12-10 NOTE — Progress Notes (Signed)
Pt d/ced home.  Pt had lexiscan stress test which Dr. Candis Musa sd looked great.  No ST segment deviation noted during test.  Pt hasn't had any chest pain or nausea. Troponins have been negative 4x.  Hep locked port.  CNA removed peripheral IV. Will go home w/husband.

## 2016-12-10 NOTE — Progress Notes (Signed)
[facility] NAME: Christina [name]    MR#:  [id number]  DATE OF BIRTH:  [birth year]  SUBJECTIVE: Patient feels better, ready to have lexiscan  stress test, no nausea, no vomiting, tolerating the diet. Eager to go home..   CHIEF COMPLAINT:   Chief Complaint  Patient presents with  . Abnormal Lab  . Chest Pain    REVIEW OF SYSTEMS:   ROS CONSTITUTIONAL: No fever, fatigue or weakness.  EYES: No blurred or double vision.  EARS, NOSE, AND THROAT: No tinnitus or ear pain.  RESPIRATORY: No cough, shortness of breath, wheezing or hemoptysis.  CARDIOVASCULAR: No chest pain, orthopnea, edema.  GASTROINTESTINAL:No nausea or vomiting. No  diarrhea or abdominal pain.  GENITOURINARY: No dysuria, hematuria.  ENDOCRINE: No polyuria, nocturia,  HEMATOLOGY: No anemia, easy bruising or bleeding SKIN: No rash or lesion. MUSCULOSKELETAL: No joint pain or arthritis.   NEUROLOGIC: No tingling, numbness, weakness.  PSYCHIATRY: No anxiety or depression.   DRUG ALLERGIES:  No Known Allergies  VITALS:  Blood pressure 105/60, pulse 69, temperature 98.5 F (36.9 C), temperature source Oral, resp. rate 20, height 5\' 6"  (1.676 m), weight 74.8 kg (165 lb), SpO2 100 %.  PHYSICAL EXAMINATION:  GENERAL:  56 y.o.-year-old patient lying in the bed with no acute distress.  EYES: Pupils equal, round, reactive to light and accommodation. No scleral icterus. Extraocular muscles intact.  HEENT: Head atraumatic, normocephalic. Oropharynx and nasopharynx clear.  NECK:  Supple, no jugular venous distention. No thyroid enlargement, no tenderness.  LUNGS: Normal breath sounds bilaterally, no wheezing, rales,rhonchi or crepitation. No use of accessory muscles of respiration.  CARDIOVASCULAR: S1, S2 normal. No murmurs, rubs, or gallops.  ABDOMEN: Soft, nontender, nondistended. Bowel sounds present. No organomegaly or mass.  EXTREMITIES: No pedal edema, cyanosis, or  clubbing.  NEUROLOGIC: Cranial nerves II through XII are intact. Muscle strength 5/5 in all extremities. Sensation intact. Gait not checked.  PSYCHIATRIC: The patient is alert and oriented x 3.  SKIN: No obvious rash, lesion, or ulcer.    LABORATORY PANEL:   CBC  Recent Labs Lab 12/08/16 1334  WBC 2.1*  HGB 10.4*  HCT 30.6*  PLT 110*   ------------------------------------------------------------------------------------------------------------------  Chemistries   Recent Labs Lab 12/08/16 1334 12/08/16 1638 12/09/16 1158  NA 136  --  135  K 3.0*  --  3.5  CL 104  --  104  CO2 25  --  27  GLUCOSE 123*  --  86  BUN 17  --  9  CREATININE 0.69  --  0.51  CALCIUM 8.7*  --  8.0*  MG  --  1.8  --   AST 20  --   --   ALT 14  --   --   ALKPHOS 81  --   --   BILITOT 0.3  --   --    ------------------------------------------------------------------------------------------------------------------  Cardiac Enzymes  Recent Labs Lab 12/09/16 1158  TROPONINI <0.03   ------------------------------------------------------------------------------------------------------------------  RADIOLOGY:  Ct Angio Chest Pe W And/or Wo Contrast  Result Date: 12/08/2016 CLINICAL DATA:  Chest pain starting this morning, shortness of Breath, history of lymphoma EXAM: CT ANGIOGRAPHY CHEST WITH CONTRAST TECHNIQUE: Multidetector CT imaging of the chest was performed using the standard protocol during bolus administration of intravenous contrast. Multiplanar CT image reconstructions and MIPs were obtained to evaluate the vascular anatomy. A cap on the power port's tubing loosened and so contrast leaked out during bolus. Repeated study due  to poor first study. LP CONTRAST:  75 cc Isovue COMPARISON:  PET scan 11/08/2016 FINDINGS: Cardiovascular: Cardiac size within normal limits. No pericardial effusion. No evidence of pulmonary embolus. No aortic aneurysm or aortic dissection. The second study is of  excellent technical quality. Mediastinum/Nodes: No mediastinal or hilar adenopathy. Central airways are patent. There is right IJ Port-A-Cath with tip in right atrium. Lungs/Pleura: Images of the lung parenchyma shows no infiltrate or pulmonary edema. Mild dependent atelectasis noted bilateral lower lobe posteriorly. Mild dependent atelectasis posterior aspect left upper lobe. No pulmonary mass or pulmonary nodules Upper Abdomen: The visualized upper abdomen shows no adrenal gland mass. Visualized spleen and liver is unremarkable. Musculoskeletal: No destructive bony lesions are noted. Sagittal images of the spine shows mild degenerative changes thoracic spine. Review of the MIP images confirms the above findings. IMPRESSION: 1. No pulmonary embolus is noted. 2. No aortic aneurysm or aortic dissection. 3. No mediastinal or hilar adenopathy. 4. No acute infiltrate or pulmonary edema. Electronically Signed   By: Lahoma Crocker M.D.   On: 12/08/2016 19:00    EKG:   Orders placed or performed during the hospital encounter of 12/08/16  . ED EKG  . ED EKG    ASSESSMENT AND PLAN:   #1. Chest pain secondary to acute gastritis: Troponins are negative for  3 times.Vida Roller improving with IV PPI, IV Zofran, IV hydration. Patient has no chest pain today, no nausea or vomiting. Discharge her after Lexiscan stress test results are negative for ischemia. Keep her  nothing by mouth, possible Lexiscan around lunchtime. #2 history of non-Hodgkin's B-cell lymphoma'follows up with oncology. Getting chemotherapy, last chemotherapy was a week ago.  #3 history of  musositis.: Patient has Magic mouthwash 5 mg 4 times daily as needed. 4 pancytopenia;, secondary to chemotherapy #4 hypokalemia: Potassium improved from 3-3.5. . Number All the records are reviewed and case discussed with Care Management/Social Workerr. Management plans discussed with the patient, family and they are in agreement.  CODE STATUS: full  TOTAL  TIME TAKING CARE OF THIS PATIENT: 35 minutes.   POSSIBLE D/C IN 1-2 DAYS, DEPENDING ON CLINICAL CONDITION.   Epifanio Lesches M.D on 12/10/2016 at 8:10 AM  Between 7am to 6pm - Pager - [phone]  After 6pm go to [email or web address] - password [email or web address]  Office  [phone]  CC: Primary care physician; [name], NP   Note: This dictation was prepared with Dragon dictation along with smaller phrase technology. Any transcriptional errors that result from this process are unintentional.

## 2016-12-13 ENCOUNTER — Other Ambulatory Visit: Payer: Self-pay

## 2016-12-13 DIAGNOSIS — K51919 Ulcerative colitis, unspecified with unspecified complications: Secondary | ICD-10-CM

## 2016-12-13 LAB — CULTURE, BLOOD (ROUTINE X 2)
Culture: NO GROWTH
Culture: NO GROWTH

## 2016-12-13 MED ORDER — SUCRALFATE 1 GM/10ML PO SUSP: 1.0000 g | Freq: Four times a day (QID) | ORAL | 1 refills | Status: DC

## 2016-12-13 NOTE — Discharge Summary (Signed)
Christina [name], is a 56 y.o. female  DOB Jan 27, 1961  MRN [id number].  Admission date:  12/08/2016  Admitting Physician  Henreitta Leber, MD  Discharge Date:  12/10/2016   Primary MD  [name], NP  Recommendations for primary care physician for things to follow:  Follow up with primary doctor in 1 week, follow up. Dr. Rogue Bussing  in3-4 days    Admission Diagnosis  Hypokalemia [E87.6] Elevated troponin [R74.8] Chest pain, unspecified type [R07.9] Chest pain [R07.9]   Discharge Diagnosis  Hypokalemia [E87.6] Elevated troponin [R74.8] Chest pain, unspecified type [R07.9] Chest pain [R07.9]   Active Problems:   Chest pain      Past Medical History:  Diagnosis Date  . B-cell lymphoma ([place])   . Cancer (HCC)    lymphoma, non hodgekins B cell  . GERD (gastroesophageal reflux disease)     Past Surgical History:  Procedure Laterality Date  . ABDOMINAL HYSTERECTOMY    . CESAREAN SECTION     x 2   . ESOPHAGOGASTRODUODENOSCOPY (EGD) WITH PROPOFOL N/A 11/11/2016   Procedure: ESOPHAGOGASTRODUODENOSCOPY (EGD) WITH PROPOFOL;  Surgeon: Jonathon Bellows, MD;  Location: [facility] ENDOSCOPY;  Service: Endoscopy;  Laterality: N/A;  . IR GENERIC HISTORICAL  11/03/2016   IR FLUORO GUIDE PORT INSERTION RIGHT 11/03/2016 Aletta Edouard, MD [facility]-INTERV RAD  . REFRACTIVE SURGERY  09/2015       History of present illness and  Hospital Course:     Kindly see H&P for history of present illness and admission details, please review complete Labs, Consult reports and Test reports for all details in brief  HPI  from the history and physical done on the day of admission  56 year old female patient of non-Hodgkin's lymphoma sent from cancer center because of chest pain. Patient monitored on TELEMETRY for evaluation of chest pain.  Hospital  Course  . chest pain secondary to acute gastritis but  Not due to acute coronary syndrome. Troponins have been negative for 3 times, Lexiscan stress test did not show any ischemia. Patient symptoms improved with IV hydration, IV PPIs, IV nausea medicines.  #2. Strep non-Hodgkin's lymphoma: Getting chemotherapy, follows up with cancer Center. #3. pancytopenia secondary to chemotherapy #4. history of mucositis:    Discharge Condition: stable   Follow UP      Discharge Instructions  and  Discharge Medications      Allergies as of 12/10/2016   No Known Allergies     Medication List    TAKE these medications   docusate sodium 100 MG capsule Commonly known as:  COLACE Take by mouth.   estradiol 0.05 mg/24hr patch Commonly known as:  CLIMARA - Dosed in mg/24 hr once a week.   Lactulose 20 GM/30ML Soln Take 30 mLs by mouth every 8 (eight) hours.   lidocaine-prilocaine cream Commonly known as:  EMLA Apply cream 1 hour before chemotherapy treatment, place a small piece of saran wrap over cream to protect clothing   Loratadine 10 MG Caps Take 10 mg by mouth daily.   magic mouthwash w/lidocaine Soln Take 5 mLs by mouth 4 (four) times daily as needed for mouth pain.   omeprazole 20 MG capsule Commonly known as:  PRILOSEC Take 1 capsule (20 mg total) by mouth 2 (two) times daily before a meal.   promethazine 50 MG tablet Commonly known as:  PHENERGAN Take 0.5 tablets (25 mg total) by mouth every 6 (six) hours as needed for nausea or vomiting.  Diet and Activity recommendation: See Discharge Instructions above   Consults obtained -none   Major procedures and Radiology Reports - PLEASE review detailed and final reports for all details, in brief -      Ct Angio Chest Pe W And/or Wo Contrast  Result Date: 12/08/2016 CLINICAL DATA:  Chest pain starting this morning, shortness of Breath, history of lymphoma EXAM: CT ANGIOGRAPHY CHEST WITH CONTRAST  TECHNIQUE: Multidetector CT imaging of the chest was performed using the standard protocol during bolus administration of intravenous contrast. Multiplanar CT image reconstructions and MIPs were obtained to evaluate the vascular anatomy. A cap on the power port's tubing loosened and so contrast leaked out during bolus. Repeated study due to poor first study. LP CONTRAST:  75 cc Isovue COMPARISON:  PET scan 11/08/2016 FINDINGS: Cardiovascular: Cardiac size within normal limits. No pericardial effusion. No evidence of pulmonary embolus. No aortic aneurysm or aortic dissection. The second study is of excellent technical quality. Mediastinum/Nodes: No mediastinal or hilar adenopathy. Central airways are patent. There is right IJ Port-A-Cath with tip in right atrium. Lungs/Pleura: Images of the lung parenchyma shows no infiltrate or pulmonary edema. Mild dependent atelectasis noted bilateral lower lobe posteriorly. Mild dependent atelectasis posterior aspect left upper lobe. No pulmonary mass or pulmonary nodules Upper Abdomen: The visualized upper abdomen shows no adrenal gland mass. Visualized spleen and liver is unremarkable. Musculoskeletal: No destructive bony lesions are noted. Sagittal images of the spine shows mild degenerative changes thoracic spine. Review of the MIP images confirms the above findings. IMPRESSION: 1. No pulmonary embolus is noted. 2. No aortic aneurysm or aortic dissection. 3. No mediastinal or hilar adenopathy. 4. No acute infiltrate or pulmonary edema. Electronically Signed   By: Lahoma Crocker M.D.   On: 12/08/2016 19:00   Nm Myocar Multi W/planar W/wall Motion / Ef  Result Date: 12/10/2016 Pharmacological myocardial perfusion imaging study with no significant ischemia Significant GI uptake artifact noted Normal wall motion, EF estimated at 80% No EKG changes concerning for ischemia at peak stress or in recovery. Low risk scan Signed, Esmond Plants, MD, Ph.D [facility]   Dg Abd 2  Views  Result Date: 11/21/2016 CLINICAL DATA:  No bowel movement for 10 days.  Chemotherapy. EXAM: ABDOMEN - 2 VIEW COMPARISON:  None. FINDINGS: The distal end of a right central line terminates at the caval atrial junction. Minimal opacity in left lung base may represent atelectasis. No free air, portal venous gas, or pneumatosis. Fecal loading from the mid transverse colon through the rectum. No evidence of bowel obstruction. Phleboliths in the pelvis. IMPRESSION: Fecal loading from the mid transverse colon through the rectum. No other acute abnormalities. Electronically Signed   By: Dorise Bullion III M.D   On: 11/21/2016 12:53    Micro Results     Recent Results (from the past 240 hour(s))  Culture, blood (routine x 2)     Status: None   Collection Time: 12/08/16  3:07 PM  Result Value Ref Range Status   Specimen Description BLOOD RIGHT ARM  Final   Special Requests BOTTLES DRAWN AEROBIC AND ANAEROBIC 10 CC  Final   Culture NO GROWTH 5 DAYS  Final   Report Status 12/13/2016 FINAL  Final  Culture, blood (routine x 2)     Status: None   Collection Time: 12/08/16  3:12 PM  Result Value Ref Range Status   Specimen Description BLOOD LEFT ARM  Final   Special Requests BOTTLES DRAWN AEROBIC AND ANAEROBIC 10 CC  Final   Culture NO GROWTH 5 DAYS  Final   Report Status 12/13/2016 FINAL  Final       Today   Subjective:   Christina [name] today has no chest pain, no shortness of breath, no nausea, no vomiting.  Objective:   Blood pressure 116/67, pulse 81, temperature 98.3 F (36.8 C), temperature source Oral, resp. rate 18, height 5\' 6"  (1.676 m), weight 74.8 kg (165 lb), SpO2 100 %.  No intake or output data in the 24 hours ending 12/13/16 1437  Exam Awake Alert, Oriented x 3, No new F.N deficits, Normal affect .AT,PERRAL Supple Neck,No JVD, No cervical lymphadenopathy appriciated.  Symmetrical Chest wall movement, Good air movement bilaterally, CTAB RRR,No Gallops,Rubs or new  Murmurs, No Parasternal Heave +ve B.Sounds, Abd Soft, Non tender, No organomegaly appriciated, No rebound -guarding or rigidity. No Cyanosis, Clubbing or edema, No new Rash or bruise  Data Review   CBC w Diff:  Lab Results  Component Value Date   WBC 2.1 (L) 12/08/2016   HGB 10.4 (L) 12/08/2016   HGB 12.2 02/21/2014   HCT 30.6 (L) 12/08/2016   HCT 37.3 02/21/2014   PLT 110 (L) 12/08/2016   PLT 196 02/21/2014   LYMPHOPCT 32 12/08/2016   LYMPHOPCT 9.2 02/21/2014   MONOPCT 3 12/08/2016   MONOPCT 8.8 02/21/2014   EOSPCT 1 12/08/2016   EOSPCT 0.1 02/21/2014   BASOPCT 0 12/08/2016   BASOPCT 0.2 02/21/2014    CMP:  Lab Results  Component Value Date   NA 135 12/09/2016   NA 138 02/21/2014   K 3.5 12/09/2016   K 4.0 02/21/2014   CL 104 12/09/2016   CL 106 02/21/2014   CO2 27 12/09/2016   CO2 27 02/21/2014   BUN 9 12/09/2016   BUN 7 02/21/2014   CREATININE 0.51 12/09/2016   CREATININE 0.85 02/21/2014   PROT 6.1 (L) 12/08/2016   PROT 7.4 02/21/2014   ALBUMIN 3.3 (L) 12/08/2016   ALBUMIN 3.7 02/21/2014   BILITOT 0.3 12/08/2016   BILITOT 0.3 02/21/2014   ALKPHOS 81 12/08/2016   ALKPHOS 87 02/21/2014   AST 20 12/08/2016   AST 24 02/21/2014   ALT 14 12/08/2016   ALT 16 02/21/2014  .   Total Time in preparing paper work, data evaluation and todays exam - 62 minutes  Christina [name] M.D on 12/10/2016 at 2:37 PM    Note: This dictation was prepared with Dragon dictation along with smaller phrase technology. Any transcriptional errors that result from this process are unintentional.

## 2016-12-15 ENCOUNTER — Telehealth: Payer: Self-pay | Admitting: *Deleted

## 2016-12-15 ENCOUNTER — Emergency Department
Admission: EM | Admit: 2016-12-15 | Discharge: 2016-12-15 | Disposition: A | Discharge status: Home / Self Care | Payer: Managed Care, Other (non HMO) | Attending: Emergency Medicine | Admitting: Emergency Medicine

## 2016-12-15 ENCOUNTER — Encounter: Payer: Self-pay | Admitting: Emergency Medicine

## 2016-12-15 ENCOUNTER — Emergency Department: Payer: Managed Care, Other (non HMO)

## 2016-12-15 ENCOUNTER — Telehealth: Payer: Self-pay

## 2016-12-15 DIAGNOSIS — Z8572 Personal history of non-Hodgkin lymphomas: Secondary | ICD-10-CM | POA: Diagnosis not present

## 2016-12-15 DIAGNOSIS — R1010 Upper abdominal pain, unspecified: Secondary | ICD-10-CM

## 2016-12-15 DIAGNOSIS — K219 Gastro-esophageal reflux disease without esophagitis: Secondary | ICD-10-CM | POA: Insufficient documentation

## 2016-12-15 LAB — COMPREHENSIVE METABOLIC PANEL
ALK PHOS: 99 U/L (ref 38–126)
ALT: 19 U/L (ref 14–54)
AST: 21 U/L (ref 15–41)
Albumin: 4 g/dL (ref 3.5–5.0)
Anion gap: 6 (ref 5–15)
BILIRUBIN TOTAL: 0.2 mg/dL — AB (ref 0.3–1.2)
BUN: 15 mg/dL (ref 6–20)
CO2: 29 mmol/L (ref 22–32)
Calcium: 9.5 mg/dL (ref 8.9–10.3)
Chloride: 105 mmol/L (ref 101–111)
Creatinine, Ser: 0.88 mg/dL (ref 0.44–1.00)
GFR calc Af Amer: >60 mL/min (ref >60)
Glucose, Bld: 115 mg/dL — ABNORMAL HIGH (ref 65–99)
POTASSIUM: 3.8 mmol/L (ref 3.5–5.1)
Sodium: 140 mmol/L (ref 135–145)
Total Protein: 7.1 g/dL (ref 6.5–8.1)

## 2016-12-15 LAB — CBC
HEMATOCRIT: 34.9 % — AB (ref 35.0–47.0)
Hemoglobin: 11.8 g/dL — ABNORMAL LOW (ref 12.0–16.0)
MCH: 29.7 pg (ref 26.0–34.0)
MCHC: 33.9 g/dL (ref 32.0–36.0)
MCV: 87.7 fL (ref 80.0–100.0)
PLATELETS: 186 10*3/uL (ref 150–440)
RBC: 3.98 MIL/uL (ref 3.80–5.20)
RDW: 13.7 % (ref 11.5–14.5)
WBC: 14.1 10*3/uL — AB (ref 3.6–11.0)

## 2016-12-15 LAB — LIPASE, BLOOD: Lipase: <10 U/L — ABNORMAL LOW (ref 11–51)

## 2016-12-15 MED ORDER — DICYCLOMINE HCL 20 MG PO TABS: 20.0000 mg | ORAL_TABLET | Freq: Three times a day (TID) | ORAL | 0 refills | Status: DC | PRN

## 2016-12-15 MED ORDER — OXYCODONE-ACETAMINOPHEN 5-325 MG PO TABS
2.0000 | ORAL_TABLET | Freq: Once | ORAL | Status: AC
  Administered 2016-12-15: 2 via ORAL
  Filled 2016-12-15: qty 2

## 2016-12-15 MED ORDER — GI COCKTAIL ~~LOC~~
30.0000 mL | Freq: Once | ORAL | Status: AC
  Administered 2016-12-15: 30 mL via ORAL
  Filled 2016-12-15: qty 30

## 2016-12-15 MED ORDER — TRAMADOL HCL 50 MG PO TABS: 50.0000 mg | ORAL_TABLET | Freq: Four times a day (QID) | ORAL | 0 refills | Status: DC | PRN

## 2016-12-15 NOTE — Telephone Encounter (Signed)
-----   Message from Jonathon Bellows, MD sent at 12/15/2016  9:26 AM EDT ----- Regarding: RE: Severe Abd Pain 1. If pain is very severe and cancer center feels she needs to go to Er then she has to go.   2. She did have a gastric ulcer on EGD and hence she would need to be seen by the above to determine and rule out any issues related to the ulcer such as perforation with imaging  .   3. Based on the evaluation I will need to see her after or if admitted during the admission to see what we can do for her   Kiran  ----- Message ----- From: Leontine Locket, CMA Sent: 12/15/2016   9:22 AM To: Jonathon Bellows, MD Subject: Severe Abd Pain                                We received a call from the [facility] about this pt stating severe abd pain and inability to eat any food/jello.  Pt states pain didn't start until finishing the H.Pylori medications. Abd pain is so severe that she can no longer stand properly. The [facility] is concerned that she may need another round of meds or admit to the ER.  Please advise.

## 2016-12-15 NOTE — ED Notes (Signed)
Pt discharged home after verbalizing understanding of discharge instructions; nad noted. 

## 2016-12-15 NOTE — ED Notes (Signed)
Patient returned from X-ray 

## 2016-12-15 NOTE — ED Provider Notes (Addendum)
[facility] Emergency Department Provider Note       Time seen: ----------------------------------------- 2:32 PM on 12/15/2016 -----------------------------------------     I have reviewed the triage vital signs and the nursing notes.   HISTORY   Chief Complaint Abdominal Pain    HPI Christina [name] is a 56 y.o. female who presents to the ED for mid and upper abdominal pain since Saturday. Patient seems to worse at night, she is placed on Carafate by her GI doctor and recently has been treated for H. pylori. Recently she was admitted for chest pain and released on Friday. Chest pain was noncardiac in origin. She reports the pain has been unbearable tonight and she can't sleep or eat. She had been taking antibiotics for H. pylori but decided to stop all her medicines yesterday and has been drinking milk to coat her stomach. She reports very poor by mouth intake, current pain is 6 out of 10 on the upper abdomen.   Past Medical History:  Diagnosis Date  . B-cell lymphoma ([place])   . Cancer (HCC)    lymphoma, non hodgekins B cell  . GERD (gastroesophageal reflux disease)     Patient Active Problem List   Diagnosis Date Noted  . Dehydration 12/08/2016  . Chest pain 12/08/2016  . Goals of care, counseling/discussion 11/11/2016  . 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 ABDOMINAL HYSTERECTOMY    . CESAREAN SECTION     x 2   . ESOPHAGOGASTRODUODENOSCOPY (EGD) WITH PROPOFOL N/A 11/11/2016   Procedure: ESOPHAGOGASTRODUODENOSCOPY (EGD) WITH PROPOFOL;  Surgeon: Jonathon Bellows, MD;  Location: [facility] ENDOSCOPY;  Service: Endoscopy;  Laterality: N/A;  . IR GENERIC HISTORICAL  11/03/2016   IR FLUORO GUIDE PORT INSERTION RIGHT 11/03/2016 Aletta Edouard, MD [facility]-INTERV RAD  . REFRACTIVE SURGERY  09/2015    Allergies Patient has no known allergies.  Social History Social History   Substance Use Topics  . Smoking status: Never Smoker  . Smokeless tobacco: Never Used  . Alcohol use No    Review of Systems Constitutional: Negative for fever. Cardiovascular: Positive for recent chest pain Respiratory: Negative for shortness of breath. Gastrointestinal: Positive for abdominal pain Genitourinary: Negative for dysuria. Musculoskeletal: Negative for back pain. Skin: Negative for rash. Neurological: Negative for headaches, focal weakness or numbness.  10-point ROS otherwise negative.  ____________________________________________   PHYSICAL EXAM:  VITAL SIGNS: ED Triage Vitals  Enc Vitals Group     BP 12/15/16 1138 105/79     Pulse Rate 12/15/16 1138 93     Resp 12/15/16 1138 14     Temp 12/15/16 1138 98.3 F (36.8 C)     Temp Source 12/15/16 1138 Oral     SpO2 12/15/16 1138 98 %     Weight 12/15/16 1139 165 lb (74.8 kg)     Height 12/15/16 1139 5\' 6"  (1.676 m)     Head Circumference --      Peak Flow --      Pain Score 12/15/16 1138 6     Pain Loc --      Pain Edu? --      Excl. in [place]? --     Constitutional: Alert and oriented. Well appearing and in no distress. Eyes: Conjunctivae are normal. PERRL. Normal extraocular movements. ENT   Head: Normocephalic and atraumatic.   Nose: No congestion/rhinnorhea.   Mouth/Throat: Mucous membranes are moist.   Neck:  No stridor. Cardiovascular: Normal rate, regular rhythm. No murmurs, rubs, or gallops. Respiratory: Normal respiratory effort without tachypnea nor retractions. Breath sounds are clear and equal bilaterally. No wheezes/rales/rhonchi. Gastrointestinal: Soft and nontender. Hyperactive bowel sounds. Musculoskeletal: Nontender with normal range of motion in extremities. No lower extremity tenderness nor edema. Neurologic:  Normal speech and language. No gross focal neurologic deficits are appreciated.  Skin:  Skin is warm, dry and intact. No rash noted. Psychiatric: Mood and affect are  normal. Speech and behavior are normal.  ____________________________________________  ED COURSE:  Pertinent labs & imaging results that were available during my care of the patient were reviewed by me and considered in my medical decision making (see chart for details). Patient presents for abdominal pain, we will assess with labs and imaging as indicated. We will also discuss with GI on call.  Note from her recent GI chart: amoxicillin (AMOXIL) 500 MG capsule, clarithromycin (BIAXIN) 500 MG tablet and omeprazole (PRILOSEC) 20 MG capsule. Please call patient as she needs instructions on all her meds. She has been taking them improperly (doubled her amount).   Procedures ____________________________________________   LABS (pertinent positives/negatives)  Labs Reviewed  LIPASE, BLOOD - Abnormal; Notable for the following:       Result Value   Lipase <10 (*)    All other components within normal limits  COMPREHENSIVE METABOLIC PANEL - Abnormal; Notable for the following:    Glucose, Bld 115 (*)    Total Bilirubin 0.2 (*)    All other components within normal limits  CBC - Abnormal; Notable for the following:    WBC 14.1 (*)    Hemoglobin 11.8 (*)    HCT 34.9 (*)    All other components within normal limits  URINALYSIS, COMPLETE (UACMP) WITH MICROSCOPIC  H PYLORI, IGM, IGG, IGA AB    RADIOLOGY Images were viewed by me  IMPRESSION: Mildly increased stool in RIGHT colon.  Decreased stool burden since previous study.  ____________________________________________  FINAL ASSESSMENT AND PLAN  Abdominal pain  Plan: Patient's labs and imaging were dictated above. Patient had presented forAbdominal pain. I discussed with GI on-call no sore well. She had actually taken too much of her triple therapy for H. pylori. At this point was recommended she go home with Carafate, Bentyl and continue outpatient follow-up. Here she was given a GI cocktail with some improvement, She is stable  for outpatient follow-up.   Earleen Newport, MD   Note: This note was generated in part or whole with voice recognition software. Voice recognition is usually quite accurate but there are transcription errors that can and very often do occur. I apologize for any typographical errors that were not detected and corrected.     Earleen Newport, MD 12/15/16 [id number]    Earleen Newport, MD 12/15/16 [phone]

## 2016-12-15 NOTE — ED Notes (Signed)
Pt given urine cup and advised that a specimen is needed.

## 2016-12-15 NOTE — Telephone Encounter (Signed)
Per patients statement of severe abd pain and inability to eat advised pt to be seen at ER. Advised pt to be evaluated for complications concerning ulcer. Dr. Vicente Males is to be notified by ER doc if so.

## 2016-12-15 NOTE — ED Triage Notes (Signed)
Upper mid and middle abd pain since Saturday.  Worse at night.  Already on sucralfate by gi doctor.  Was on abt for h pylori.

## 2016-12-15 NOTE — Telephone Encounter (Signed)
Pt called and left message but I was off and voicemail check late on tues. Her message that she was having pain to stomach-bending over at times. And food even though it was jello, crackers or cream of soup it made pain worse.   She has been having such pain in her stomach and yest. When I spoke to her she did not have such bad pain until she completed the atb for the h pylori. So I called Dr. Vicente Males office today and spoke to his nurse to ask about could it be that the bacteria not gone and that is why she is having so much pain and it is worse after eating jello, crackers, or cream of soup.  She said she would ask Dr. Vicente Males and call pt and the office called and told her to go to ER. She did and she is sleeping but feeling better after she got GI cocktail and percocet.  Her boyfriend that she lives with wanted to know if GI cocktail is something that could be a rx for her if she starts back hurting or the only way to get it is to go to ER.  I was not sure-it looks like some of the ingredients may be narcotic-bell[place]ona is one of the indgredients.  I have sent message to MD for advise and tohave a staff member call them back tom.

## 2016-12-15 NOTE — ED Notes (Signed)
Pt presents with abdominal pain that began on Saturday. She was recently hospitalized for chest pain, released on Friday. Pt reports that the pain has been unbearable at night and that she can't sleep or eat. She has also been taking antibiotics but decided yesterday to stop taking all antibiotics and has only been drinking milk to coat her stomach (hx of ulcer). She states she feels better today than she has, but she knows the pain will increase tonight. She was sent by her GI doc's office.

## 2016-12-16 LAB — H PYLORI, IGM, IGG, IGA AB: H Pylori IgG: 0.86 Index Value — ABNORMAL HIGH (ref 0.00–0.79)

## 2016-12-17 NOTE — Progress Notes (Signed)
[name] do we have an appointment for her to see me early next week ? She needs a H pylori stool antigen. Wrong test done in ER

## 2016-12-20 ENCOUNTER — Other Ambulatory Visit: Payer: Self-pay | Admitting: Gastroenterology

## 2016-12-20 ENCOUNTER — Other Ambulatory Visit: Payer: Self-pay

## 2016-12-20 DIAGNOSIS — A048 Other specified bacterial intestinal infections: Secondary | ICD-10-CM

## 2016-12-21 ENCOUNTER — Other Ambulatory Visit
Admission: RE | Admit: 2016-12-21 | Discharge: 2016-12-21 | Disposition: A | Discharge status: Home / Self Care | Payer: Managed Care, Other (non HMO) | Source: Ambulatory Visit | Attending: Gastroenterology | Admitting: Gastroenterology

## 2016-12-22 ENCOUNTER — Ambulatory Visit: Payer: Managed Care, Other (non HMO) | Admitting: Gastroenterology

## 2016-12-23 ENCOUNTER — Inpatient Hospital Stay: Payer: 59

## 2016-12-23 ENCOUNTER — Inpatient Hospital Stay: Payer: 59 | Attending: Oncology | Admitting: Oncology

## 2016-12-23 VITALS — BP 114/80 | HR 75 | Temp 98.0°F | Resp 18 | Wt 162.1 lb

## 2016-12-23 DIAGNOSIS — Z7689 Persons encountering health services in other specified circumstances: Secondary | ICD-10-CM | POA: Diagnosis not present

## 2016-12-23 DIAGNOSIS — Z5112 Encounter for antineoplastic immunotherapy: Secondary | ICD-10-CM | POA: Diagnosis not present

## 2016-12-23 DIAGNOSIS — R109 Unspecified abdominal pain: Secondary | ICD-10-CM | POA: Diagnosis not present

## 2016-12-23 DIAGNOSIS — Z5111 Encounter for antineoplastic chemotherapy: Secondary | ICD-10-CM

## 2016-12-23 DIAGNOSIS — Z79899 Other long term (current) drug therapy: Secondary | ICD-10-CM

## 2016-12-23 DIAGNOSIS — Z8711 Personal history of peptic ulcer disease: Secondary | ICD-10-CM | POA: Diagnosis not present

## 2016-12-23 DIAGNOSIS — Z803 Family history of malignant neoplasm of breast: Secondary | ICD-10-CM

## 2016-12-23 DIAGNOSIS — C8331 Diffuse large B-cell lymphoma, lymph nodes of head, face, and neck: Secondary | ICD-10-CM

## 2016-12-23 DIAGNOSIS — Z801 Family history of malignant neoplasm of trachea, bronchus and lung: Secondary | ICD-10-CM | POA: Diagnosis not present

## 2016-12-23 DIAGNOSIS — K219 Gastro-esophageal reflux disease without esophagitis: Secondary | ICD-10-CM | POA: Diagnosis not present

## 2016-12-23 DIAGNOSIS — R5383 Other fatigue: Secondary | ICD-10-CM

## 2016-12-23 DIAGNOSIS — Z8719 Personal history of other diseases of the digestive system: Secondary | ICD-10-CM | POA: Insufficient documentation

## 2016-12-23 LAB — CBC WITH DIFFERENTIAL/PLATELET
Basophils Absolute: 0.1 10*3/uL (ref 0–0.1)
Basophils Relative: 1 %
Eosinophils Absolute: 0 10*3/uL (ref 0–0.7)
Eosinophils Relative: 0 %
HEMATOCRIT: 31.6 % — AB (ref 35.0–47.0)
HEMOGLOBIN: 10.9 g/dL — AB (ref 12.0–16.0)
LYMPHS ABS: 0.9 10*3/uL — AB (ref 1.0–3.6)
Lymphocytes Relative: 16 %
MCH: 30.1 pg (ref 26.0–34.0)
MCHC: 34.4 g/dL (ref 32.0–36.0)
MCV: 87.5 fL (ref 80.0–100.0)
MONOS PCT: 11 %
Monocytes Absolute: 0.6 10*3/uL (ref 0.2–0.9)
NEUTROS PCT: 72 %
Neutro Abs: 3.9 10*3/uL (ref 1.4–6.5)
Platelets: 311 10*3/uL (ref 150–440)
RBC: 3.61 MIL/uL — AB (ref 3.80–5.20)
RDW: 15.7 % — ABNORMAL HIGH (ref 11.5–14.5)
WBC: 5.4 10*3/uL (ref 3.6–11.0)

## 2016-12-23 LAB — COMPREHENSIVE METABOLIC PANEL
ALK PHOS: 67 U/L (ref 38–126)
ALT: 19 U/L (ref 14–54)
AST: 24 U/L (ref 15–41)
Albumin: 3.8 g/dL (ref 3.5–5.0)
Anion gap: 5 (ref 5–15)
BUN: 9 mg/dL (ref 6–20)
CALCIUM: 9.1 mg/dL (ref 8.9–10.3)
CO2: 24 mmol/L (ref 22–32)
CREATININE: 0.7 mg/dL (ref 0.44–1.00)
Chloride: 108 mmol/L (ref 101–111)
Glucose, Bld: 122 mg/dL — ABNORMAL HIGH (ref 65–99)
Potassium: 3.8 mmol/L (ref 3.5–5.1)
Sodium: 137 mmol/L (ref 135–145)
TOTAL PROTEIN: 6.7 g/dL (ref 6.5–8.1)
Total Bilirubin: 0.4 mg/dL (ref 0.3–1.2)

## 2016-12-23 MED ORDER — ACETAMINOPHEN 325 MG PO TABS
650.0000 mg | ORAL_TABLET | Freq: Once | ORAL | Status: AC
  Administered 2016-12-23: 650 mg via ORAL
  Filled 2016-12-23: qty 2

## 2016-12-23 MED ORDER — PALONOSETRON HCL INJECTION 0.25 MG/5ML
0.2500 mg | Freq: Once | INTRAVENOUS | Status: AC
  Administered 2016-12-23: 0.25 mg via INTRAVENOUS
  Filled 2016-12-23: qty 5

## 2016-12-23 MED ORDER — SODIUM CHLORIDE 0.9 % IV SOLN: 375.0000 mg/m2 | Freq: Once | INTRAVENOUS | Status: DC

## 2016-12-23 MED ORDER — HEPARIN SOD (PORK) LOCK FLUSH 100 UNIT/ML IV SOLN
500.0000 [IU] | Freq: Once | INTRAVENOUS | Status: AC | PRN
Start: 2016-12-23 — End: 2016-12-23
  Administered 2016-12-23: 500 [IU]
  Filled 2016-12-23: qty 5

## 2016-12-23 MED ORDER — DEXAMETHASONE SODIUM PHOSPHATE 10 MG/ML IJ SOLN
10.0000 mg | Freq: Once | INTRAMUSCULAR | Status: AC
  Administered 2016-12-23: 10 mg via INTRAVENOUS
  Filled 2016-12-23: qty 1

## 2016-12-23 MED ORDER — SODIUM CHLORIDE 0.9 % IV SOLN
375.0000 mg/m2 | Freq: Once | INTRAVENOUS | Status: AC
  Administered 2016-12-23: 700 mg via INTRAVENOUS
  Filled 2016-12-23: qty 50

## 2016-12-23 MED ORDER — DOXORUBICIN HCL CHEMO IV INJECTION 2 MG/ML
50.0000 mg/m2 | Freq: Once | INTRAVENOUS | Status: AC
  Administered 2016-12-23: 94 mg via INTRAVENOUS
  Filled 2016-12-23: qty 47

## 2016-12-23 MED ORDER — VINCRISTINE SULFATE CHEMO INJECTION 1 MG/ML
2.0000 mg | Freq: Once | INTRAVENOUS | Status: AC
  Administered 2016-12-23: 2 mg via INTRAVENOUS
  Filled 2016-12-23: qty 2

## 2016-12-23 MED ORDER — DIPHENHYDRAMINE HCL 25 MG PO CAPS
50.0000 mg | ORAL_CAPSULE | Freq: Once | ORAL | Status: AC
  Administered 2016-12-23: 50 mg via ORAL
  Filled 2016-12-23: qty 2

## 2016-12-23 MED ORDER — PEGFILGRASTIM 6 MG/0.6ML ~~LOC~~ PSKT
6.0000 mg | PREFILLED_SYRINGE | Freq: Once | SUBCUTANEOUS | Status: AC
  Administered 2016-12-23: 6 mg via SUBCUTANEOUS
  Filled 2016-12-23: qty 0.6

## 2016-12-23 MED ORDER — SODIUM CHLORIDE 0.9 % IV SOLN
Freq: Once | INTRAVENOUS | Status: AC
  Administered 2016-12-23: 12:00:00 via INTRAVENOUS
  Filled 2016-12-23: qty 1000

## 2016-12-23 MED ORDER — SODIUM CHLORIDE 0.9% FLUSH
10.0000 mL | INTRAVENOUS | Status: DC | PRN
  Administered 2016-12-23: 10 mL
  Filled 2016-12-23: qty 10

## 2016-12-23 MED ORDER — CYCLOPHOSPHAMIDE CHEMO INJECTION 1 GM
750.0000 mg/m2 | Freq: Once | INTRAMUSCULAR | Status: AC
  Administered 2016-12-23: 1400 mg via INTRAVENOUS
  Filled 2016-12-23: qty 50

## 2016-12-23 NOTE — Progress Notes (Signed)
Dr. Janese Banks pul patient on allopurinol and she finished it about 2 weeks ago.  Patient wants to know if she should continue.  If so will need a new rx.  Patient states she has been extremely sleepy the past 4 days.

## 2016-12-23 NOTE — Progress Notes (Signed)
Nutrition Follow-up:   Nutrition follow-up completed during infusion this pm.  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  Pet scan planned for 2 weeks out.   Reviewed medical chart and noted 2 recent hospital visits one for chest pain and the other for abdominal pain.  Patient reports was not able to really eat or drink anything for about 1 week starting on 3/28 due to abdominal pain.  Patient reports that after receiving GI cocktail in ER abdominal pain is much improved and her appetite has returned.  Has not had follow-up with GI MD yet.   Patient reports that she is eating everything without difficulty now. Patient reports that she is drinking [employer] nutrition shake   Medications: reviewed  Labs: glucose 122  Anthropometrics:   Weight today was 162 pounds decreased from 3/28 and 3/21 of 165 pounds but increased from weight of 160 pounds on 3/15 day of assessment.   NUTRITION DIAGNOSIS: Food and nutrition related knowledge deficit improved   MALNUTRITION DIAGNOSIS: none at this time   INTERVENTION:   Encouraged patient to continue eating well-balanced diet focusing on good sources of protein.  Patient verbalized understanding.  Discussed oral nutrition supplements and how they can be used if needed.      MONITORING, EVALUATION, GOAL: Patient will consume adequate calories and protein to maintain weight.   NEXT VISIT: as needed.  Patient to call if needed   Katurah Karapetian B. Zenia Resides, [place], [place] Registered Dietitian [phone] (pager)

## 2016-12-24 ENCOUNTER — Other Ambulatory Visit: Payer: Self-pay

## 2016-12-24 ENCOUNTER — Other Ambulatory Visit
Admission: RE | Admit: 2016-12-24 | Discharge: 2016-12-24 | Disposition: A | Discharge status: Home / Self Care | Payer: Managed Care, Other (non HMO) | Source: Ambulatory Visit | Attending: Gastroenterology | Admitting: Gastroenterology

## 2016-12-24 DIAGNOSIS — A048 Other specified bacterial intestinal infections: Secondary | ICD-10-CM | POA: Insufficient documentation

## 2016-12-24 DIAGNOSIS — K219 Gastro-esophageal reflux disease without esophagitis: Secondary | ICD-10-CM

## 2016-12-24 MED ORDER — DICYCLOMINE HCL 10 MG PO CAPS: 10.0000 mg | ORAL_CAPSULE | Freq: Three times a day (TID) | ORAL | 0 refills | Status: DC

## 2016-12-26 NOTE — Progress Notes (Signed)
[facility] OFFICE PROGRESS NOTE  Patient Care Team: [name], NP as PCP - General (Nurse Practitioner)  Cancer Staging Diffuse large B-cell lymphoma of lymph nodes of neck [facility]) Staging form: Hodgkin and Non-Hodgkin Lymphoma, AJCC 8th Edition - Clinical stage from 11/23/2016: Stage I (Diffuse large B-cell lymphoma) - Signed by Sindy Guadeloupe, MD on 11/23/2016     INTERVAL HISTORY:  Christina [name] 56 y.o.  female who presents to clinic today for further evaluation and consideration of cycle 3 of R CHOP chemotherapy. She has increased fatigue, but otherwise feels well. She does not complain of pain today. She has no neurologic complaints. She denies any recent fevers or illnesses. She has a fair appetite, but does not complain of dysphagia today. She has no chest pain or shortness of breath. She denies any nausea, vomiting, constipation, or diarrhea. She has no urinary complaints. Patient otherwise feels well and offers no further specific complaints.   REVIEW OF SYSTEMS: Review of Systems  Constitutional: Positive for malaise/fatigue. Negative for fever and weight loss.  HENT: Negative.   Respiratory: Negative.  Negative for cough and shortness of breath.   Cardiovascular: Negative.  Negative for chest pain and leg swelling.  Gastrointestinal: Negative.  Negative for abdominal pain.  Genitourinary: Negative.   Musculoskeletal: Negative.   Neurological: Positive for weakness.  Psychiatric/Behavioral: The patient is nervous/anxious.     A complete 10 point review of system is done which is negative except mentioned above/history of present illness.   PAST MEDICAL HISTORY :  Past Medical History:  Diagnosis Date  . B-cell lymphoma ([place])   . Cancer (HCC)    lymphoma, non hodgekins B cell  . GERD (gastroesophageal reflux disease)     PAST SURGICAL HISTORY :   Past Surgical History:  Procedure Laterality Date  . ABDOMINAL HYSTERECTOMY    . CESAREAN SECTION     x 2    . ESOPHAGOGASTRODUODENOSCOPY (EGD) WITH PROPOFOL N/A 11/11/2016   Procedure: ESOPHAGOGASTRODUODENOSCOPY (EGD) WITH PROPOFOL;  Surgeon: Jonathon Bellows, MD;  Location: [facility] ENDOSCOPY;  Service: Endoscopy;  Laterality: N/A;  . IR GENERIC HISTORICAL  11/03/2016   IR FLUORO GUIDE PORT INSERTION RIGHT 11/03/2016 Aletta Edouard, MD [facility]-INTERV RAD  . REFRACTIVE SURGERY  09/2015    FAMILY HISTORY :   Family History  Problem Relation Age of Onset  . Cancer Mother     breast  . Cancer Father     lung  . Heart disease Brother 60    MI  . Heart disease Paternal Grandfather     MI    SOCIAL HISTORY:   Social History  Substance Use Topics  . Smoking status: Never Smoker  . Smokeless tobacco: Never Used  . Alcohol use No    ALLERGIES:  has No Known Allergies.  MEDICATIONS:  Current Outpatient Prescriptions  Medication Sig Dispense Refill  . docusate sodium (COLACE) 100 MG capsule Take by mouth.    . doxycycline (VIBRA-TABS) 100 MG tablet     . estradiol (CLIMARA - DOSED IN MG/24 HR) 0.05 mg/24hr patch once a week.    Marland Kitchen FLONASE ALLERGY RELIEF 50 MCG/ACT nasal spray     . Lactulose 20 GM/30ML SOLN Take 30 mLs by mouth every 8 (eight) hours.    . lidocaine-prilocaine (EMLA) cream Apply cream 1 hour before chemotherapy treatment, place a small piece of saran wrap over cream to protect clothing 30 g 1  . Loratadine 10 MG CAPS Take 10 mg by mouth  daily.     Marland Kitchen LORazepam (ATIVAN) 0.5 MG tablet TAKE 1 TABLET BY MOUTH EVERY 6 HOURS AS NEEDED FOR NAUSEA/ VOMITING  0  . magic mouthwash SOLN SWISH AND SWALLOW 5ML BY MOUTH 4 TIMES A DAY AS NEEDED  0  . magic mouthwash w/lidocaine SOLN Take 5 mLs by mouth 4 (four) times daily as needed for mouth pain. 360 mL 1  . nystatin (MYCOSTATIN) 100000 UNIT/ML suspension     . omeprazole (PRILOSEC) 20 MG capsule Take 1 capsule (20 mg total) by mouth 2 (two) times daily before a meal. 60 capsule 3  . ondansetron (ZOFRAN) 8 MG tablet TAKE 1 TAB TWICE A DAY AS NEEDED  FOR REFRACTORY NAUSEA/ VOMITING. STARTING ON DAY 3 AFTER EACH CHEMO  1  . predniSONE (DELTASONE) 50 MG tablet TAKE 2 TABLETS BY MOUTH DAILY WITH BREAKFAST FOR 5 DAYS WITH EACH TREATMENT OF CHEMO RCHOP  3  . prochlorperazine (COMPAZINE) 10 MG tablet TAKE 1 TABLET (10 MG TOTAL) BY MOUTH EVERY 6 (SIX) HOURS AS NEEDED FOR NAUSEA OR VOMITING.  3  . promethazine (PHENERGAN) 25 MG tablet     . promethazine (PHENERGAN) 50 MG tablet Take 0.5 tablets (25 mg total) by mouth every 6 (six) hours as needed for nausea or vomiting. 30 tablet 0  . sucralfate (CARAFATE) 1 g tablet PLACE IN 30 MLS WATER, MIX SWISH AND SWALLOW 30 SECONDS BEFORE MEALS AND AT BEDTIME  0  . sucralfate (CARAFATE) 1 GM/10ML suspension Take 10 mLs (1 g total) by mouth 4 (four) times daily. 420 mL 1  . traMADol (ULTRAM) 50 MG tablet Take 1 tablet (50 mg total) by mouth every 6 (six) hours as needed. 20 tablet 0  . allopurinol (ZYLOPRIM) 300 MG tablet TAKE 1 TABLET (300 MG TOTAL) BY MOUTH DAILY.  1  . amoxicillin (AMOXIL) 500 MG capsule TAKE 2 CAPSULES (1,000 MG TOTAL) BY MOUTH 2 (TWO) TIMES DAILY.  0  . clarithromycin (BIAXIN) 500 MG tablet TAKE 1 TABLET (500 MG TOTAL) BY MOUTH 2 (TWO) TIMES DAILY.  0  . dicyclomine (BENTYL) 10 MG capsule Take 1 capsule (10 mg total) by mouth 4 (four) times daily -  before meals and at bedtime. 90 capsule 0  . levofloxacin (LEVAQUIN) 500 MG tablet      No current facility-administered medications for this visit.     PHYSICAL EXAMINATION: ECOG PERFORMANCE STATUS: 0 - Asymptomatic  BP 114/80 (BP Location: Left Arm, Patient Position: Sitting)   Pulse 75   Temp 98 F (36.7 C) (Tympanic)   Resp 18   Wt 162 lb 1 oz (73.5 kg)   LMP  (LMP Unknown)   BMI 26.16 kg/m    Filed Weights   12/23/16 1146  Weight: 162 lb 1 oz (73.5 kg)    GENERAL: Well-nourished well-developed; Alert, no distress and comfortable.   Accompanied by family. EYES: no pallor or icterus OROPHARYNX: no thrush or ulceration; good  dentition  NECK: supple, no masses felt LYMPH:  no palpable lymphadenopathy in the cervical, axillary or inguinal regions LUNGS: clear to auscultation and  No wheeze or crackles HEART/CVS: regular rate & rhythm and no murmurs; No lower extremity edema ABDOMEN:abdomen soft, non-tender and normal bowel sounds Musculoskeletal:no cyanosis of digits and no clubbing; tenderness noted on palpation over the left scapula; anteriorly left costochondral area. PSYCH: alert & oriented x 3 with fluent speech NEURO: no focal motor/sensory deficits SKIN:  no rashes or significant lesions  LABORATORY DATA:  I have reviewed  the data as listed    Component Value Date/Time   NA 137 12/23/2016 1114   NA 138 02/21/2014 2045   K 3.8 12/23/2016 1114   K 4.0 02/21/2014 2045   CL 108 12/23/2016 1114   CL 106 02/21/2014 2045   CO2 24 12/23/2016 1114   CO2 27 02/21/2014 2045   GLUCOSE 122 (H) 12/23/2016 1114   GLUCOSE 98 02/21/2014 2045   BUN 9 12/23/2016 1114   BUN 7 02/21/2014 2045   CREATININE 0.70 12/23/2016 1114   CREATININE 0.85 02/21/2014 2045   CALCIUM 9.1 12/23/2016 1114   CALCIUM 9.2 02/21/2014 2045   PROT 6.7 12/23/2016 1114   PROT 7.4 02/21/2014 2045   ALBUMIN 3.8 12/23/2016 1114   ALBUMIN 3.7 02/21/2014 2045   AST 24 12/23/2016 1114   AST 24 02/21/2014 2045   ALT 19 12/23/2016 1114   ALT 16 02/21/2014 2045   ALKPHOS 67 12/23/2016 1114   ALKPHOS 87 02/21/2014 2045   BILITOT 0.4 12/23/2016 1114   BILITOT 0.3 02/21/2014 2045   GFRNONAA >60 12/23/2016 1114   GFRNONAA >60 02/21/2014 2045   GFRAA >60 12/23/2016 1114   GFRAA >60 02/21/2014 2045    No results found for: SPEP, UPEP  Lab Results  Component Value Date   WBC 5.4 12/23/2016   NEUTROABS 3.9 12/23/2016   HGB 10.9 (L) 12/23/2016   HCT 31.6 (L) 12/23/2016   MCV 87.5 12/23/2016   PLT 311 12/23/2016      Chemistry      Component Value Date/Time   NA 137 12/23/2016 1114   NA 138 02/21/2014 2045   K 3.8 12/23/2016 1114    K 4.0 02/21/2014 2045   CL 108 12/23/2016 1114   CL 106 02/21/2014 2045   CO2 24 12/23/2016 1114   CO2 27 02/21/2014 2045   BUN 9 12/23/2016 1114   BUN 7 02/21/2014 2045   CREATININE 0.70 12/23/2016 1114   CREATININE 0.85 02/21/2014 2045      Component Value Date/Time   CALCIUM 9.1 12/23/2016 1114   CALCIUM 9.2 02/21/2014 2045   ALKPHOS 67 12/23/2016 1114   ALKPHOS 87 02/21/2014 2045   AST 24 12/23/2016 1114   AST 24 02/21/2014 2045   ALT 19 12/23/2016 1114   ALT 16 02/21/2014 2045   BILITOT 0.4 12/23/2016 1114   BILITOT 0.3 02/21/2014 2045       RADIOGRAPHIC STUDIES: I have personally reviewed the radiological images as listed and agreed with the findings in the report. No results found.   ASSESSMENT & PLAN:   56 y.o. 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 Proceed with cycle # 3 of chemotherapy. Plan is to reimage with PET scan in several weeks. If there is no evidence of disease, patient will be evaluated by radiation oncology for XRT. If PET scan is positive, she will return to clinic in 3 weeks for consideration of cycle 4. 2. Patient has also been instructed to discontinue allopurinol.   Total face to face encounter time for this patient visit was 30 min. >50% of the time was  spent in counseling and coordination of care.     Visit Diagnosis 1. 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 Encounter for antineoplastic chemotherapy       All questions were answered. The patient knows to call the clinic with any problems, questions or concerns.      Kathlene November  Grayland Ormond, MD 12/26/2016 2:44 PM

## 2016-12-27 LAB — H. PYLORI ANTIGEN, STOOL: H. PYLORI STOOL AG, EIA: NEGATIVE

## 2016-12-28 ENCOUNTER — Ambulatory Visit: Payer: Managed Care, Other (non HMO)

## 2016-12-28 NOTE — Addendum Note (Signed)
Encounter addended by: Kristin Bruins on: 12/28/2016  2:40 PM<BR>    Actions taken: Charge Capture section accepted

## 2016-12-30 ENCOUNTER — Other Ambulatory Visit: Payer: Self-pay | Admitting: *Deleted

## 2016-12-30 DIAGNOSIS — C8331 Diffuse large B-cell lymphoma, lymph nodes of head, face, and neck: Secondary | ICD-10-CM

## 2017-01-04 ENCOUNTER — Ambulatory Visit: Payer: Managed Care, Other (non HMO) | Admitting: Oncology

## 2017-01-06 ENCOUNTER — Ambulatory Visit
Admission: RE | Admit: 2017-01-06 | Discharge: 2017-01-06 | Disposition: A | Discharge status: Home / Self Care | Payer: Managed Care, Other (non HMO) | Source: Ambulatory Visit | Attending: Oncology | Admitting: Oncology

## 2017-01-06 DIAGNOSIS — C8331 Diffuse large B-cell lymphoma, lymph nodes of head, face, and neck: Secondary | ICD-10-CM | POA: Insufficient documentation

## 2017-01-06 LAB — GLUCOSE, CAPILLARY: GLUCOSE-CAPILLARY: 75 mg/dL (ref 65–99)

## 2017-01-06 MED ORDER — FLUDEOXYGLUCOSE F - 18 (FDG) INJECTION
12.1000 | Freq: Once | INTRAVENOUS | Status: AC | PRN
  Administered 2017-01-06: 12.1 via INTRAVENOUS

## 2017-01-07 ENCOUNTER — Inpatient Hospital Stay (HOSPITAL_BASED_OUTPATIENT_CLINIC_OR_DEPARTMENT_OTHER): Payer: 59 | Admitting: Oncology

## 2017-01-07 ENCOUNTER — Encounter: Payer: Self-pay | Admitting: Oncology

## 2017-01-07 ENCOUNTER — Inpatient Hospital Stay: Payer: 59

## 2017-01-07 VITALS — BP 135/83 | HR 73 | Temp 97.4°F | Resp 18 | Wt 163.3 lb

## 2017-01-07 DIAGNOSIS — Z801 Family history of malignant neoplasm of trachea, bronchus and lung: Secondary | ICD-10-CM

## 2017-01-07 DIAGNOSIS — Z5111 Encounter for antineoplastic chemotherapy: Secondary | ICD-10-CM | POA: Diagnosis not present

## 2017-01-07 DIAGNOSIS — R5383 Other fatigue: Secondary | ICD-10-CM

## 2017-01-07 DIAGNOSIS — Z8719 Personal history of other diseases of the digestive system: Secondary | ICD-10-CM

## 2017-01-07 DIAGNOSIS — Z79899 Other long term (current) drug therapy: Secondary | ICD-10-CM | POA: Diagnosis not present

## 2017-01-07 DIAGNOSIS — Z7689 Persons encountering health services in other specified circumstances: Secondary | ICD-10-CM | POA: Diagnosis not present

## 2017-01-07 DIAGNOSIS — C8331 Diffuse large B-cell lymphoma, lymph nodes of head, face, and neck: Secondary | ICD-10-CM | POA: Diagnosis not present

## 2017-01-07 DIAGNOSIS — Z803 Family history of malignant neoplasm of breast: Secondary | ICD-10-CM | POA: Diagnosis not present

## 2017-01-07 DIAGNOSIS — Z8711 Personal history of peptic ulcer disease: Secondary | ICD-10-CM | POA: Diagnosis not present

## 2017-01-07 DIAGNOSIS — K219 Gastro-esophageal reflux disease without esophagitis: Secondary | ICD-10-CM

## 2017-01-07 DIAGNOSIS — R109 Unspecified abdominal pain: Secondary | ICD-10-CM | POA: Diagnosis not present

## 2017-01-07 LAB — COMPREHENSIVE METABOLIC PANEL
ALK PHOS: 117 U/L (ref 38–126)
ALT: 21 U/L (ref 14–54)
AST: 18 U/L (ref 15–41)
Albumin: 3.9 g/dL (ref 3.5–5.0)
Anion gap: 5 (ref 5–15)
BILIRUBIN TOTAL: 0.3 mg/dL (ref 0.3–1.2)
BUN: 12 mg/dL (ref 6–20)
CALCIUM: 9.6 mg/dL (ref 8.9–10.3)
CO2: 28 mmol/L (ref 22–32)
CREATININE: 0.81 mg/dL (ref 0.44–1.00)
Chloride: 104 mmol/L (ref 101–111)
GFR calc Af Amer: >60 mL/min (ref >60)
GLUCOSE: 107 mg/dL — AB (ref 65–99)
POTASSIUM: 4.1 mmol/L (ref 3.5–5.1)
Sodium: 137 mmol/L (ref 135–145)
TOTAL PROTEIN: 6.9 g/dL (ref 6.5–8.1)

## 2017-01-07 LAB — CBC WITH DIFFERENTIAL/PLATELET
BASOS ABS: 0 10*3/uL (ref 0–0.1)
Basophils Relative: 1 %
Eosinophils Absolute: 0 10*3/uL (ref 0–0.7)
Eosinophils Relative: 0 %
HEMATOCRIT: 31.1 % — AB (ref 35.0–47.0)
HEMOGLOBIN: 10.7 g/dL — AB (ref 12.0–16.0)
LYMPHS PCT: 9 %
Lymphs Abs: 0.9 10*3/uL — ABNORMAL LOW (ref 1.0–3.6)
MCH: 30.6 pg (ref 26.0–34.0)
MCHC: 34.5 g/dL (ref 32.0–36.0)
MCV: 88.9 fL (ref 80.0–100.0)
MONO ABS: 0.8 10*3/uL (ref 0.2–0.9)
Monocytes Relative: 8 %
NEUTROS ABS: 7.8 10*3/uL — AB (ref 1.4–6.5)
NEUTROS PCT: 82 %
Platelets: 223 10*3/uL (ref 150–440)
RBC: 3.5 MIL/uL — ABNORMAL LOW (ref 3.80–5.20)
RDW: 18.3 % — AB (ref 11.5–14.5)
WBC: 9.5 10*3/uL (ref 3.6–11.0)

## 2017-01-07 NOTE — Progress Notes (Signed)
Hematology/Oncology Consult note 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 Patient is a 56 year old female who presented to her primary care doctor with symptoms of right neck swelling in January 2018. Her symptoms of an ongoing since December 2017. Patient works at a [employer] (medial in the swelling but the swelling did not go away. She was then referred to ENT.   2. CT of the soft tissue neck with contrast showed malignant right leg lymphadenopathy with heterogeneous enhancement and extracapsular extension occupying both the right level II and level III nodal stations. Individually B abnormal lymph nodes measure up to 4.2 cm in largest dimension and the conglomerulationof abnormal lymph nodes is 5-5.5 cm across. There is a small but asymmetric and conspicuity 7 mm lymph node along the lower right 3-Bstation. Mass effect on the right carotid space from the abnormal nodes but the major vascular structures in the neck at the skull base including the right IJ remained patent.  3. 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 Cells were CD20 positive and BCL 6 and partially dim BCL-2 positive. B cells were negative for C myc (10%). Mom one was positive in 50% of the cells. Ki-67 was 50%.Bone marrow biopsy was negative for lymphoma involvement  4. Patient is otherwise doing well and denies any symptoms of fevers, chills, unintentional weight loss or drenching night sweats. She does not have any significant comorbidities. Currently reports dry non  productive cough for last 1 week  5. PET/CT showed IMPRESSION: 1. FDG avid malignancy is identified in the level 2 and 3 nodal stations of the right cervical lymph node chain, consistent with the patient's known lymphoma. 2. Focal uptake in the gastric cardia is suspicious for malignancy as well. Recommend direct visualization and biopsy as clinically Warranted.  6. EGD showed diffuse moderately erythematous mucosa without bleeding initial nonbleeding gastric ulcer 6 mm which was biopsied. Findings were consistent with H. pylori gastritis and patient was started on antibiotictherapy for the same. Repeat H pylori testing negative  7. EUS also showed H pylori. There were few B cells noted in lamina propria and clonality studies have been ordered. Initial testing for B cell clonality showed no monoclonal B-cell population detected by Randall Hiss heavy chain PCR. Confirmation testing also did not reveal clonal B cells  8. PET/CT scan after cycles of RCHOP showed: IMPRESSION: 1. Complete metabolic response.  No metabolically active lymphoma. 2. Uniform low-level marrow hypermetabolism throughout the axial skeleton, compatible with reactive marrow state due to chemotherapy. 3. No residual gastric hypermetabolism.    Interval history- doing well. Denies any complaints  ECOG PS- 0 Pain scale- 0   Review of systems- Review of Systems  Constitutional: Negative for chills, fever, malaise/fatigue and weight loss.  HENT: Negative for congestion, ear discharge and nosebleeds.   Eyes: Negative for blurred vision.  Respiratory: Negative for cough, hemoptysis, sputum production, shortness of breath and wheezing.   Cardiovascular: Negative for chest pain, palpitations, orthopnea and claudication.  Gastrointestinal: Negative for abdominal pain, blood in stool, constipation, diarrhea, heartburn, melena, nausea and vomiting.  Genitourinary: Negative for dysuria, flank pain, frequency, hematuria and  urgency.  Musculoskeletal: Negative for back pain, joint pain and myalgias.  Skin: Negative for rash.  Neurological: Negative for dizziness, tingling, focal weakness, seizures, weakness and headaches.  Endo/Heme/Allergies: Does not bruise/bleed easily.  Psychiatric/Behavioral: Negative for depression and suicidal ideas. The patient does not have insomnia.       No Known Allergies   Past Medical History:  Diagnosis Date  . B-cell lymphoma ([place])   . Cancer (HCC)    lymphoma, non hodgekins B cell  . GERD (gastroesophageal reflux disease)      Past Surgical History:  Procedure Laterality Date  . ABDOMINAL HYSTERECTOMY    . CESAREAN SECTION     x 2   . ESOPHAGOGASTRODUODENOSCOPY (EGD) WITH PROPOFOL N/A 11/11/2016   Procedure: ESOPHAGOGASTRODUODENOSCOPY (EGD) WITH PROPOFOL;  Surgeon: Jonathon Bellows, MD;  Location: [facility] ENDOSCOPY;  Service: Endoscopy;  Laterality: N/A;  . IR GENERIC HISTORICAL  11/03/2016   IR FLUORO GUIDE PORT INSERTION RIGHT 11/03/2016 Aletta Edouard, MD [facility]-INTERV RAD  . REFRACTIVE SURGERY  09/2015    Social History   Social History  . Marital status: Single    Spouse name: N/A  . Number of children: N/A  . Years of education: N/A   Occupational History  . Not on file.   Social History Main Topics  . Smoking status: Never Smoker  . Smokeless tobacco: Never Used  . Alcohol use No  . Drug use: No  . Sexual activity: Yes   Other Topics Concern  . Not on file   Social History Narrative  . No narrative on file    Family History  Problem Relation Age of Onset  . Cancer Mother     breast  . Cancer Father     lung  . Heart disease Brother 68    MI  . Heart disease Paternal Grandfather     MI     Current Outpatient Prescriptions:  .  allopurinol (ZYLOPRIM) 300 MG tablet, TAKE 1 TABLET (300 MG TOTAL) BY MOUTH DAILY., Disp: , Rfl: 1 .  amoxicillin (AMOXIL) 500 MG capsule, TAKE 2 CAPSULES (1,000 MG TOTAL) BY MOUTH 2 (TWO) TIMES DAILY., Disp: , Rfl:  0 .  clarithromycin (BIAXIN) 500 MG tablet, TAKE 1 TABLET (500 MG TOTAL) BY MOUTH 2 (TWO) TIMES DAILY., Disp: , Rfl: 0 .  dicyclomine (BENTYL) 10 MG capsule, Take 1 capsule (10 mg total) by mouth 4 (four) times daily -  before meals and at bedtime., Disp: 90 capsule, Rfl: 0 .  docusate sodium (COLACE) 100 MG capsule, Take by mouth., Disp: , Rfl:  .  doxycycline (VIBRA-TABS) 100 MG tablet, , Disp: , Rfl:  .  estradiol (CLIMARA - DOSED IN MG/24 HR) 0.05 mg/24hr patch, once a week., Disp: , Rfl:  .  FLONASE ALLERGY RELIEF 50 MCG/ACT nasal spray, , Disp: , Rfl:  .  Lactulose 20 GM/30ML SOLN, Take 30 mLs by mouth every 8 (eight) hours., Disp: , Rfl:  .  levofloxacin (LEVAQUIN) 500 MG tablet, , Disp: , Rfl:  .  lidocaine-prilocaine (EMLA) cream, Apply cream 1 hour before chemotherapy treatment, place a small piece of saran wrap over cream to protect clothing, Disp: 30 g, Rfl: 1 .  Loratadine 10 MG CAPS, Take 10 mg by mouth daily. , Disp: , Rfl:  .  LORazepam (ATIVAN) 0.5 MG tablet, TAKE 1 TABLET BY MOUTH EVERY 6 HOURS AS NEEDED FOR NAUSEA/ VOMITING, Disp: , Rfl: 0 .  magic  mouthwash SOLN, SWISH AND SWALLOW 5ML BY MOUTH 4 TIMES A DAY AS NEEDED, Disp: , Rfl: 0 .  magic mouthwash w/lidocaine SOLN, Take 5 mLs by mouth 4 (four) times daily as needed for mouth pain., Disp: 360 mL, Rfl: 1 .  nystatin (MYCOSTATIN) 100000 UNIT/ML suspension, , Disp: , Rfl:  .  omeprazole (PRILOSEC) 20 MG capsule, Take 1 capsule (20 mg total) by mouth 2 (two) times daily before a meal., Disp: 60 capsule, Rfl: 3 .  ondansetron (ZOFRAN) 8 MG tablet, TAKE 1 TAB TWICE A DAY AS NEEDED FOR REFRACTORY NAUSEA/ VOMITING. STARTING ON DAY 3 AFTER EACH CHEMO, Disp: , Rfl: 1 .  predniSONE (DELTASONE) 50 MG tablet, TAKE 2 TABLETS BY MOUTH DAILY WITH BREAKFAST FOR 5 DAYS WITH EACH TREATMENT OF CHEMO RCHOP, Disp: , Rfl: 3 .  prochlorperazine (COMPAZINE) 10 MG tablet, TAKE 1 TABLET (10 MG TOTAL) BY MOUTH EVERY 6 (SIX) HOURS AS NEEDED FOR NAUSEA OR  VOMITING., Disp: , Rfl: 3 .  promethazine (PHENERGAN) 25 MG tablet, , Disp: , Rfl:  .  promethazine (PHENERGAN) 50 MG tablet, Take 0.5 tablets (25 mg total) by mouth every 6 (six) hours as needed for nausea or vomiting., Disp: 30 tablet, Rfl: 0 .  sucralfate (CARAFATE) 1 g tablet, PLACE IN 30 MLS WATER, MIX SWISH AND SWALLOW 30 SECONDS BEFORE MEALS AND AT BEDTIME, Disp: , Rfl: 0 .  sucralfate (CARAFATE) 1 GM/10ML suspension, Take 10 mLs (1 g total) by mouth 4 (four) times daily., Disp: 420 mL, Rfl: 1 .  traMADol (ULTRAM) 50 MG tablet, Take 1 tablet (50 mg total) by mouth every 6 (six) hours as needed., Disp: 20 tablet, Rfl: 0  Physical exam:  Vitals:   01/07/17 1353 01/07/17 1359  BP: 135/83 135/83  Pulse: 73 73  Resp: 18 18  Temp: 97.4 F (36.3 C) 97.4 F (36.3 C)  TempSrc: Tympanic Tympanic  Weight: 163 lb 4.8 oz (74.1 kg) 163 lb 4.8 oz (74.1 kg)   Physical Exam  Constitutional: She is oriented to person, place, and time and well-developed, well-nourished, and in no distress.  HENT:  Head: Normocephalic and atraumatic.  Eyes: EOM are normal. Pupils are equal, round, and reactive to light.  Neck: Normal range of motion.  Cardiovascular: Normal rate, regular rhythm and normal heart sounds.   Pulmonary/Chest: Effort normal and breath sounds normal.  Abdominal: Soft. Bowel sounds are normal.  Lymphadenopathy:  No palpable cervical adenopathy  Neurological: She is alert and oriented to person, place, and time.  Skin: Skin is warm and dry.     CMP Latest Ref Rng & Units 12/23/2016  Glucose 65 - 99 mg/dL 122(H)  BUN 6 - 20 mg/dL 9  Creatinine 0.44 - 1.00 mg/dL 0.70  Sodium 135 - 145 mmol/L 137  Potassium 3.5 - 5.1 mmol/L 3.8  Chloride 101 - 111 mmol/L 108  CO2 22 - 32 mmol/L 24  Calcium 8.9 - 10.3 mg/dL 9.1  Total Protein 6.5 - 8.1 g/dL 6.7  Total Bilirubin 0.3 - 1.2 mg/dL 0.4  Alkaline Phos 38 - 126 U/L 67  AST 15 - 41 U/L 24  ALT 14 - 54 U/L 19   CBC Latest Ref Rng & Units  12/23/2016  WBC 3.6 - 11.0 K/uL 5.4  Hemoglobin 12.0 - 16.0 g/dL 10.9(L)  Hematocrit 35.0 - 47.0 % 31.6(L)  Platelets 150 - 440 K/uL 311    No images are attached to the encounter.  Ct Angio Chest Pe W And/or Wo  Contrast  Result Date: 12/08/2016 CLINICAL DATA:  Chest pain starting this morning, shortness of Breath, history of lymphoma EXAM: CT ANGIOGRAPHY CHEST WITH CONTRAST TECHNIQUE: Multidetector CT imaging of the chest was performed using the standard protocol during bolus administration of intravenous contrast. Multiplanar CT image reconstructions and MIPs were obtained to evaluate the vascular anatomy. A cap on the power port's tubing loosened and so contrast leaked out during bolus. Repeated study due to poor first study. LP CONTRAST:  75 cc Isovue COMPARISON:  PET scan 11/08/2016 FINDINGS: Cardiovascular: Cardiac size within normal limits. No pericardial effusion. No evidence of pulmonary embolus. No aortic aneurysm or aortic dissection. The second study is of excellent technical quality. Mediastinum/Nodes: No mediastinal or hilar adenopathy. Central airways are patent. There is right IJ Port-A-Cath with tip in right atrium. Lungs/Pleura: Images of the lung parenchyma shows no infiltrate or pulmonary edema. Mild dependent atelectasis noted bilateral lower lobe posteriorly. Mild dependent atelectasis posterior aspect left upper lobe. No pulmonary mass or pulmonary nodules Upper Abdomen: The visualized upper abdomen shows no adrenal gland mass. Visualized spleen and liver is unremarkable. Musculoskeletal: No destructive bony lesions are noted. Sagittal images of the spine shows mild degenerative changes thoracic spine. Review of the MIP images confirms the above findings. IMPRESSION: 1. No pulmonary embolus is noted. 2. No aortic aneurysm or aortic dissection. 3. No mediastinal or hilar adenopathy. 4. No acute infiltrate or pulmonary edema. Electronically Signed   By: Lahoma Crocker M.D.   On: 12/08/2016  19:00   Nm Myocar Multi W/planar W/wall Motion / Ef  Result Date: 12/10/2016 Pharmacological myocardial perfusion imaging study with no significant ischemia Significant GI uptake artifact noted Normal wall motion, EF estimated at 80% No EKG changes concerning for ischemia at peak stress or in recovery. Low risk scan Signed, Esmond Plants, MD, Ph.D [facility]   Nm Pet Image Restag (ps) Skull Base To Thigh  Result Date: 01/06/2017 CLINICAL DATA:  Subsequent treatment strategy for diffuse large B-cell lymphoma of the right neck status post interval chemotherapy. EXAM: NUCLEAR MEDICINE PET SKULL BASE TO THIGH TECHNIQUE: 12.1 mCi F-18 FDG was injected intravenously. Full-ring PET imaging was performed from the skull base to thigh after the radiotracer. CT data was obtained and used for attenuation correction and anatomic localization. FASTING BLOOD GLUCOSE:  Value: 75 mg/dl COMPARISON:  11/08/2016 PET-CT. FINDINGS: NECK No residual enlarged or hypermetabolic lymph nodes in the neck. CHEST Right internal jugular MediPort terminates in the right atrium. No enlarged or hypermetabolic axillary, mediastinal or hilar lymph nodes. No pleural effusions. No pneumothorax. No acute consolidative airspace disease, lung masses or significant pulmonary nodules. ABDOMEN/PELVIS No residual gastric hypermetabolism. No abnormal hypermetabolic activity within the liver, pancreas, adrenal glands, or spleen. No hypermetabolic lymph nodes in the abdomen or pelvis. Hysterectomy. SKELETON No focal hypermetabolic activity to suggest skeletal metastasis. Uniform low-level marrow hypermetabolism throughout the axial skeleton. IMPRESSION: 1. Complete metabolic response.  No metabolically active lymphoma. 2. Uniform low-level marrow hypermetabolism throughout the axial skeleton, compatible with reactive marrow state due to chemotherapy. 3. No residual gastric hypermetabolism. Electronically Signed   By: Ilona Sorrel M.D.   On: 01/06/2017  15:26   Dg Abd 2 Views  Result Date: 12/15/2016 CLINICAL DATA:  Abdominal pain for 4 days, history of stomach ulcers, lymphoma, GERD EXAM: ABDOMEN - 2 VIEW COMPARISON:  11/21/2016 FINDINGS: Lung bases clear. Nonobstructive bowel gas pattern. Slightly increased stool in ascending colon. Overall decreased stool burden versus prior study. No bowel dilatation or bowel  wall thickening. No free intraperitoneal air. Numerous pelvic phleboliths. Bones demineralized. No urine tract calcification. IMPRESSION: Mildly increased stool in RIGHT colon. Decreased stool burden since previous study. Electronically Signed   By: Lavonia Dana M.D.   On: 12/15/2016 15:28     Assessment and plan- Patient is a 56 y.o. female with h/o DLBCL stage 1 s/p 3 cycles of RCHOP  1. I discussed the results of PET/CT with the patient which shows no hypermtabolism in the neck or the stomach. She has completed her 3 cycles of chemotherapy and has attained CR. I will refer her to rad onc for IFRT at this point  2. Patient did have some hypermetabolism in the gastric antrum at diagnosis. She was eventually found to have diffuse inflammation of her stomach from H pylori gastritis which has resolved with triple antibiotic therapy. No clonal B cells were demonstarted in biopsies done at the time of EUS. EGD biopsy also did nto show lymphoma. No convincing evidence for lymphoma involvement of her stomach. This was likely from H pylori gastritis  I will see her back in 3 montsh with cbc, cmp and lDH. We will monitor her periodically with labs and H &P without imaging. We will plan for port removal at this time,    Visit Diagnosis 1. 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HCC)      Dr. Randa Evens, MD, MPH [facility] at [facility] Pager- [phone] 01/07/2017 2:17 PM

## 2017-01-08 ENCOUNTER — Other Ambulatory Visit: Payer: Self-pay | Admitting: *Deleted

## 2017-01-08 DIAGNOSIS — C8331 Diffuse large B-cell lymphoma, lymph nodes of head, face, and neck: Secondary | ICD-10-CM

## 2017-01-10 ENCOUNTER — Other Ambulatory Visit: Payer: Self-pay | Admitting: *Deleted

## 2017-01-12 ENCOUNTER — Encounter: Payer: Self-pay | Admitting: Radiation Oncology

## 2017-01-12 ENCOUNTER — Ambulatory Visit
Admission: RE | Admit: 2017-01-12 | Discharge: 2017-01-12 | Disposition: A | Discharge status: Home / Self Care | Payer: 59 | Source: Ambulatory Visit | Attending: Radiation Oncology | Admitting: Radiation Oncology

## 2017-01-12 VITALS — BP 113/75 | HR 87 | Temp 97.5°F | Wt 165.5 lb

## 2017-01-12 DIAGNOSIS — Z79899 Other long term (current) drug therapy: Secondary | ICD-10-CM | POA: Insufficient documentation

## 2017-01-12 DIAGNOSIS — C8331 Diffuse large B-cell lymphoma, lymph nodes of head, face, and neck: Secondary | ICD-10-CM | POA: Diagnosis not present

## 2017-01-12 DIAGNOSIS — Z510 Encounter for antineoplastic radiation therapy: Secondary | ICD-10-CM | POA: Insufficient documentation

## 2017-01-12 DIAGNOSIS — Z8719 Personal history of other diseases of the digestive system: Secondary | ICD-10-CM | POA: Diagnosis not present

## 2017-01-12 DIAGNOSIS — Z809 Family history of malignant neoplasm, unspecified: Secondary | ICD-10-CM | POA: Diagnosis not present

## 2017-01-12 DIAGNOSIS — K219 Gastro-esophageal reflux disease without esophagitis: Secondary | ICD-10-CM | POA: Insufficient documentation

## 2017-01-12 NOTE — Consult Note (Signed)
NEW PATIENT EVALUATION  Name: Christina [name]  MRN: [id number]  Date:   01/12/2017     DOB: [birth year]   This 56 y.o. 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  REFERRING PHYSICIAN: [name], NP  CHIEF COMPLAINT:  Chief Complaint  Patient presents with  . Lymphoma    initial evaluation    DIAGNOSIS: The encounter diagnosis was 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[place]).   PREVIOUS INVESTIGATIONS:  PET CT scans reviewed Pathology reviewed Clinical notes reviewed  HPI: Patient is a 56 year old female who presented with sudden onset of right neck swelling back in January 2018. She was eventually seen by ENT and a CT scan demonstrated malignant lymph nodes with extracapsular extension throughout right level II and 3 nodal stations in the right cervical chain measuring up to 4.2 cm. She underwent a core biopsy which was positive for large B cell lymphoma diffuse germinal center type. PET CT scan showed hypermetabolic activity corresponding to the level II and 3 nodal stations of the right cervical chain. There is also some uptake in the gastric antrum which was found to be H. pylori infection treated with antibiotic therapy. Patient underwent 3 cycles of R CHOP which she tolerated well. Repeat PET CT scan showed complete normalization with no residual disease noted. She is tolerated her chemotherapy well. She seen today for consideration of involved field radiation therapy. She has some night sweats which he attributes to hot flashes she specifically denies fever chills weight loss.  PLANNED TREATMENT REGIMEN: Involved field radiation therapy  PAST MEDICAL HISTORY:  has a past medical history of B-cell lymphoma ([place]); Cancer ([place]); and GERD (gastroesophageal reflux disease).    PAST  SURGICAL HISTORY:  Past Surgical History:  Procedure Laterality Date  . ABDOMINAL HYSTERECTOMY    . CESAREAN SECTION     x 2   . ESOPHAGOGASTRODUODENOSCOPY (EGD) WITH PROPOFOL N/A 11/11/2016   Procedure: ESOPHAGOGASTRODUODENOSCOPY (EGD) WITH PROPOFOL;  Surgeon: Jonathon Bellows, MD;  Location: [facility] ENDOSCOPY;  Service: Endoscopy;  Laterality: N/A;  . IR GENERIC HISTORICAL  11/03/2016   IR FLUORO GUIDE PORT INSERTION RIGHT 11/03/2016 Aletta Edouard, MD [facility]-INTERV RAD  . REFRACTIVE SURGERY  09/2015    FAMILY HISTORY: family history includes Cancer in her father and mother; Heart disease in her paternal grandfather; Heart disease (age of onset: 53) in her brother.  SOCIAL HISTORY:  reports that she has never smoked. She has never used smokeless tobacco. She reports that she does not drink alcohol or use drugs.  ALLERGIES: Patient has no known allergies.  MEDICATIONS:  Current Outpatient Prescriptions  Medication Sig Dispense Refill  . lidocaine-prilocaine (EMLA) cream Apply cream 1 hour before chemotherapy treatment, place a small piece of saran wrap over cream to protect clothing 30 g 1  . Loratadine 10 MG CAPS Take 10 mg by mouth daily.     Marland Kitchen LORazepam (ATIVAN) 0.5 MG tablet TAKE 1 TABLET BY MOUTH EVERY 6 HOURS AS NEEDED FOR NAUSEA/ VOMITING  0  . magic mouthwash SOLN SWISH AND SWALLOW 5ML BY MOUTH 4 TIMES A DAY AS NEEDED  0  . nystatin (MYCOSTATIN) 100000 UNIT/ML suspension     . omeprazole (PRILOSEC) 20 MG capsule Take 1 capsule (20 mg total) by mouth 2 (two) times daily before a meal. 60 capsule 3  .  ondansetron (ZOFRAN) 8 MG tablet TAKE 1 TAB TWICE A DAY AS NEEDED FOR REFRACTORY NAUSEA/ VOMITING. STARTING ON DAY 3 AFTER EACH CHEMO  1  . predniSONE (DELTASONE) 50 MG tablet TAKE 2 TABLETS BY MOUTH DAILY WITH BREAKFAST FOR 5 DAYS WITH EACH TREATMENT OF CHEMO RCHOP  3  . prochlorperazine (COMPAZINE) 10 MG tablet TAKE 1 TABLET (10 MG TOTAL) BY MOUTH EVERY 6 (SIX) HOURS AS NEEDED FOR NAUSEA OR  VOMITING.  3  . promethazine (PHENERGAN) 25 MG tablet     . sucralfate (CARAFATE) 1 g tablet PLACE IN 30 MLS WATER, MIX SWISH AND SWALLOW 30 SECONDS BEFORE MEALS AND AT BEDTIME  0  . traMADol (ULTRAM) 50 MG tablet Take 1 tablet (50 mg total) by mouth every 6 (six) hours as needed. (Patient not taking: Reported on 01/07/2017) 20 tablet 0   No current facility-administered medications for this encounter.     ECOG PERFORMANCE STATUS:  0 - Asymptomatic  REVIEW OF SYSTEMS:  Patient denies any weight loss, fatigue, weakness, fever, chills or night sweats. Patient denies any loss of vision, blurred vision. Patient denies any ringing  of the ears or hearing loss. No irregular heartbeat. Patient denies heart murmur or history of fainting. Patient denies any chest pain or pain radiating to her upper extremities. Patient denies any shortness of breath, difficulty breathing at night, cough or hemoptysis. Patient denies any swelling in the lower legs. Patient denies any nausea vomiting, vomiting of blood, or coffee ground material in the vomitus. Patient denies any stomach pain. Patient states has had normal bowel movements no significant constipation or diarrhea. Patient denies any dysuria, hematuria or significant nocturia. Patient denies any problems walking, swelling in the joints or loss of balance. Patient denies any skin changes, loss of hair or loss of weight. Patient denies any excessive worrying or anxiety or significant depression. Patient denies any problems with insomnia. Patient denies excessive thirst, polyuria, polydipsia. Patient denies any swollen glands, patient denies easy bruising or easy bleeding. Patient denies any recent infections, allergies or URI. Patient "s visual fields have not changed significantly in recent time.    PHYSICAL EXAM: BP 113/75   Pulse 87   Temp 97.5 F (36.4 C)   Wt 165 lb 7.3 oz (75 kg)   LMP  (LMP Unknown)   BMI 26.71 kg/m  Oral cavity is clear no oral mucosal  lesions are identified. No evidence of subject gastric cervical or supraclavicular adenopathy is appreciated. No other notable abnormality is noted on exam. Well-developed well-nourished patient in NAD. HEENT reveals PERLA, EOMI, discs not visualized.  Oral cavity is clear. No oral mucosal lesions are identified. Neck is clear without evidence of cervical or supraclavicular adenopathy. Lungs are clear to A&P. Cardiac examination is essentially unremarkable with regular rate and rhythm without murmur rub or thrill. Abdomen is benign with no organomegaly or masses noted. Motor sensory and DTR levels are equal and symmetric in the upper and lower extremities. Cranial nerves II through XII are grossly intact. Proprioception is intact. No peripheral adenopathy or edema is identified. No motor or sensory levels are noted. Crude visual fields are within normal range.  LABORATORY DATA: Pathology reports reviewed    RADIOLOGY RESULTS: Serial PET/CT scans reviewed   IMPRESSION: Stage I diffuse B-cell lymphoma in 56 year old female status post 3 cycles of R CHOP with complete response by PET CT criteria  PLAN: At this time based on current standards would recommend involved field radiation therapy. Would plan on delivering  3600 cGy over 3-1/2 weeks using I MRT radiation therapy to spare critical structures such as or salivary glands spine esophagus. Would limit my fields to her right neck. Risks and benefits of treatment including increased chance of slight radiation esophagitis fatigue alteration of taste possible xerostomia skin reaction and alteration of blood counts all were described in detail to the patient and her husband. They both seem to comprehend my treatment plan well.There will be extra effort by both professional staff as well as technical staff to coordinate and manage concurrent chemoradiation and ensuing side effects during her treatments. I personally set up and ordered CT simulation for next  week.  I would like to take this opportunity to thank you for allowing me to participate in the care of your patient.Armstead Peaks., MD

## 2017-01-13 ENCOUNTER — Other Ambulatory Visit: Payer: Managed Care, Other (non HMO)

## 2017-01-13 ENCOUNTER — Ambulatory Visit: Payer: Managed Care, Other (non HMO)

## 2017-01-13 ENCOUNTER — Ambulatory Visit: Payer: Managed Care, Other (non HMO) | Admitting: Oncology

## 2017-01-18 ENCOUNTER — Ambulatory Visit
Admission: RE | Admit: 2017-01-18 | Discharge: 2017-01-18 | Disposition: A | Discharge status: Home / Self Care | Payer: 59 | Source: Ambulatory Visit | Attending: Radiation Oncology | Admitting: Radiation Oncology

## 2017-01-18 DIAGNOSIS — Z510 Encounter for antineoplastic radiation therapy: Secondary | ICD-10-CM | POA: Diagnosis not present

## 2017-01-20 ENCOUNTER — Other Ambulatory Visit: Payer: Self-pay

## 2017-01-20 DIAGNOSIS — Z510 Encounter for antineoplastic radiation therapy: Secondary | ICD-10-CM | POA: Diagnosis not present

## 2017-01-21 ENCOUNTER — Other Ambulatory Visit: Payer: Self-pay | Admitting: *Deleted

## 2017-01-21 DIAGNOSIS — C8331 Diffuse large B-cell lymphoma, lymph nodes of head, face, and neck: Secondary | ICD-10-CM

## 2017-01-26 ENCOUNTER — Ambulatory Visit
Admission: RE | Admit: 2017-01-26 | Discharge: 2017-01-26 | Disposition: A | Discharge status: Home / Self Care | Payer: 59 | Source: Ambulatory Visit | Attending: Radiation Oncology | Admitting: Radiation Oncology

## 2017-01-27 ENCOUNTER — Ambulatory Visit
Admission: RE | Admit: 2017-01-27 | Discharge: 2017-01-27 | Disposition: A | Discharge status: Home / Self Care | Payer: 59 | Source: Ambulatory Visit | Attending: Radiation Oncology | Admitting: Radiation Oncology

## 2017-01-27 DIAGNOSIS — Z510 Encounter for antineoplastic radiation therapy: Secondary | ICD-10-CM | POA: Diagnosis not present

## 2017-01-28 ENCOUNTER — Ambulatory Visit
Admission: RE | Admit: 2017-01-28 | Discharge: 2017-01-28 | Disposition: A | Discharge status: Home / Self Care | Payer: 59 | Source: Ambulatory Visit | Attending: Radiation Oncology | Admitting: Radiation Oncology

## 2017-01-28 DIAGNOSIS — Z510 Encounter for antineoplastic radiation therapy: Secondary | ICD-10-CM | POA: Diagnosis not present

## 2017-01-31 ENCOUNTER — Ambulatory Visit
Admission: RE | Admit: 2017-01-31 | Discharge: 2017-01-31 | Disposition: A | Discharge status: Home / Self Care | Payer: 59 | Source: Ambulatory Visit | Attending: Radiation Oncology | Admitting: Radiation Oncology

## 2017-01-31 DIAGNOSIS — Z510 Encounter for antineoplastic radiation therapy: Secondary | ICD-10-CM | POA: Diagnosis not present

## 2017-02-01 ENCOUNTER — Ambulatory Visit
Admission: RE | Admit: 2017-02-01 | Discharge: 2017-02-01 | Disposition: A | Discharge status: Home / Self Care | Payer: 59 | Source: Ambulatory Visit | Attending: Radiation Oncology | Admitting: Radiation Oncology

## 2017-02-01 DIAGNOSIS — Z510 Encounter for antineoplastic radiation therapy: Secondary | ICD-10-CM | POA: Diagnosis not present

## 2017-02-02 ENCOUNTER — Ambulatory Visit
Admission: RE | Admit: 2017-02-02 | Discharge: 2017-02-02 | Disposition: A | Discharge status: Home / Self Care | Payer: 59 | Source: Ambulatory Visit | Attending: Radiation Oncology | Admitting: Radiation Oncology

## 2017-02-02 DIAGNOSIS — Z510 Encounter for antineoplastic radiation therapy: Secondary | ICD-10-CM | POA: Diagnosis not present

## 2017-02-03 ENCOUNTER — Ambulatory Visit
Admission: RE | Admit: 2017-02-03 | Discharge: 2017-02-03 | Disposition: A | Discharge status: Home / Self Care | Payer: 59 | Source: Ambulatory Visit | Attending: Radiation Oncology | Admitting: Radiation Oncology

## 2017-02-03 ENCOUNTER — Inpatient Hospital Stay: Payer: Managed Care, Other (non HMO) | Attending: Radiation Oncology

## 2017-02-03 ENCOUNTER — Telehealth: Payer: Self-pay | Admitting: *Deleted

## 2017-02-03 ENCOUNTER — Other Ambulatory Visit: Payer: Self-pay | Admitting: Oncology

## 2017-02-03 DIAGNOSIS — C8331 Diffuse large B-cell lymphoma, lymph nodes of head, face, and neck: Secondary | ICD-10-CM | POA: Diagnosis present

## 2017-02-03 DIAGNOSIS — Z510 Encounter for antineoplastic radiation therapy: Secondary | ICD-10-CM | POA: Diagnosis not present

## 2017-02-03 LAB — CBC
HEMATOCRIT: 34.6 % — AB (ref 35.0–47.0)
Hemoglobin: 11.9 g/dL — ABNORMAL LOW (ref 12.0–16.0)
MCH: 31.2 pg (ref 26.0–34.0)
MCHC: 34.4 g/dL (ref 32.0–36.0)
MCV: 90.5 fL (ref 80.0–100.0)
Platelets: 264 10*3/uL (ref 150–440)
RBC: 3.82 MIL/uL (ref 3.80–5.20)
RDW: 16 % — AB (ref 11.5–14.5)
WBC: 6.1 10*3/uL (ref 3.6–11.0)

## 2017-02-03 NOTE — Telephone Encounter (Signed)
Called pt to get port removed.  Her appt 5/25 at 8:30 to [facility] to check in. Her procedure will be 9:30. NPO for 8 hours prior because she will get conscious sedation med. She will need a driver and be there 2-4 hours. She understands and asked about her lab work done by radiology and the hgb is coming up 11.9.

## 2017-02-04 ENCOUNTER — Ambulatory Visit
Admission: RE | Admit: 2017-02-04 | Discharge: 2017-02-04 | Disposition: A | Discharge status: Home / Self Care | Payer: 59 | Source: Ambulatory Visit | Attending: Radiation Oncology | Admitting: Radiation Oncology

## 2017-02-04 DIAGNOSIS — Z510 Encounter for antineoplastic radiation therapy: Secondary | ICD-10-CM | POA: Diagnosis not present

## 2017-02-06 ENCOUNTER — Ambulatory Visit: Payer: 59

## 2017-02-07 ENCOUNTER — Ambulatory Visit: Payer: 59

## 2017-02-08 ENCOUNTER — Ambulatory Visit
Admission: RE | Admit: 2017-02-08 | Discharge: 2017-02-08 | Disposition: A | Discharge status: Home / Self Care | Payer: 59 | Source: Ambulatory Visit | Attending: Radiation Oncology | Admitting: Radiation Oncology

## 2017-02-08 DIAGNOSIS — Z510 Encounter for antineoplastic radiation therapy: Secondary | ICD-10-CM | POA: Diagnosis not present

## 2017-02-09 ENCOUNTER — Ambulatory Visit
Admission: RE | Admit: 2017-02-09 | Discharge: 2017-02-09 | Disposition: A | Discharge status: Home / Self Care | Payer: 59 | Source: Ambulatory Visit | Attending: Radiation Oncology | Admitting: Radiation Oncology

## 2017-02-09 ENCOUNTER — Other Ambulatory Visit: Payer: Self-pay | Admitting: Radiology

## 2017-02-09 DIAGNOSIS — Z510 Encounter for antineoplastic radiation therapy: Secondary | ICD-10-CM | POA: Diagnosis not present

## 2017-02-10 ENCOUNTER — Inpatient Hospital Stay: Payer: Managed Care, Other (non HMO)

## 2017-02-10 ENCOUNTER — Ambulatory Visit
Admission: RE | Admit: 2017-02-10 | Discharge: 2017-02-10 | Disposition: A | Discharge status: Home / Self Care | Payer: 59 | Source: Ambulatory Visit | Attending: Radiation Oncology | Admitting: Radiation Oncology

## 2017-02-10 DIAGNOSIS — C8331 Diffuse large B-cell lymphoma, lymph nodes of head, face, and neck: Secondary | ICD-10-CM

## 2017-02-10 DIAGNOSIS — Z510 Encounter for antineoplastic radiation therapy: Secondary | ICD-10-CM | POA: Diagnosis not present

## 2017-02-10 LAB — CBC
HEMATOCRIT: 34.9 % — AB (ref 35.0–47.0)
Hemoglobin: 12 g/dL (ref 12.0–16.0)
MCH: 31.3 pg (ref 26.0–34.0)
MCHC: 34.5 g/dL (ref 32.0–36.0)
MCV: 90.7 fL (ref 80.0–100.0)
PLATELETS: 268 10*3/uL (ref 150–440)
RBC: 3.85 MIL/uL (ref 3.80–5.20)
RDW: 15.1 % — ABNORMAL HIGH (ref 11.5–14.5)
WBC: 5.7 10*3/uL (ref 3.6–11.0)

## 2017-02-11 ENCOUNTER — Ambulatory Visit
Admission: RE | Admit: 2017-02-11 | Discharge: 2017-02-11 | Disposition: A | Discharge status: Home / Self Care | Payer: Managed Care, Other (non HMO) | Source: Ambulatory Visit | Attending: Oncology | Admitting: Oncology

## 2017-02-11 ENCOUNTER — Ambulatory Visit
Admission: RE | Admit: 2017-02-11 | Discharge: 2017-02-11 | Disposition: A | Discharge status: Home / Self Care | Payer: 59 | Source: Ambulatory Visit | Attending: Radiation Oncology | Admitting: Radiation Oncology

## 2017-02-11 DIAGNOSIS — K219 Gastro-esophageal reflux disease without esophagitis: Secondary | ICD-10-CM | POA: Diagnosis not present

## 2017-02-11 DIAGNOSIS — Z8572 Personal history of non-Hodgkin lymphomas: Secondary | ICD-10-CM | POA: Insufficient documentation

## 2017-02-11 DIAGNOSIS — Z510 Encounter for antineoplastic radiation therapy: Secondary | ICD-10-CM | POA: Diagnosis not present

## 2017-02-11 DIAGNOSIS — Z7952 Long term (current) use of systemic steroids: Secondary | ICD-10-CM | POA: Diagnosis not present

## 2017-02-11 DIAGNOSIS — Z452 Encounter for adjustment and management of vascular access device: Secondary | ICD-10-CM | POA: Insufficient documentation

## 2017-02-11 DIAGNOSIS — C8331 Diffuse large B-cell lymphoma, lymph nodes of head, face, and neck: Secondary | ICD-10-CM

## 2017-02-11 MED ORDER — MIDAZOLAM HCL 5 MG/5ML IJ SOLN
INTRAMUSCULAR | Status: AC | PRN
  Administered 2017-02-11 (×3): 1 mg via INTRAVENOUS

## 2017-02-11 MED ORDER — FENTANYL CITRATE (PF) 100 MCG/2ML IJ SOLN
INTRAMUSCULAR | Status: AC | PRN
  Administered 2017-02-11 (×3): 50 ug via INTRAVENOUS

## 2017-02-11 MED ORDER — SODIUM CHLORIDE 0.9 % IV SOLN
INTRAVENOUS | Status: DC
  Administered 2017-02-11: 09:00:00 via INTRAVENOUS

## 2017-02-11 MED ORDER — CEFAZOLIN SODIUM-DEXTROSE 2-4 GM/100ML-% IV SOLN
2.0000 g | INTRAVENOUS | Status: AC
  Administered 2017-02-11: 2 g via INTRAVENOUS

## 2017-02-11 NOTE — H&P (Signed)
Chief Complaint: Patient was seen in consultation today for removal of portacatheter at the request of Christina [name]  Referring Physician(s): Christina [name]  Patient Status: [facility] - Out-pt  History of Present Illness: Christina [name] is a 56 y.o. female with a hx of low grade diffuse B cell lymphoma.  She has completed therapy and is now in remission.  There are no plans for surveillance imaging.  She no longer needs her portacatheter.  Today she is feeling well.  Denies fever, chills, chest pain, SOB or other symptoms.   Past Medical History:  Diagnosis Date  . B-cell lymphoma ([place])   . Cancer (HCC)    lymphoma, non hodgekins B cell  . GERD (gastroesophageal reflux disease)     Past Surgical History:  Procedure Laterality Date  . ABDOMINAL HYSTERECTOMY    . CESAREAN SECTION     x 2   . ESOPHAGOGASTRODUODENOSCOPY (EGD) WITH PROPOFOL N/A 11/11/2016   Procedure: ESOPHAGOGASTRODUODENOSCOPY (EGD) WITH PROPOFOL;  Surgeon: Jonathon Bellows, MD;  Location: [facility] ENDOSCOPY;  Service: Endoscopy;  Laterality: N/A;  . IR GENERIC HISTORICAL  11/03/2016   IR FLUORO GUIDE PORT INSERTION RIGHT 11/03/2016 Aletta Edouard, MD [facility]-INTERV RAD  . REFRACTIVE SURGERY  09/2015    Allergies: Patient has no known allergies.  Medications: Prior to Admission medications   Medication Sig Start Date End Date Taking? Authorizing Provider  lidocaine-prilocaine (EMLA) cream Apply cream 1 hour before chemotherapy treatment, place a small piece of saran wrap over cream to protect clothing 11/07/16  Yes Sindy Guadeloupe, MD  LORazepam (ATIVAN) 0.5 MG tablet TAKE 1 TABLET BY MOUTH EVERY 6 HOURS AS NEEDED FOR NAUSEA/ VOMITING 11/11/16  Yes [provider]  magic mouthwash SOLN SWISH AND SWALLOW 5ML BY MOUTH 4 TIMES A DAY AS NEEDED 12/06/16  Yes [provider]  nystatin (MYCOSTATIN) 100000 UNIT/ML suspension  12/06/16  Yes [provider]  omeprazole (PRILOSEC) 20 MG capsule Take 1 capsule (20 mg  total) by mouth 2 (two) times daily before a meal. 12/07/16  Yes Wohl, Darren, MD  ondansetron (ZOFRAN) 8 MG tablet TAKE 1 TAB TWICE A DAY AS NEEDED FOR REFRACTORY NAUSEA/ VOMITING. STARTING ON DAY 3 AFTER EACH CHEMO 11/11/16  Yes [provider]  predniSONE (DELTASONE) 50 MG tablet TAKE 2 TABLETS BY MOUTH DAILY WITH BREAKFAST FOR 5 DAYS WITH EACH TREATMENT OF CHEMO RCHOP 12/02/16  Yes [provider]  prochlorperazine (COMPAZINE) 10 MG tablet TAKE 1 TABLET (10 MG TOTAL) BY MOUTH EVERY 6 (SIX) HOURS AS NEEDED FOR NAUSEA OR VOMITING. 11/11/16  Yes [provider]  promethazine (PHENERGAN) 25 MG tablet  12/10/16  Yes [provider]  sucralfate (CARAFATE) 1 g tablet PLACE IN 30 MLS WATER, MIX SWISH AND SWALLOW 30 SECONDS BEFORE MEALS AND AT BEDTIME 12/07/16  Yes [provider]  traMADol (ULTRAM) 50 MG tablet Take 1 tablet (50 mg total) by mouth every 6 (six) hours as needed. 12/15/16 12/15/17 Yes Earleen Newport, MD  Loratadine 10 MG CAPS Take 10 mg by mouth daily.     [provider]     Family History  Problem Relation Age of Onset  . Cancer Mother        breast  . Cancer Father        lung  . Heart disease Brother 23       MI  . Heart disease Paternal Grandfather        MI    Social History  Social History  . Marital status: Single    Spouse name: N/A  . Number of children: N/A  . Years of education: N/A   Social History Main Topics  . Smoking status: Never Smoker  . Smokeless tobacco: Never Used  . Alcohol use No  . Drug use: No  . Sexual activity: Yes   Other Topics Concern  . None   Social History Narrative  . None    ECOG Status: 1 - Symptomatic but completely ambulatory  Review of Systems: A 12 point ROS discussed and pertinent positives are indicated in the HPI above.  All other systems are negative.  Review of Systems  Vital Signs: BP 105/61 (BP Location: Left Arm)   Pulse 66   Temp 98.4 F (36.9 [name])  (Oral)   Resp 16   Ht 5\' 6"  (1.676 m)   Wt 165 lb 5.5 oz (75 kg)   LMP  (LMP Unknown)   SpO2 99%   BMI 26.69 kg/m   Physical Exam  Constitutional: She is oriented to person, place, and time. She appears well-developed and well-nourished.  HENT:  Head: Normocephalic and atraumatic.  Eyes: No scleral icterus.  Cardiovascular: Normal rate and regular rhythm.   Pulmonary/Chest: Effort normal.  Abdominal: Soft. She exhibits no distension. There is no tenderness.  Neurological: She is alert and oriented to person, place, and time.  Skin: Skin is warm and dry.  Psychiatric: She has a normal mood and affect. Her behavior is normal.  Vitals reviewed.   Mallampati Score:     Imaging: No results found.  Labs:  CBC:  Recent Labs  12/23/16 1114 01/07/17 1337 02/03/17 1429 02/10/17 1520  WBC 5.4 9.5 6.1 5.7  HGB 10.9* 10.7* 11.9* 12.0  HCT 31.6* 31.1* 34.6* 34.9*  PLT 311 223 264 268    COAGS:  Recent Labs  10/18/16 0735 11/03/16 1056  INR 0.99 0.96  APTT 31 28    BMP:  Recent Labs  12/09/16 1158 12/15/16 1149 12/23/16 1114 01/07/17 1337  NA 135 140 137 137  K 3.5 3.8 3.8 4.1  CL 104 105 108 104  CO2 27 29 24 28   GLUCOSE 86 115* 122* 107*  BUN 9 15 9 12   CALCIUM 8.0* 9.5 9.1 9.6  CREATININE 0.51 0.88 0.70 0.81  GFRNONAA >60 >60 >60 >60  GFRAA >60 >60 >60 >60    LIVER FUNCTION TESTS:  Recent Labs  12/08/16 1334 12/15/16 1149 12/23/16 1114 01/07/17 1337  BILITOT 0.3 0.2* 0.4 0.3  AST 20 21 24 18   ALT 14 19 19 21   ALKPHOS 81 99 67 117  PROT 6.1* 7.1 6.7 6.9  ALBUMIN 3.3* 4.0 3.8 3.9    TUMOR MARKERS: No results for input(s): AFPTM, CEA, CA199, CHROMGRNA in the last 8760 hours.  Assessment and Plan:  Risks, benefits and alternatives to portacatheter removal were discussed in detail.  Mrs. Christina [name] understands and desires to proceed.     Electronically Signed: Jacqulynn Cadet, MD 02/11/2017, 10:14 AM

## 2017-02-15 ENCOUNTER — Ambulatory Visit
Admission: RE | Admit: 2017-02-15 | Discharge: 2017-02-15 | Disposition: A | Discharge status: Home / Self Care | Payer: 59 | Source: Ambulatory Visit | Attending: Radiation Oncology | Admitting: Radiation Oncology

## 2017-02-15 DIAGNOSIS — Z510 Encounter for antineoplastic radiation therapy: Secondary | ICD-10-CM | POA: Diagnosis not present

## 2017-02-15 HISTORY — PX: IR REMOVAL TUN ACCESS W/ PORT W/O FL MOD SED: IMG2290

## 2017-02-16 ENCOUNTER — Ambulatory Visit
Admission: RE | Admit: 2017-02-16 | Discharge: 2017-02-16 | Disposition: A | Discharge status: Home / Self Care | Payer: 59 | Source: Ambulatory Visit | Attending: Radiation Oncology | Admitting: Radiation Oncology

## 2017-02-16 DIAGNOSIS — Z510 Encounter for antineoplastic radiation therapy: Secondary | ICD-10-CM | POA: Diagnosis not present

## 2017-02-17 ENCOUNTER — Ambulatory Visit
Admission: RE | Admit: 2017-02-17 | Discharge: 2017-02-17 | Disposition: A | Discharge status: Home / Self Care | Payer: 59 | Source: Ambulatory Visit | Attending: Radiation Oncology | Admitting: Radiation Oncology

## 2017-02-17 ENCOUNTER — Inpatient Hospital Stay: Payer: Managed Care, Other (non HMO)

## 2017-02-17 DIAGNOSIS — Z510 Encounter for antineoplastic radiation therapy: Secondary | ICD-10-CM | POA: Diagnosis not present

## 2017-02-17 DIAGNOSIS — C8331 Diffuse large B-cell lymphoma, lymph nodes of head, face, and neck: Secondary | ICD-10-CM

## 2017-02-17 LAB — CBC
HEMATOCRIT: 35.3 % (ref 35.0–47.0)
HEMOGLOBIN: 12.1 g/dL (ref 12.0–16.0)
MCH: 30.8 pg (ref 26.0–34.0)
MCHC: 34.2 g/dL (ref 32.0–36.0)
MCV: 90 fL (ref 80.0–100.0)
Platelets: 241 10*3/uL (ref 150–440)
RBC: 3.93 MIL/uL (ref 3.80–5.20)
RDW: 13.9 % (ref 11.5–14.5)
WBC: 5.4 10*3/uL (ref 3.6–11.0)

## 2017-02-18 ENCOUNTER — Ambulatory Visit
Admission: RE | Admit: 2017-02-18 | Discharge: 2017-02-18 | Disposition: A | Discharge status: Home / Self Care | Payer: 59 | Source: Ambulatory Visit | Attending: Radiation Oncology | Admitting: Radiation Oncology

## 2017-02-18 DIAGNOSIS — Z79899 Other long term (current) drug therapy: Secondary | ICD-10-CM | POA: Diagnosis not present

## 2017-02-18 DIAGNOSIS — C8331 Diffuse large B-cell lymphoma, lymph nodes of head, face, and neck: Secondary | ICD-10-CM | POA: Diagnosis not present

## 2017-02-18 DIAGNOSIS — Z8719 Personal history of other diseases of the digestive system: Secondary | ICD-10-CM | POA: Diagnosis not present

## 2017-02-18 DIAGNOSIS — Z510 Encounter for antineoplastic radiation therapy: Secondary | ICD-10-CM | POA: Diagnosis not present

## 2017-02-18 DIAGNOSIS — K219 Gastro-esophageal reflux disease without esophagitis: Secondary | ICD-10-CM | POA: Diagnosis not present

## 2017-02-18 DIAGNOSIS — Z809 Family history of malignant neoplasm, unspecified: Secondary | ICD-10-CM | POA: Diagnosis not present

## 2017-02-21 ENCOUNTER — Ambulatory Visit
Admission: RE | Admit: 2017-02-21 | Discharge: 2017-02-21 | Disposition: A | Discharge status: Home / Self Care | Payer: 59 | Source: Ambulatory Visit | Attending: Radiation Oncology | Admitting: Radiation Oncology

## 2017-02-21 ENCOUNTER — Encounter: Payer: Self-pay | Admitting: Interventional Radiology

## 2017-02-21 DIAGNOSIS — Z510 Encounter for antineoplastic radiation therapy: Secondary | ICD-10-CM | POA: Diagnosis not present

## 2017-02-22 ENCOUNTER — Ambulatory Visit
Admission: RE | Admit: 2017-02-22 | Discharge: 2017-02-22 | Disposition: A | Discharge status: Home / Self Care | Payer: 59 | Source: Ambulatory Visit | Attending: Radiation Oncology | Admitting: Radiation Oncology

## 2017-02-22 ENCOUNTER — Ambulatory Visit: Payer: 59

## 2017-02-22 DIAGNOSIS — Z510 Encounter for antineoplastic radiation therapy: Secondary | ICD-10-CM | POA: Diagnosis not present

## 2017-02-23 ENCOUNTER — Ambulatory Visit: Payer: 59

## 2017-02-23 ENCOUNTER — Ambulatory Visit
Admission: RE | Admit: 2017-02-23 | Discharge: 2017-02-23 | Disposition: A | Discharge status: Home / Self Care | Payer: 59 | Source: Ambulatory Visit | Attending: Radiation Oncology | Admitting: Radiation Oncology

## 2017-02-23 DIAGNOSIS — Z510 Encounter for antineoplastic radiation therapy: Secondary | ICD-10-CM | POA: Diagnosis not present

## 2017-03-21 ENCOUNTER — Encounter: Payer: Self-pay | Admitting: Radiation Oncology

## 2017-03-22 NOTE — Telephone Encounter (Signed)
done

## 2017-03-30 ENCOUNTER — Encounter: Payer: Self-pay | Admitting: Radiation Oncology

## 2017-03-30 ENCOUNTER — Ambulatory Visit
Admission: RE | Admit: 2017-03-30 | Discharge: 2017-03-30 | Disposition: A | Discharge status: Home / Self Care | Payer: 59 | Source: Ambulatory Visit | Attending: Radiation Oncology | Admitting: Radiation Oncology

## 2017-03-30 VITALS — BP 109/76 | HR 75 | Temp 96.5°F | Wt 155.1 lb

## 2017-03-30 DIAGNOSIS — C8331 Diffuse large B-cell lymphoma, lymph nodes of head, face, and neck: Secondary | ICD-10-CM | POA: Diagnosis not present

## 2017-03-30 DIAGNOSIS — R232 Flushing: Secondary | ICD-10-CM | POA: Insufficient documentation

## 2017-03-30 DIAGNOSIS — Z923 Personal history of irradiation: Secondary | ICD-10-CM | POA: Diagnosis not present

## 2017-03-30 NOTE — Progress Notes (Signed)
Radiation Oncology Follow up Note  Name: Christina [name]   Date:   03/30/2017 MRN:  [id number] DOB: [birth year]    This 56 y.o. female presents to the clinic today for one-month follow-up status post involved field radiation therapy for diffuse large cell lymphoma [facility] type.  REFERRING PROVIDER: [name], NP  HPI: Patient is a 56 year old female now out 1 month having completed involved field radiation therapy. 2 head and neck region for stage I diffuse B-cell lymphoma status post 3 cycles of R CHOP chemotherapy. She had a complete response from chemotherapy by PET CT criteria. She is seen today in routine follow-up and is doing well. She specifically denies fever chills weight loss. She still has some change in her taste and is having some hot flashes at night may be related to her recent discontinuation of hormonal therapy.  COMPLICATIONS OF TREATMENT: none  FOLLOW UP COMPLIANCE: keeps appointments   PHYSICAL EXAM:  BP 109/76   Pulse 75   Temp (!) 96.5 F (35.8 C)   Wt 155 lb 1.5 oz (70.4 kg)   LMP  (LMP Unknown)   BMI 25.03 kg/m  Oral cavity is clear no oral mucosal lesions are identified and no evidence of subject gastric cervical or supra clavicular adenopathy. No other peripheral adenopathy is noted. Well-developed well-nourished patient in NAD. HEENT reveals PERLA, EOMI, discs not visualized.  Oral cavity is clear. No oral mucosal lesions are identified. Neck is clear without evidence of cervical or supraclavicular adenopathy. Lungs are clear to A&P. Cardiac examination is essentially unremarkable with regular rate and rhythm without murmur rub or thrill. Abdomen is benign with no organomegaly or masses noted. Motor sensory and DTR levels are equal and symmetric in the upper and lower extremities. Cranial nerves II through XII are grossly intact. Proprioception is intact. No peripheral adenopathy or edema is identified. No motor or sensory levels are noted. Crude  visual fields are within normal range.  RADIOLOGY RESULTS: No current films for review  PLAN: Present time patient is doing extremely well. She will be seeing medical oncology and the next week. I anticipate at some point a repeat PET CT scan will be ordered and I will review that when it becomes available. Otherwise I've asked to see her back in 4-5 months for follow-up. I've also suggested some vitamin E for her hot flashes oral supplements. Patient is to call with any concerns.  I would like to take this opportunity to thank you for allowing me to participate in the care of your patient.Armstead Peaks., MD

## 2017-04-08 ENCOUNTER — Inpatient Hospital Stay: Payer: 59 | Attending: Oncology | Admitting: Oncology

## 2017-04-08 ENCOUNTER — Encounter: Payer: Self-pay | Admitting: Oncology

## 2017-04-08 ENCOUNTER — Inpatient Hospital Stay: Payer: 59

## 2017-04-08 ENCOUNTER — Other Ambulatory Visit: Payer: Managed Care, Other (non HMO)

## 2017-04-08 VITALS — BP 116/75 | HR 59 | Temp 97.1°F | Resp 18 | Ht 66.0 in | Wt 156.0 lb

## 2017-04-08 DIAGNOSIS — C8331 Diffuse large B-cell lymphoma, lymph nodes of head, face, and neck: Secondary | ICD-10-CM | POA: Diagnosis not present

## 2017-04-08 DIAGNOSIS — Z79899 Other long term (current) drug therapy: Secondary | ICD-10-CM | POA: Insufficient documentation

## 2017-04-08 DIAGNOSIS — Z9221 Personal history of antineoplastic chemotherapy: Secondary | ICD-10-CM | POA: Diagnosis not present

## 2017-04-08 DIAGNOSIS — K219 Gastro-esophageal reflux disease without esophagitis: Secondary | ICD-10-CM | POA: Diagnosis not present

## 2017-04-08 LAB — COMPREHENSIVE METABOLIC PANEL
ALT: 21 U/L (ref 14–54)
ANION GAP: 4 — AB (ref 5–15)
AST: 22 U/L (ref 15–41)
Albumin: 4.3 g/dL (ref 3.5–5.0)
Alkaline Phosphatase: 87 U/L (ref 38–126)
BUN: 12 mg/dL (ref 6–20)
CO2: 30 mmol/L (ref 22–32)
CREATININE: 0.89 mg/dL (ref 0.44–1.00)
Calcium: 9.9 mg/dL (ref 8.9–10.3)
Chloride: 105 mmol/L (ref 101–111)
GFR calc non Af Amer: >60 mL/min (ref >60)
Glucose, Bld: 98 mg/dL (ref 65–99)
Potassium: 4.2 mmol/L (ref 3.5–5.1)
SODIUM: 139 mmol/L (ref 135–145)
Total Bilirubin: 0.4 mg/dL (ref 0.3–1.2)
Total Protein: 7.5 g/dL (ref 6.5–8.1)

## 2017-04-08 LAB — CBC WITH DIFFERENTIAL/PLATELET
Basophils Absolute: 0 10*3/uL (ref 0–0.1)
Basophils Relative: 1 %
EOS ABS: 0.1 10*3/uL (ref 0–0.7)
Eosinophils Relative: 1 %
HCT: 37.9 % (ref 35.0–47.0)
Hemoglobin: 13 g/dL (ref 12.0–16.0)
LYMPHS ABS: 0.8 10*3/uL — AB (ref 1.0–3.6)
LYMPHS PCT: 14 %
MCH: 30.2 pg (ref 26.0–34.0)
MCHC: 34.4 g/dL (ref 32.0–36.0)
MCV: 87.6 fL (ref 80.0–100.0)
MONO ABS: 0.5 10*3/uL (ref 0.2–0.9)
MONOS PCT: 8 %
Neutro Abs: 4.7 10*3/uL (ref 1.4–6.5)
Neutrophils Relative %: 76 %
Platelets: 256 10*3/uL (ref 150–440)
RBC: 4.32 MIL/uL (ref 3.80–5.20)
RDW: 11.7 % (ref 11.5–14.5)
WBC: 6.1 10*3/uL (ref 3.6–11.0)

## 2017-04-08 LAB — LACTATE DEHYDROGENASE: LDH: 132 U/L (ref 98–192)

## 2017-04-08 NOTE — Progress Notes (Signed)
Pt back to her normal self, no c/o.

## 2017-04-08 NOTE — Progress Notes (Signed)
Hematology/Oncology Consult note [facility]  Telephone:([phone] [phone]  Patient Care Team: [name], NP as PCP - General (Nurse Practitioner)   Name of the patient: Christina [name]  [id number]  [birth year]   Date of visit: 04/08/17  Diagnosis- Stage 1 DLBCL [facility] type. Not double hit. s/p 3 cycles of RCHOP  Chief complaint/ Reason for visit- surveillance visit post RCHOP and IFRT  Heme/Onc history: 1. Patient is a 56 year old female who presented to her primary care doctor with symptoms of right neck swelling in January 2018. Her symptoms of an ongoing since December 2017. Patient works at a [employer] (medial in the swelling but the swelling did not go away. She was then referred to ENT.   2. CT of the soft tissue neck with contrast showed malignant right leg lymphadenopathy with heterogeneous enhancement and extracapsular extension occupying both the right level II and level III nodal stations. Individually B abnormal lymph nodes measure up to 4.2 cm in largest dimension and the conglomerulationof abnormal lymph nodes is 5-5.5 cm across. There is a small but asymmetric and conspicuity 7 mm lymph node along the lower right 3-Bstation. Mass effect on the right carotid space from the abnormal nodes but the major vascular structures in the neck at the skull base including the right IJ remained patent.  3. 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 Cells were CD20 positive and BCL 6 and partially dim BCL-2 positive. B cells were negative for C myc (10%). Mom one was positive in 50% of the cells. Ki-67 was 50%.Bone marrow biopsy was negative for lymphoma involvement  4. Patient is otherwise doing well and denies any symptoms of fevers, chills, unintentional weight loss or drenching night sweats. She does not have any significant comorbidities. Currently  reports dry non productive cough for last 1 week  5. PET/CT showed IMPRESSION: 1. FDG avid malignancy is identified in the level 2 and 3 nodal stations of the right cervical lymph node chain, consistent with the patient's known lymphoma. 2. Focal uptake in the gastric cardia is suspicious for malignancy as well. Recommend direct visualization and biopsy as clinically Warranted.  6. EGD showed diffuse moderately erythematous mucosa without bleeding initial nonbleeding gastric ulcer 6 mm which was biopsied. Findings were consistent with H. pylori gastritis and patient was started on antibiotictherapy for the same. Repeat H pylori testing negative  7. EUS also showed H pylori. There were few B cells noted in lamina propria and clonality studies have been ordered. Initial testing for B cell clonality showed no monoclonal B-cell population detected by Randall Hiss heavy chain PCR. Confirmation testing also did not reveal clonal B cells  8. PET/CT scan after cycles of RCHOP showed: IMPRESSION: 1. Complete metabolic response. No metabolically active lymphoma. 2. Uniform low-level marrow hypermetabolism throughout the axial skeleton, compatible with reactive marrow state due to chemotherapy. 3. No residual gastric hypermetabolism  Interval history- taste sensation is still not back completely. Hence appetite not back to baseline. deneis any fatigue, unintentional weight loss or night sweats  ECOG PS- 0 Pain scale- 0  Review of systems- Review of Systems  Constitutional: Negative for chills, fever, malaise/fatigue and weight loss.       Altered taste sensation   HENT: Negative for congestion, ear discharge and nosebleeds.   Eyes: Negative for blurred vision.  Respiratory: Negative for cough, hemoptysis, sputum production, shortness of breath and wheezing.   Cardiovascular:  Negative for chest pain, palpitations, orthopnea and claudication.  Gastrointestinal: Negative for abdominal pain, blood  in stool, constipation, diarrhea, heartburn, melena, nausea and vomiting.  Genitourinary: Negative for dysuria, flank pain, frequency, hematuria and urgency.  Musculoskeletal: Negative for back pain, joint pain and myalgias.  Skin: Negative for rash.  Neurological: Negative for dizziness, tingling, focal weakness, seizures, weakness and headaches.  Endo/Heme/Allergies: Does not bruise/bleed easily.  Psychiatric/Behavioral: Negative for depression and suicidal ideas. The patient does not have insomnia.        No Known Allergies   Past Medical History:  Diagnosis Date  . B-cell lymphoma ([place])   . Cancer (HCC)    lymphoma, non hodgekins B cell  . GERD (gastroesophageal reflux disease)      Past Surgical History:  Procedure Laterality Date  . ABDOMINAL HYSTERECTOMY    . CESAREAN SECTION     x 2   . ESOPHAGOGASTRODUODENOSCOPY (EGD) WITH PROPOFOL N/A 11/11/2016   Procedure: ESOPHAGOGASTRODUODENOSCOPY (EGD) WITH PROPOFOL;  Surgeon: Jonathon Bellows, MD;  Location: [facility] ENDOSCOPY;  Service: Endoscopy;  Laterality: N/A;  . IR GENERIC HISTORICAL  11/03/2016   IR FLUORO GUIDE PORT INSERTION RIGHT 11/03/2016 Aletta Edouard, MD [facility]-INTERV RAD  . IR REMOVAL TUN ACCESS W/ PORT W/O FL MOD SED  02/15/2017  . REFRACTIVE SURGERY  09/2015    Social History   Social History  . Marital status: Single    Spouse name: N/A  . Number of children: N/A  . Years of education: N/A   Occupational History  . Not on file.   Social History Main Topics  . Smoking status: Never Smoker  . Smokeless tobacco: Never Used  . Alcohol use No  . Drug use: No  . Sexual activity: Yes   Other Topics Concern  . Not on file   Social History Narrative  . No narrative on file    Family History  Problem Relation Age of Onset  . Cancer Mother        breast  . Cancer Father        lung  . Heart disease Brother 38       MI  . Heart disease Paternal Grandfather        MI     Current Outpatient  Prescriptions:  .  lidocaine-prilocaine (EMLA) cream, Apply cream 1 hour before chemotherapy treatment, place a small piece of saran wrap over cream to protect clothing, Disp: 30 g, Rfl: 1 .  Loratadine 10 MG CAPS, Take 10 mg by mouth daily. , Disp: , Rfl:  .  LORazepam (ATIVAN) 0.5 MG tablet, TAKE 1 TABLET BY MOUTH EVERY 6 HOURS AS NEEDED FOR NAUSEA/ VOMITING, Disp: , Rfl: 0 .  magic mouthwash SOLN, SWISH AND SWALLOW 5ML BY MOUTH 4 TIMES A DAY AS NEEDED, Disp: , Rfl: 0 .  nystatin (MYCOSTATIN) 100000 UNIT/ML suspension, , Disp: , Rfl:  .  omeprazole (PRILOSEC) 20 MG capsule, Take 1 capsule (20 mg total) by mouth 2 (two) times daily before a meal., Disp: 60 capsule, Rfl: 3 .  ondansetron (ZOFRAN) 8 MG tablet, TAKE 1 TAB TWICE A DAY AS NEEDED FOR REFRACTORY NAUSEA/ VOMITING. STARTING ON DAY 3 AFTER EACH CHEMO, Disp: , Rfl: 1 .  predniSONE (DELTASONE) 50 MG tablet, TAKE 2 TABLETS BY MOUTH DAILY WITH BREAKFAST FOR 5 DAYS WITH EACH TREATMENT OF CHEMO RCHOP, Disp: , Rfl: 3 .  prochlorperazine (COMPAZINE) 10 MG tablet, TAKE 1 TABLET (10 MG TOTAL) BY MOUTH EVERY 6 (SIX) HOURS AS NEEDED  FOR NAUSEA OR VOMITING., Disp: , Rfl: 3 .  promethazine (PHENERGAN) 25 MG tablet, , Disp: , Rfl:  .  sucralfate (CARAFATE) 1 g tablet, PLACE IN 30 MLS WATER, MIX SWISH AND SWALLOW 30 SECONDS BEFORE MEALS AND AT BEDTIME, Disp: , Rfl: 0 .  traMADol (ULTRAM) 50 MG tablet, Take 1 tablet (50 mg total) by mouth every 6 (six) hours as needed., Disp: 20 tablet, Rfl: 0  Physical exam:  Vitals:   04/08/17 1358  BP: 116/75  Pulse: (!) 59  Resp: 18  Temp: (!) 97.1 F (36.2 C)  TempSrc: Tympanic  Weight: 156 lb (70.8 kg)  Height: '5\' 6"'  (1.676 m)   Physical Exam  Constitutional: She is oriented to person, place, and time and well-developed, well-nourished, and in no distress.  HENT:  Head: Normocephalic and atraumatic.  Eyes: Pupils are equal, round, and reactive to light. EOM are normal.  Neck: Normal range of motion.    Cardiovascular: Normal rate, regular rhythm and normal heart sounds.   Pulmonary/Chest: Effort normal and breath sounds normal.  Abdominal: Soft. Bowel sounds are normal.  Lymphadenopathy:  No palpable cervical, supraclavicular, axillary or inguinal adenopathy  Neurological: She is alert and oriented to person, place, and time.  Skin: Skin is warm and dry.     CMP Latest Ref Rng & Units 04/08/2017  Glucose 65 - 99 mg/dL 98  BUN 6 - 20 mg/dL 12  Creatinine 0.44 - 1.00 mg/dL 0.89  Sodium 135 - 145 mmol/L 139  Potassium 3.5 - 5.1 mmol/L 4.2  Chloride 101 - 111 mmol/L 105  CO2 22 - 32 mmol/L 30  Calcium 8.9 - 10.3 mg/dL 9.9  Total Protein 6.5 - 8.1 g/dL 7.5  Total Bilirubin 0.3 - 1.2 mg/dL 0.4  Alkaline Phos 38 - 126 U/L 87  AST 15 - 41 U/L 22  ALT 14 - 54 U/L 21   CBC Latest Ref Rng & Units 04/08/2017  WBC 3.6 - 11.0 K/uL 6.1  Hemoglobin 12.0 - 16.0 g/dL 13.0  Hematocrit 35.0 - 47.0 % 37.9  Platelets 150 - 440 K/uL 256     Assessment and plan- Patient is a 56 y.o. female with h/o DLBCL stage 1 s/p 3 cycles of RCHOP and IFRT  Clinically she is doing well and there is no evidence of recurrence on todays exam. Labs WNL. I will see her back in 68month. chceck cbc, cmp and LDH at that time. No indication for scans unless clinically indicated   Visit Diagnosis 1. 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HCC)      Dr. ARanda Evens MD, MPH [place]at [facility]ager- [phone]/20/2018 2:17 PM

## 2017-05-16 ENCOUNTER — Other Ambulatory Visit: Payer: Self-pay | Admitting: *Deleted

## 2017-05-16 ENCOUNTER — Telehealth: Payer: Self-pay | Admitting: *Deleted

## 2017-05-16 DIAGNOSIS — R300 Dysuria: Secondary | ICD-10-CM

## 2017-05-16 NOTE — Telephone Encounter (Signed)
Pt called stating she was put on neurontin for hot flashes and she developed UTI. She called GYN and they can't see her to Wed. Afternoon and she wanted to know if she could have atb called in.  I checked with Dr. Janese Banks and she said that she can come in and do ua and if it is positive she will call in at.  Pt willing to come tom in [place] 12 noon.  I put message into staff to make appt for tom in [place] as lab only. Pt agreeable

## 2017-05-17 ENCOUNTER — Other Ambulatory Visit: Payer: Self-pay | Admitting: *Deleted

## 2017-05-17 ENCOUNTER — Telehealth: Payer: Self-pay | Admitting: *Deleted

## 2017-05-17 ENCOUNTER — Inpatient Hospital Stay: Payer: 59 | Attending: Oncology

## 2017-05-17 ENCOUNTER — Other Ambulatory Visit
Admission: RE | Admit: 2017-05-17 | Discharge: 2017-05-17 | Disposition: A | Discharge status: Home / Self Care | Payer: 59 | Source: Ambulatory Visit | Attending: Oncology | Admitting: Oncology

## 2017-05-17 DIAGNOSIS — R300 Dysuria: Secondary | ICD-10-CM | POA: Diagnosis present

## 2017-05-17 LAB — URINALYSIS, COMPLETE (UACMP) WITH MICROSCOPIC
Glucose, UA: NEGATIVE mg/dL
Hgb urine dipstick: NEGATIVE
KETONES UR: NEGATIVE mg/dL
NITRITE: POSITIVE — AB
Protein, ur: 30 mg/dL — AB
Specific Gravity, Urine: >1.03 — ABNORMAL HIGH (ref 1.005–1.030)
pH: 5 (ref 5.0–8.0)

## 2017-05-17 MED ORDER — FLUCONAZOLE 100 MG PO TABS: 200.0000 mg | ORAL_TABLET | Freq: Once | ORAL | 0 refills | Status: AC

## 2017-05-17 MED ORDER — SULFAMETHOXAZOLE-TRIMETHOPRIM 800-160 MG PO TABS: 1.0000 | ORAL_TABLET | Freq: Two times a day (BID) | ORAL | 0 refills | Status: DC

## 2017-05-17 NOTE — Telephone Encounter (Signed)
Called pt back and said that she can have dilfucan x 1 day. It was sent into her pharmacy

## 2017-05-17 NOTE — Telephone Encounter (Signed)
Called pt to let her know that she does have UTI and we will call in atb for her and bid for 3 days.  The pt asked to see if she can have the pill she takes after she has atb because it always causes yeast infection from atb.  I told her that I will ask md and call pt back,

## 2017-05-19 LAB — URINE CULTURE: CULTURE: NO GROWTH

## 2017-07-08 ENCOUNTER — Ambulatory Visit: Payer: 59 | Admitting: Oncology

## 2017-07-08 ENCOUNTER — Other Ambulatory Visit: Payer: 59

## 2017-07-15 ENCOUNTER — Encounter: Payer: Self-pay | Admitting: Oncology

## 2017-07-15 ENCOUNTER — Other Ambulatory Visit: Payer: Self-pay

## 2017-07-15 ENCOUNTER — Telehealth: Payer: Self-pay

## 2017-07-15 ENCOUNTER — Inpatient Hospital Stay (HOSPITAL_BASED_OUTPATIENT_CLINIC_OR_DEPARTMENT_OTHER): Payer: 59 | Admitting: Oncology

## 2017-07-15 ENCOUNTER — Inpatient Hospital Stay: Payer: 59 | Attending: Oncology

## 2017-07-15 VITALS — BP 128/88 | HR 50 | Temp 98.1°F | Resp 14 | Wt 158.0 lb

## 2017-07-15 DIAGNOSIS — Z801 Family history of malignant neoplasm of trachea, bronchus and lung: Secondary | ICD-10-CM | POA: Diagnosis not present

## 2017-07-15 DIAGNOSIS — Z1211 Encounter for screening for malignant neoplasm of colon: Secondary | ICD-10-CM

## 2017-07-15 DIAGNOSIS — R232 Flushing: Secondary | ICD-10-CM

## 2017-07-15 DIAGNOSIS — Z9221 Personal history of antineoplastic chemotherapy: Secondary | ICD-10-CM

## 2017-07-15 DIAGNOSIS — R413 Other amnesia: Secondary | ICD-10-CM | POA: Insufficient documentation

## 2017-07-15 DIAGNOSIS — Z803 Family history of malignant neoplasm of breast: Secondary | ICD-10-CM | POA: Diagnosis not present

## 2017-07-15 DIAGNOSIS — Z79899 Other long term (current) drug therapy: Secondary | ICD-10-CM

## 2017-07-15 DIAGNOSIS — C8331 Diffuse large B-cell lymphoma, lymph nodes of head, face, and neck: Secondary | ICD-10-CM | POA: Diagnosis not present

## 2017-07-15 DIAGNOSIS — Z923 Personal history of irradiation: Secondary | ICD-10-CM | POA: Diagnosis not present

## 2017-07-15 DIAGNOSIS — K219 Gastro-esophageal reflux disease without esophagitis: Secondary | ICD-10-CM | POA: Diagnosis not present

## 2017-07-15 DIAGNOSIS — Z8719 Personal history of other diseases of the digestive system: Secondary | ICD-10-CM

## 2017-07-15 DIAGNOSIS — Z8579 Personal history of other malignant neoplasms of lymphoid, hematopoietic and related tissues: Secondary | ICD-10-CM

## 2017-07-15 DIAGNOSIS — Z08 Encounter for follow-up examination after completed treatment for malignant neoplasm: Secondary | ICD-10-CM

## 2017-07-15 DIAGNOSIS — Z1212 Encounter for screening for malignant neoplasm of rectum: Principal | ICD-10-CM

## 2017-07-15 LAB — COMPREHENSIVE METABOLIC PANEL
ALBUMIN: 4.2 g/dL (ref 3.5–5.0)
ALT: 27 U/L (ref 14–54)
ANION GAP: 8 (ref 5–15)
AST: 23 U/L (ref 15–41)
Alkaline Phosphatase: 98 U/L (ref 38–126)
BILIRUBIN TOTAL: 0.5 mg/dL (ref 0.3–1.2)
BUN: 14 mg/dL (ref 6–20)
CO2: 27 mmol/L (ref 22–32)
Calcium: 9.6 mg/dL (ref 8.9–10.3)
Chloride: 103 mmol/L (ref 101–111)
Creatinine, Ser: 0.78 mg/dL (ref 0.44–1.00)
GFR calc Af Amer: >60 mL/min (ref >60)
GFR calc non Af Amer: >60 mL/min (ref >60)
Glucose, Bld: 102 mg/dL — ABNORMAL HIGH (ref 65–99)
POTASSIUM: 4.3 mmol/L (ref 3.5–5.1)
Sodium: 138 mmol/L (ref 135–145)
TOTAL PROTEIN: 7.4 g/dL (ref 6.5–8.1)

## 2017-07-15 LAB — CBC WITH DIFFERENTIAL/PLATELET
BASOS PCT: 0 %
Basophils Absolute: 0 10*3/uL (ref 0–0.1)
EOS ABS: 0 10*3/uL (ref 0–0.7)
EOS PCT: 1 %
HCT: 37.1 % (ref 35.0–47.0)
Hemoglobin: 12.5 g/dL (ref 12.0–16.0)
Lymphocytes Relative: 13 %
Lymphs Abs: 0.7 10*3/uL — ABNORMAL LOW (ref 1.0–3.6)
MCH: 30 pg (ref 26.0–34.0)
MCHC: 33.7 g/dL (ref 32.0–36.0)
MCV: 89.2 fL (ref 80.0–100.0)
MONO ABS: 0.5 10*3/uL (ref 0.2–0.9)
MONOS PCT: 9 %
Neutro Abs: 4.2 10*3/uL (ref 1.4–6.5)
Neutrophils Relative %: 77 %
PLATELETS: 247 10*3/uL (ref 150–440)
RBC: 4.15 MIL/uL (ref 3.80–5.20)
RDW: 13.4 % (ref 11.5–14.5)
WBC: 5.5 10*3/uL (ref 3.6–11.0)

## 2017-07-15 LAB — LACTATE DEHYDROGENASE: LDH: 121 U/L (ref 98–192)

## 2017-07-15 NOTE — Progress Notes (Signed)
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 Patient is a 56 year old female who presented to her primary care doctor with symptoms of right neck swelling in January 2018. Her symptoms of an ongoing since December 2017. Patient works at a [employer] (medial in the swelling but the swelling did not go away. She was then referred to ENT.   2. CT of the soft tissue neck with contrast showed malignant right leg lymphadenopathy with heterogeneous enhancement and extracapsular extension occupying both the right level II and level III nodal stations. Individually B abnormal lymph nodes measure up to 4.2 cm in largest dimension and the conglomerulationof abnormal lymph nodes is 5-5.5 cm across. There is a small but asymmetric and conspicuity 7 mm lymph node along the lower right 3-Bstation. Mass effect on the right carotid space from the abnormal nodes but the major vascular structures in the neck at the skull base including the right IJ remained patent.  3. 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 Cells were CD20 positive and BCL 6 and partially dim BCL-2 positive. B cells were negative for C myc (10%). Mom one was positive in 50% of the cells. Ki-67 was 50%.Bone marrow biopsy was negative for lymphoma involvement  4.  PET/CT showed IMPRESSION: 1. FDG avid malignancy is identified in the level 2 and 3 nodal stations of the right cervical lymph node chain, consistent with the patient's known lymphoma. 2.  Focal uptake in the gastric cardia is suspicious for malignancy as well. Recommend direct visualization and biopsy as clinically Warranted.  5. EGD showed diffuse moderately erythematous mucosa without bleeding initial nonbleeding gastric ulcer 6 mm which was biopsied. Findings were consistent with H. pylori gastritis and patient was started on antibiotictherapy for the same. Repeat H pylori testing negative  6. EUS also showed H pylori. There were few B cells noted in lamina propria and clonality studies have been ordered. Initial testing for B cell clonality showed no monoclonal B-cell population detected by Randall Hiss heavy chain PCR. Confirmation testing also did not reveal clonal B cells  7. PET/CT scan after cycles of RCHOP showed: IMPRESSION: 1. Complete metabolic response. No metabolically active lymphoma. 2. Uniform low-level marrow hypermetabolism throughout the axial skeleton, compatible with reactive marrow state due to chemotherapy. 3. No residual gastric hypermetabolism   Interval history- Her appetite is good. She denies any unintentional weight loss. She works almost 80 hours a week and does feel tired because of that. Reports having trouble remembering things since her chemotherapy. She does report hot flashes which are controlled with neurontin  ECOG PS- 0 Pain scale- 0   Review of systems- Review of Systems  Constitutional: Negative for chills, fever, malaise/fatigue and weight loss.  HENT: Negative for congestion, ear discharge and nosebleeds.   Eyes: Negative for blurred vision.  Respiratory: Negative for cough, hemoptysis, sputum production, shortness of breath and wheezing.   Cardiovascular: Negative for chest pain, palpitations, orthopnea and claudication.  Gastrointestinal: Negative for abdominal pain, blood in stool, constipation, diarrhea, heartburn, melena, nausea and vomiting.  Genitourinary:  Negative for dysuria, flank pain, frequency, hematuria and  urgency.  Musculoskeletal: Negative for back pain, joint pain and myalgias.  Skin: Negative for rash.  Neurological: Negative for dizziness, tingling, focal weakness, seizures, weakness and headaches.  Endo/Heme/Allergies: Does not bruise/bleed easily.       Hot flashes  Psychiatric/Behavioral: Negative for depression and suicidal ideas. The patient does not have insomnia.        No Known Allergies   Past Medical History:  Diagnosis Date  . B-cell lymphoma ([place])   . Cancer (HCC)    lymphoma, non hodgekins B cell  . GERD (gastroesophageal reflux disease)      Past Surgical History:  Procedure Laterality Date  . ABDOMINAL HYSTERECTOMY    . CESAREAN SECTION     x 2   . ESOPHAGOGASTRODUODENOSCOPY (EGD) WITH PROPOFOL N/A 11/11/2016   Procedure: ESOPHAGOGASTRODUODENOSCOPY (EGD) WITH PROPOFOL;  Surgeon: Jonathon Bellows, MD;  Location: [facility] ENDOSCOPY;  Service: Endoscopy;  Laterality: N/A;  . IR GENERIC HISTORICAL  11/03/2016   IR FLUORO GUIDE PORT INSERTION RIGHT 11/03/2016 Aletta Edouard, MD [facility]-INTERV RAD  . IR REMOVAL TUN ACCESS W/ PORT W/O FL MOD SED  02/15/2017  . REFRACTIVE SURGERY  09/2015    Social History   Social History  . Marital status: Single    Spouse name: N/A  . Number of children: N/A  . Years of education: N/A   Occupational History  . Not on file.   Social History Main Topics  . Smoking status: Never Smoker  . Smokeless tobacco: Never Used  . Alcohol use No  . Drug use: No  . Sexual activity: Yes   Other Topics Concern  . Not on file   Social History Narrative  . No narrative on file    Family History  Problem Relation Age of Onset  . Cancer Mother        breast  . Cancer Father        lung  . Heart disease Brother 6       MI  . Heart disease Paternal Grandfather        MI     Current Outpatient Prescriptions:  .  gabapentin (NEURONTIN) 100 MG capsule, Take 100 mg by mouth at bedtime., Disp: , Rfl:   Physical exam:  Vitals:    07/15/17 1031  BP: 128/88  Pulse: (!) 50  Resp: 14  Temp: 98.1 F (36.7 C)  TempSrc: Tympanic  Weight: 158 lb (71.7 kg)   Physical Exam  Constitutional: She is oriented to person, place, and time and well-developed, well-nourished, and in no distress.  HENT:  Head: Normocephalic and atraumatic.  Eyes: Pupils are equal, round, and reactive to light. EOM are normal.  Neck: Normal range of motion.  Cardiovascular: Normal rate, regular rhythm and normal heart sounds.   Pulmonary/Chest: Effort normal and breath sounds normal.  Abdominal: Soft. Bowel sounds are normal.  No palpable splenomegaly  Lymphadenopathy:  No palpable supraclavicular, cervical, sub mandibular, axillary or inguinal adenopathy  Neurological: She is alert and oriented to person, place, and time.  Skin: Skin is warm and dry.     CMP Latest Ref Rng & Units 07/15/2017  Glucose 65 - 99 mg/dL 102(H)  BUN 6 - 20 mg/dL 14  Creatinine 0.44 - 1.00 mg/dL 0.78  Sodium 135 - 145 mmol/L 138  Potassium 3.5 - 5.1 mmol/L 4.3  Chloride 101 - 111 mmol/L 103  CO2 22 - 32 mmol/L 27  Calcium 8.9 - 10.3 mg/dL  9.6  Total Protein 6.5 - 8.1 g/dL 7.4  Total Bilirubin 0.3 - 1.2 mg/dL 0.5  Alkaline Phos 38 - 126 U/L 98  AST 15 - 41 U/L 23  ALT 14 - 54 U/L 27   CBC Latest Ref Rng & Units 07/15/2017  WBC 3.6 - 11.0 K/uL 5.5  Hemoglobin 12.0 - 16.0 g/dL 12.5  Hematocrit 35.0 - 47.0 % 37.1  Platelets 150 - 440 K/uL 247     Assessment and plan- Patient is a 56 y.o. female with h/o DLBCL stage 1 s/p 3 cycles of RCHOP and IFRT  Clinically she is doing well and there is no evidence of recurrence on todays exam. CBC, CMP and LDH are normal. I will see her back in 3 months. chceck cbc, cmp and LDH at that time. Reviewed NCCN guidelines with her again and imaging not indicated unless there are concerning signs and symptoms clinically  She is due for screening colonoscopy. She has seen Dr. Vicente Males in the past. I will refer her back to him     Visit Diagnosis 1. 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 Encounter for follow-up surveillance of diffuse large B-cell lymphoma      Dr. Randa Evens, MD, MPH [facility] Pager- [phone] 07/15/2017 11:27 AM

## 2017-07-15 NOTE — Telephone Encounter (Signed)
Gastroenterology Pre-Procedure Review  Request Date: 11/16 Requesting Physician: Dr. Vicente Males  PATIENT REVIEW QUESTIONS: The patient responded to the following health history questions as indicated:    1. Are you having any GI issues? no 2. Do you have a personal history of Polyps? no 3. Do you have a family history of Colon Cancer or Polyps? no 4. Diabetes Mellitus? no 5. Joint replacements in the past 12 months?no 6. Major health problems in the past 3 months?no 7. Any artificial heart valves, MVP, or defibrillator?no    MEDICATIONS & ALLERGIES:    Patient reports the following regarding taking any anticoagulation/antiplatelet therapy:   Plavix, Coumadin, Eliquis, Xarelto, Lovenox, Pradaxa, Brilinta, or Effient? no Aspirin? no  Patient confirms/reports the following medications:  Current Outpatient Prescriptions  Medication Sig Dispense Refill  . allopurinol (ZYLOPRIM) 300 MG tablet Take by mouth.    . docusate sodium (COLACE) 100 MG capsule Take by mouth.    . gabapentin (NEURONTIN) 100 MG capsule Take 100 mg by mouth at bedtime.    . gabapentin (NEURONTIN) 300 MG capsule Take 300 mg by mouth at bedtime.  1  . lidocaine-prilocaine (EMLA) cream as needed.    . Loratadine (CLARITIN) 10 MG CAPS Take by mouth.    . ondansetron (ZOFRAN) 8 MG tablet Take by mouth.    . predniSONE (DELTASONE) 50 MG tablet Take by mouth.    . prochlorperazine (COMPAZINE) 10 MG tablet Take by mouth.    . sulfamethoxazole-trimethoprim (BACTRIM DS,SEPTRA DS) 800-160 MG tablet Take 1 tablet by mouth 2 (two) times daily.  0   No current facility-administered medications for this visit.     Patient confirms/reports the following allergies:  No Known Allergies  No orders of the defined types were placed in this encounter.   AUTHORIZATION INFORMATION Primary Insurance: 1D#: Group #:  Secondary Insurance: 1D#: Group #:  SCHEDULE INFORMATION: Date: 11/16 Time: Location: [place]

## 2017-07-15 NOTE — Progress Notes (Signed)
Patient here for follow up with labs today. She states that she is feeling well and denies having any pain. She has started taking Neurontin 100 mg at bedtime for hot flashes, but forgets to take it most of the time. She does not take the Flu Vaccine.

## 2017-07-29 ENCOUNTER — Telehealth: Payer: Self-pay | Admitting: Gastroenterology

## 2017-07-29 NOTE — Telephone Encounter (Signed)
Patient needs to r/s her procedure. 

## 2017-09-28 ENCOUNTER — Other Ambulatory Visit: Payer: Self-pay

## 2017-09-28 ENCOUNTER — Ambulatory Visit: Payer: 59 | Admitting: Anesthesiology

## 2017-09-28 ENCOUNTER — Ambulatory Visit
Admission: RE | Admit: 2017-09-28 | Discharge: 2017-09-28 | Disposition: A | Discharge status: Home / Self Care | Payer: 59 | Source: Ambulatory Visit | Attending: Gastroenterology | Admitting: Gastroenterology

## 2017-09-28 ENCOUNTER — Encounter: Payer: Self-pay | Admitting: Student

## 2017-09-28 ENCOUNTER — Encounter
Admission: RE | Disposition: A | Discharge status: Home / Self Care | Payer: Self-pay | Source: Ambulatory Visit | Attending: Gastroenterology

## 2017-09-28 DIAGNOSIS — Z803 Family history of malignant neoplasm of breast: Secondary | ICD-10-CM | POA: Diagnosis not present

## 2017-09-28 DIAGNOSIS — Z1212 Encounter for screening for malignant neoplasm of rectum: Secondary | ICD-10-CM

## 2017-09-28 DIAGNOSIS — C8510 Unspecified B-cell lymphoma, unspecified site: Secondary | ICD-10-CM | POA: Diagnosis not present

## 2017-09-28 DIAGNOSIS — Z1211 Encounter for screening for malignant neoplasm of colon: Secondary | ICD-10-CM | POA: Insufficient documentation

## 2017-09-28 DIAGNOSIS — K219 Gastro-esophageal reflux disease without esophagitis: Secondary | ICD-10-CM | POA: Diagnosis not present

## 2017-09-28 DIAGNOSIS — Z8249 Family history of ischemic heart disease and other diseases of the circulatory system: Secondary | ICD-10-CM | POA: Diagnosis not present

## 2017-09-28 DIAGNOSIS — Z90710 Acquired absence of both cervix and uterus: Secondary | ICD-10-CM | POA: Insufficient documentation

## 2017-09-28 DIAGNOSIS — Z79899 Other long term (current) drug therapy: Secondary | ICD-10-CM | POA: Diagnosis not present

## 2017-09-28 DIAGNOSIS — Z801 Family history of malignant neoplasm of trachea, bronchus and lung: Secondary | ICD-10-CM | POA: Insufficient documentation

## 2017-09-28 HISTORY — PX: COLONOSCOPY WITH PROPOFOL: SHX5780

## 2017-09-28 SURGERY — COLONOSCOPY WITH PROPOFOL
Anesthesia: General

## 2017-09-28 MED ORDER — PROPOFOL 10 MG/ML IV BOLUS
INTRAVENOUS | Status: DC | PRN
  Administered 2017-09-28: 70 mg via INTRAVENOUS
  Administered 2017-09-28: 10 mg via INTRAVENOUS

## 2017-09-28 MED ORDER — PROPOFOL 500 MG/50ML IV EMUL
INTRAVENOUS | Status: AC
  Filled 2017-09-28: qty 50

## 2017-09-28 MED ORDER — PROPOFOL 500 MG/50ML IV EMUL
INTRAVENOUS | Status: DC | PRN
  Administered 2017-09-28: 150 ug/kg/min via INTRAVENOUS

## 2017-09-28 MED ORDER — EPHEDRINE SULFATE 50 MG/ML IJ SOLN
INTRAMUSCULAR | Status: DC | PRN
  Administered 2017-09-28: 10 mg via INTRAVENOUS

## 2017-09-28 MED ORDER — SODIUM CHLORIDE 0.9 % IV SOLN
INTRAVENOUS | Status: DC | PRN
  Administered 2017-09-28: 13:00:00 via INTRAVENOUS

## 2017-09-28 MED ORDER — SODIUM CHLORIDE 0.9 % IV SOLN: INTRAVENOUS | Status: DC

## 2017-09-28 NOTE — H&P (Signed)
Christina Bellows, MD [address] [place], [place], [place], [address] [address], [place], [place], [place], [address] Phone: [phone]  Fax: [phone]  Primary Care Physician:  [name], NP   Pre-Procedure History & Physical: HPI:  Christina [name] is a 57 y.o. female is here for an colonoscopy.   Past Medical History:  Diagnosis Date  . B-cell lymphoma ([place])   . Cancer (HCC)    lymphoma, non hodgekins B cell  . GERD (gastroesophageal reflux disease)     Past Surgical History:  Procedure Laterality Date  . ABDOMINAL HYSTERECTOMY    . CESAREAN SECTION     x 2   . ESOPHAGOGASTRODUODENOSCOPY (EGD) WITH PROPOFOL N/A 11/11/2016   Procedure: ESOPHAGOGASTRODUODENOSCOPY (EGD) WITH PROPOFOL;  Surgeon: Christina Bellows, MD;  Location: [facility] ENDOSCOPY;  Service: Endoscopy;  Laterality: N/A;  . IR GENERIC HISTORICAL  11/03/2016   IR FLUORO GUIDE PORT INSERTION RIGHT 11/03/2016 Aletta Edouard, MD [facility]-INTERV RAD  . IR REMOVAL TUN ACCESS W/ PORT W/O FL MOD SED  02/15/2017  . REFRACTIVE SURGERY  09/2015    Prior to Admission medications   Medication Sig Start Date End Date Taking? Authorizing Provider  lidocaine-prilocaine (EMLA) cream as needed. 11/07/16  Yes [provider]  allopurinol (ZYLOPRIM) 300 MG tablet Take by mouth.    [provider]  docusate sodium (COLACE) 100 MG capsule Take by mouth.    [provider]  gabapentin (NEURONTIN) 100 MG capsule Take 100 mg by mouth at bedtime.    [provider]  gabapentin (NEURONTIN) 300 MG capsule Take 300 mg by mouth at bedtime. 05/04/17   [provider]  Loratadine (CLARITIN) 10 MG CAPS Take by mouth.    [provider]  ondansetron (ZOFRAN) 8 MG tablet Take by mouth.    [provider]  predniSONE (DELTASONE) 50 MG tablet Take by mouth.    [provider]  prochlorperazine (COMPAZINE) 10 MG tablet Take by mouth.    [provider]    sulfamethoxazole-trimethoprim (BACTRIM DS,SEPTRA DS) 800-160 MG tablet Take 1 tablet by mouth 2 (two) times daily. 05/17/17   [provider]    Allergies as of 07/15/2017  . (No Known Allergies)    Family History  Problem Relation Age of Onset  . Cancer Mother        breast  . Cancer Father        lung  . Heart disease Brother 71       MI  . Heart disease Paternal Grandfather        MI    Social History   Socioeconomic History  . Marital status: Single    Spouse name: Not on file  . Number of children: Not on file  . Years of education: Not on file  . Highest education level: Not on file  Social Needs  . Financial resource strain: Not on file  . Food insecurity - worry: Not on file  . Food insecurity - inability: Not on file  . Transportation needs - medical: Not on file  . Transportation needs - non-medical: Not on file  Occupational History  . Not on file  Tobacco Use  . Smoking status: Never Smoker  . Smokeless tobacco: Never Used  Substance and Sexual Activity  . Alcohol use: No  . Drug use: No  . Sexual activity: Yes  Other Topics Concern  . Not on file  Social History Narrative  . Not on  file    Review of Systems: See HPI, otherwise negative ROS  Physical Exam: BP 104/74   Pulse 65   Temp 98.9 F (37.2 C) (Tympanic)   Ht 5\' 6"  (1.676 m)   Wt 157 lb (71.2 kg)   LMP  (LMP Unknown)   SpO2 99%   BMI 25.34 kg/m  General:   Alert,  pleasant and cooperative in NAD Head:  Normocephalic and atraumatic. Neck:  Supple; no masses or thyromegaly. Lungs:  Clear throughout to auscultation, normal respiratory effort.    Heart:  +S1, +S2, Regular rate and rhythm, No edema. Abdomen:  Soft, nontender and nondistended. Normal bowel sounds, without guarding, and without rebound.   Neurologic:  Alert and  oriented x4;  grossly normal neurologically.  Impression/Plan: [name] is here for an colonoscopy to be performed for Screening colonoscopy  average risk   Risks, benefits, limitations, and alternatives regarding  colonoscopy have been reviewed with the patient.  Questions have been answered.  All parties agreeable.   Christina Bellows, MD  09/28/2017, 1:15 PM

## 2017-09-28 NOTE — Anesthesia Postprocedure Evaluation (Signed)
Anesthesia Post Note  Patient: Christina [name]  Procedure(s) Performed: COLONOSCOPY WITH PROPOFOL (N/A )  Patient location during evaluation: Endoscopy Anesthesia Type: General Level of consciousness: awake and alert and oriented Pain management: pain level controlled Vital Signs Assessment: post-procedure vital signs reviewed and stable Respiratory status: spontaneous breathing, nonlabored ventilation and respiratory function stable Cardiovascular status: blood pressure returned to baseline and stable Postop Assessment: no signs of nausea or vomiting Anesthetic complications: no     Last Vitals:  Vitals:   09/28/17 1420 09/28/17 1430  BP: (!) 99/56 97/66  Pulse: 63 (!) 58  Resp: 18 14  Temp:    SpO2: 100% 100%    Last Pain:  Vitals:   09/28/17 1430  TempSrc:   PainSc: 0-No pain                 [name]

## 2017-09-28 NOTE — Op Note (Signed)
[facility] Gastroenterology Patient Name: Christina [name] Procedure Date: 09/28/2017 12:48 PM MRN: [id number] Account #: [id number] Date of Birth: [birth year] Admit Type: Outpatient Age: 57 Room: [facility] ENDO ROOM 1 Gender: Female Note Status: Finalized Procedure:            Colonoscopy Indications:          Screening for colorectal malignant neoplasm Providers:            Jonathon Bellows MD, MD Referring MD:         [name] (Referring MD) Medicines:            Monitored Anesthesia Care Complications:        No immediate complications. Procedure:            Pre-Anesthesia Assessment:                       - Prior to the procedure, a History and Physical was                        performed, and patient medications, allergies and                        sensitivities were reviewed. The patient's tolerance of                        previous anesthesia was reviewed.                       - The risks and benefits of the procedure and the                        sedation options and risks were discussed with the                        patient. All questions were answered and informed                        consent was obtained.                       - ASA Grade Assessment: II - A patient with mild                        systemic disease.                       After obtaining informed consent, the colonoscope was                        passed under direct vision. Throughout the procedure,                        the patient's blood pressure, pulse, and oxygen                        saturations were monitored continuously. The                        Colonoscope was introduced through the anus and  advanced to the the cecum, identified by the                        appendiceal orifice, IC valve and transillumination.                        The colonoscopy was performed with ease. The patient                        tolerated the procedure well. The quality of  the bowel                        preparation was good. Findings:      The entire examined colon appeared normal on direct and retroflexion       views. Impression:           - The entire examined colon is normal on direct and                        retroflexion views.                       - No specimens collected. Recommendation:       - Discharge patient to home (with escort).                       - Resume previous diet.                       - Continue present medications.                       - Repeat colonoscopy in 10 years for screening purposes. Procedure Code(s):    --- Professional ---                       [phone], Colonoscopy, flexible; diagnostic, including                        collection of specimen(s) by brushing or washing, when                        performed (separate procedure) Diagnosis Code(s):    --- Professional ---                       Z12.11, Encounter for screening for malignant neoplasm                        of colon CPT copyright 2016 American Medical Association. All rights reserved. The codes documented in this report are preliminary and upon coder review may  be revised to meet current compliance requirements. Jonathon Bellows, MD Jonathon Bellows MD, MD 09/28/2017 2:05:52 PM This report has been signed electronically. Number of Addenda: 0 Note Initiated On: 09/28/2017 12:48 PM Scope Withdrawal Time: 0 hours 16 minutes 24 seconds  Total Procedure Duration: 0 hours 18 minutes 50 seconds       [facility]

## 2017-09-28 NOTE — Transfer of Care (Signed)
Immediate Anesthesia Transfer of Care Note  Patient: Christina [name]  Procedure(s) Performed: COLONOSCOPY WITH PROPOFOL (N/A )  Patient Location: PACU  Anesthesia Type:General  Level of Consciousness: awake and sedated  Airway & Oxygen Therapy: Patient Spontanous Breathing and Patient connected to nasal cannula oxygen  Post-op Assessment: Report given to RN and Post -op Vital signs reviewed and stable  Post vital signs: Reviewed and stable  Last Vitals:  Vitals:   09/28/17 1259  BP: 104/74  Pulse: 65  Temp: 37.2 C  SpO2: 99%    Last Pain:  Vitals:   09/28/17 1259  TempSrc: Tympanic         Complications: No apparent anesthesia complications

## 2017-09-28 NOTE — Anesthesia Procedure Notes (Signed)
Performed by: Aubra Pappalardo, CRNA Pre-anesthesia Checklist: Patient identified, Emergency Drugs available, Suction available, Patient being monitored and Timeout performed Oxygen Delivery Method: Nasal cannula       

## 2017-09-28 NOTE — Anesthesia Preprocedure Evaluation (Signed)
Anesthesia Evaluation  Patient identified by MRN, date of birth, ID band Patient awake    Reviewed: Allergy & Precautions, NPO status , Patient's Chart, lab work & pertinent test results  History of Anesthesia Complications Negative for: history of anesthetic complications  Airway Mallampati: I  TM Distance: >3 FB Neck ROM: Full    Dental no notable dental hx.    Pulmonary neg pulmonary ROS, neg sleep apnea, neg COPD,    breath sounds clear to auscultation- rhonchi (-) wheezing      Cardiovascular Exercise Tolerance: Good (-) hypertension(-) CAD, (-) Past MI and (-) Cardiac Stents  Rhythm:Regular Rate:Normal - Systolic murmurs and - Diastolic murmurs    Neuro/Psych negative neurological ROS  negative psych ROS   GI/Hepatic Neg liver ROS, GERD  ,  Endo/Other  negative endocrine ROSneg diabetes  Renal/GU negative Renal ROS     Musculoskeletal negative musculoskeletal ROS (+)   Abdominal (+) - obese,   Peds  Hematology negative hematology ROS (+)   Anesthesia Other Findings Past Medical History: No date: B-cell lymphoma (HCC) No date: Cancer ([place])     Comment:  lymphoma, non hodgekins B cell No date: GERD (gastroesophageal reflux disease)   Reproductive/Obstetrics                             Anesthesia Physical Anesthesia Plan  ASA: II  Anesthesia Plan: General   Post-op Pain Management:    Induction: Intravenous  PONV Risk Score and Plan: 2 and Propofol infusion  Airway Management Planned: Natural Airway  Additional Equipment:   Intra-op Plan:   Post-operative Plan:   Informed Consent: I have reviewed the patients History and Physical, chart, labs and discussed the procedure including the risks, benefits and alternatives for the proposed anesthesia with the patient or authorized representative who has indicated his/her understanding and acceptance.   Dental advisory  given  Plan Discussed with: CRNA and Anesthesiologist  Anesthesia Plan Comments:         Anesthesia Quick Evaluation

## 2017-09-28 NOTE — Anesthesia Post-op Follow-up Note (Signed)
Anesthesia QCDR form completed.        

## 2017-09-29 ENCOUNTER — Encounter: Payer: Self-pay | Admitting: Gastroenterology

## 2017-10-05 ENCOUNTER — Ambulatory Visit: Payer: 59 | Attending: Radiation Oncology | Admitting: Radiation Oncology

## 2017-10-11 ENCOUNTER — Telehealth: Payer: Self-pay | Admitting: *Deleted

## 2017-10-11 ENCOUNTER — Other Ambulatory Visit: Payer: Self-pay | Admitting: *Deleted

## 2017-10-11 ENCOUNTER — Encounter: Payer: Self-pay | Admitting: Nurse Practitioner

## 2017-10-11 ENCOUNTER — Inpatient Hospital Stay: Payer: 59 | Attending: Nurse Practitioner | Admitting: Nurse Practitioner

## 2017-10-11 VITALS — BP 134/76 | HR 78 | Temp 97.8°F | Resp 15

## 2017-10-11 DIAGNOSIS — C8331 Diffuse large B-cell lymphoma, lymph nodes of head, face, and neck: Secondary | ICD-10-CM | POA: Insufficient documentation

## 2017-10-11 DIAGNOSIS — L731 Pseudofolliculitis barbae: Secondary | ICD-10-CM | POA: Insufficient documentation

## 2017-10-11 NOTE — Telephone Encounter (Signed)
Pt called in for a lump under left arm and it has been growing for about 2 weeks.  I spoke to lauren and she said she would see her today. I put her in to see her today 3 pm.

## 2017-10-11 NOTE — Progress Notes (Signed)
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 Janese Banks, on 07/15/17. Patient is a 57 y.o. female who presented to PCP with symptoms of right neck swelling in January 2018.  Initially she noticed symptoms since December 2017.  Was referred to ENT.  CT neck showed malignant right lymphadenopathy with heterogeneous enhancement and extracapsular extension occupying both the right level 2 and level 3 nodal stations.  Individually B abnormal lymph nodes measuring up to 4.2 cm in largest dimension and conglomerulation of abnormal lymph nodes is 5-5.5 cm across.  Small asymmetric conspicuity 7 mm lymph node across lower right 3B station.  Mass-effect on right carotid space from abnormal lymph nodes but the major vascular structures in the neck at the skull base including the right IJ remained patent. 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  Cells were CD20 positive and BCL 6 and partially dim BDL-2 positive. B cells were negative for C myc (10%). Ki-67 was 50%. Bone marrow biopsy was negative for lymphoma involvement.  PET/CT showed: FDG avid malignancy is identified in level 2 and 3 nodal stations of the right cervical lymph node chain, consistent with lymphoma.  Focal uptake in the gastric cardia suspicious for malignancy. EGD showed diffuse, moderately erythematous mucosa without bleeding.  Initial nonbleeding gastric ulcer 6 mm was biopsied.  Findings consistent with H. pylori gastritis and patient was started on  antibiotic therapy for same.  Repeat H. pylori testing was negative. EUS also showed H. pylori.  There were a few B cells noted in lamina propria with clonality studies showing no monoclonal B-cell population on initial or confirmatory testing. Patient received 3 cycles of R CHOP PET/CT after R CHOP showed complete metabolic response with no metabolically active lymphoma.  Uniform low-level marrow hypermetabolism throughout the axial skeleton, compatible with reactive marrow state due to chemotherapy.  No residual gastric hypermetabolism.   Interval history-patient presents to symptom management clinic today with complaints of noticing a new lump under her left armpit approximately 2 weeks ago.  She states that initially it had a central black ulceration similar to "blackhead".  She states that she tried to pop it without success.  She notices that the black portion of the ulcer has disappeared and lump remains.  She is concerned for recurrence of lymphoma.  Denies fever, fatigue, or other lumps or bumps.  States she is actively trying to lose weight.   ECOG FS:0 - Asymptomatic  Review of systems- Review of Systems  Constitutional: Negative.   HENT: Negative.   Eyes: Negative.   Respiratory: Negative.   Cardiovascular: Negative.   Gastrointestinal: Negative.   Genitourinary: Negative.   Musculoskeletal: Negative.   Skin: Negative.   Neurological: Negative.   Endo/Heme/Allergies: Negative.   Psychiatric/Behavioral: The patient is nervous/anxious.      Current treatment- s/p 3 cycles of RCHOP with complete response   No Known Allergies   Past Medical History:  Diagnosis Date  . B-cell lymphoma ([place])   . Cancer (HCC)    lymphoma, non hodgekins B cell  .  GERD (gastroesophageal reflux disease)      Past Surgical History:  Procedure Laterality Date  . ABDOMINAL HYSTERECTOMY    . CESAREAN SECTION     x 2   . COLONOSCOPY WITH PROPOFOL N/A 09/28/2017   Procedure: COLONOSCOPY WITH  PROPOFOL;  Surgeon: Jonathon Bellows, MD;  Location: [facility] ENDOSCOPY;  Service: Gastroenterology;  Laterality: N/A;  . ESOPHAGOGASTRODUODENOSCOPY (EGD) WITH PROPOFOL N/A 11/11/2016   Procedure: ESOPHAGOGASTRODUODENOSCOPY (EGD) WITH PROPOFOL;  Surgeon: Jonathon Bellows, MD;  Location: [facility] ENDOSCOPY;  Service: Endoscopy;  Laterality: N/A;  . IR GENERIC HISTORICAL  11/03/2016   IR FLUORO GUIDE PORT INSERTION RIGHT 11/03/2016 Aletta Edouard, MD [facility]-INTERV RAD  . IR REMOVAL TUN ACCESS W/ PORT W/O FL MOD SED  02/15/2017  . REFRACTIVE SURGERY  09/2015    Social History   Socioeconomic History  . Marital status: Single    Spouse name: Not on file  . Number of children: Not on file  . Years of education: Not on file  . Highest education level: Not on file  Social Needs  . Financial resource strain: Not on file  . Food insecurity - worry: Not on file  . Food insecurity - inability: Not on file  . Transportation needs - medical: Not on file  . Transportation needs - non-medical: Not on file  Occupational History  . Not on file  Tobacco Use  . Smoking status: Never Smoker  . Smokeless tobacco: Never Used  Substance and Sexual Activity  . Alcohol use: No  . Drug use: No  . Sexual activity: Yes  Other Topics Concern  . Not on file  Social History Narrative  . Not on file    Family History  Problem Relation Age of Onset  . Cancer Mother        breast  . Cancer Father        lung  . Heart disease Brother 9       MI  . Heart disease Paternal Grandfather        MI     Current Outpatient Medications:  .  allopurinol (ZYLOPRIM) 300 MG tablet, Take by mouth., Disp: , Rfl:  .  docusate sodium (COLACE) 100 MG capsule, Take by mouth., Disp: , Rfl:  .  gabapentin (NEURONTIN) 100 MG capsule, Take 100 mg by mouth at bedtime., Disp: , Rfl:  .  gabapentin (NEURONTIN) 300 MG capsule, Take 300 mg by mouth at bedtime., Disp: , Rfl: 1 .  lidocaine-prilocaine (EMLA) cream, as needed., Disp: , Rfl:  .   Loratadine (CLARITIN) 10 MG CAPS, Take by mouth., Disp: , Rfl:  .  ondansetron (ZOFRAN) 8 MG tablet, Take by mouth., Disp: , Rfl:  .  predniSONE (DELTASONE) 50 MG tablet, Take by mouth., Disp: , Rfl:  .  prochlorperazine (COMPAZINE) 10 MG tablet, Take by mouth., Disp: , Rfl:  .  sulfamethoxazole-trimethoprim (BACTRIM DS,SEPTRA DS) 800-160 MG tablet, Take 1 tablet by mouth 2 (two) times daily., Disp: , Rfl: 0  Physical exam:  Vitals:   10/11/17 1522  BP: 134/76  Pulse: 78  Resp: 15  Temp: 97.8 F (36.6 C)  TempSrc: Oral    Constitutional: Well-developed, well-nourished, and in no distress. Unaccompanied. HENT:  Head: Normocephalic and atraumatic.  Eyes: EOM are normal. Pupils are equal, round, and reactive to light.  Neck: Normal range of motion.  Cardiovascular: Normal rate, regular rhythm and normal heart sounds.  Pulmonary/Chest: Effort normal and breath sounds normal.  Abdominal: Soft. Bowel sounds are normal.  No palpable splenomegaly  Lymphadenopathy:  No palpable axillary cervical, supraclavicular or inguinal adenopathy  Neurological: She is alert and oriented to person, place, and time.  Skin: Skin is warm and dry.  Left axillary - flesh colored, no ulceration. Not erythematous. Not fluctuant. Soft. Not fixed. Nontender to palpation.    CMP Latest Ref Rng & Units 07/15/2017  Glucose 65 - 99 mg/dL 102(H)  BUN 6 - 20 mg/dL 14  Creatinine 0.44 - 1.00 mg/dL 0.78  Sodium 135 - 145 mmol/L 138  Potassium 3.5 - 5.1 mmol/L 4.3  Chloride 101 - 111 mmol/L 103  CO2 22 - 32 mmol/L 27  Calcium 8.9 - 10.3 mg/dL 9.6  Total Protein 6.5 - 8.1 g/dL 7.4  Total Bilirubin 0.3 - 1.2 mg/dL 0.5  Alkaline Phos 38 - 126 U/L 98  AST 15 - 41 U/L 23  ALT 14 - 54 U/L 27   CBC Latest Ref Rng & Units 07/15/2017  WBC 3.6 - 11.0 K/uL 5.5  Hemoglobin 12.0 - 16.0 g/dL 12.5  Hematocrit 35.0 - 47.0 % 37.1  Platelets 150 - 440 K/uL 247     Assessment and plan- Patient is a 57 y.o. female who  presents to symptom management clinic for concerns of new lump under arm in setting of history of diffuse large B-cell lymphoma.  1.  Diffuse large B-cell lymphoma-stage I s/p 3 cycles of R CHOP and I have RT.  Clinically patient is doing well and no evidence of recurrence on today's exam.  2.  Axillary lesion-appearance consistent with ingrown hair or blackhead.  Do not feel ultrasound or antibiotics are necessary at this time. Recommend warm compresses 3-4 times a day and avoiding trauma to area. Patient quite anxious and concerned for recurrence. Will have rtc in 3 days for re-evaluation by dr. Janese Banks.     Visit Diagnosis 1. 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 Ingrown hair     Patient expressed understanding and was in agreement with this plan. She also understands that She can call clinic at any time with any questions, concerns, or complaints.    Beckey Rutter, DNP, AGNP-C [place] at [facility] [phone] (office) 10/11/17 3:19 PM

## 2017-10-11 NOTE — Progress Notes (Signed)
Patient here to be seen as an acute add on today because of a lump in her armpit that has been there for several weeks. She reports seeing a black spot in the center of it when it first appeared, but now it is gone. The lump has increased in size and patient is concerned. She denies having any pain or other problems.

## 2017-10-13 ENCOUNTER — Other Ambulatory Visit: Payer: Self-pay | Admitting: *Deleted

## 2017-10-13 DIAGNOSIS — C8331 Diffuse large B-cell lymphoma, lymph nodes of head, face, and neck: Secondary | ICD-10-CM

## 2017-10-14 ENCOUNTER — Encounter: Payer: Self-pay | Admitting: Oncology

## 2017-10-14 ENCOUNTER — Inpatient Hospital Stay: Payer: 59

## 2017-10-14 ENCOUNTER — Inpatient Hospital Stay (HOSPITAL_BASED_OUTPATIENT_CLINIC_OR_DEPARTMENT_OTHER): Payer: 59 | Admitting: Oncology

## 2017-10-14 VITALS — BP 109/71 | HR 71 | Temp 97.1°F | Resp 16

## 2017-10-14 DIAGNOSIS — Z08 Encounter for follow-up examination after completed treatment for malignant neoplasm: Secondary | ICD-10-CM

## 2017-10-14 DIAGNOSIS — L731 Pseudofolliculitis barbae: Secondary | ICD-10-CM

## 2017-10-14 DIAGNOSIS — C8331 Diffuse large B-cell lymphoma, lymph nodes of head, face, and neck: Secondary | ICD-10-CM

## 2017-10-14 DIAGNOSIS — Z8579 Personal history of other malignant neoplasms of lymphoid, hematopoietic and related tissues: Secondary | ICD-10-CM

## 2017-10-14 LAB — COMPREHENSIVE METABOLIC PANEL
ALBUMIN: 4.1 g/dL (ref 3.5–5.0)
ALK PHOS: 93 U/L (ref 38–126)
ALT: 20 U/L (ref 14–54)
ANION GAP: 8 (ref 5–15)
AST: 21 U/L (ref 15–41)
BILIRUBIN TOTAL: 0.5 mg/dL (ref 0.3–1.2)
BUN: 17 mg/dL (ref 6–20)
CALCIUM: 9.3 mg/dL (ref 8.9–10.3)
CO2: 27 mmol/L (ref 22–32)
Chloride: 103 mmol/L (ref 101–111)
Creatinine, Ser: 0.88 mg/dL (ref 0.44–1.00)
GFR calc non Af Amer: >60 mL/min (ref >60)
GLUCOSE: 95 mg/dL (ref 65–99)
POTASSIUM: 4 mmol/L (ref 3.5–5.1)
SODIUM: 138 mmol/L (ref 135–145)
TOTAL PROTEIN: 7.1 g/dL (ref 6.5–8.1)

## 2017-10-14 LAB — CBC WITH DIFFERENTIAL/PLATELET
BASOS ABS: 0 10*3/uL (ref 0–0.1)
BASOS PCT: 1 %
Eosinophils Absolute: 0 10*3/uL (ref 0–0.7)
Eosinophils Relative: 1 %
HEMATOCRIT: 37.5 % (ref 35.0–47.0)
HEMOGLOBIN: 12.6 g/dL (ref 12.0–16.0)
LYMPHS PCT: 18 %
Lymphs Abs: 0.9 10*3/uL — ABNORMAL LOW (ref 1.0–3.6)
MCH: 30.1 pg (ref 26.0–34.0)
MCHC: 33.7 g/dL (ref 32.0–36.0)
MCV: 89.4 fL (ref 80.0–100.0)
MONO ABS: 0.4 10*3/uL (ref 0.2–0.9)
Monocytes Relative: 8 %
NEUTROS ABS: 3.8 10*3/uL (ref 1.4–6.5)
NEUTROS PCT: 72 %
Platelets: 233 10*3/uL (ref 150–440)
RBC: 4.19 MIL/uL (ref 3.80–5.20)
RDW: 12.7 % (ref 11.5–14.5)
WBC: 5.2 10*3/uL (ref 3.6–11.0)

## 2017-10-14 LAB — LACTATE DEHYDROGENASE: LDH: 122 U/L (ref 98–192)

## 2017-10-14 NOTE — Progress Notes (Signed)
Hematology/Oncology Consult note [facility]  [phone] [phone]  Patient Care Team: [name], NP as PCP - General (Nurse Practitioner)   Name of the patient: Christina [name]  [id number]  [birth year]   Date of visit: 10/14/17 Diagnosis- Stage 1 DLBCL germinal center type. Not double hit. s/p 3 cycles of RCHOP  Chief complaint/ Reason for visit- surveillance visit post RCHOP and IFRT  Heme/Onc history:1. Patient is a 57 year old female who presented to her primary care doctor with symptoms of right neck swelling in January 2018. Her symptoms of an ongoing since December 2017. Patient works at a [employer] (medial in the swelling but the swelling did not go away. She was then referred to ENT.   2. CT of the soft tissue neck with contrast showed malignant right leg lymphadenopathy with heterogeneous enhancement and extracapsular extension occupying both the right level II and level III nodal stations. Individually B abnormal lymph nodes measure up to 4.2 cm in largest dimension and the conglomerulationof abnormal lymph nodes is 5-5.5 cm across. There is a small but asymmetric and conspicuity 7 mm lymph node along the lower right 3-Bstation. Mass effect on the right carotid space from the abnormal nodes but the major vascular structures in the neck at the skull base including the right IJ remained patent.  3. 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 Cells were CD20 positive and BCL 6 and partially dim BCL-2 positive. B cells were negative for C myc (10%). Mom one was positive in 50% of the cells. Ki-67 was 50%.Bone marrow biopsy was negative for lymphoma involvement  4.  PET/CT showed IMPRESSION: 1. FDG avid malignancy is identified in the level 2 and 3 nodal stations of the right cervical lymph node chain, consistent with the patient's known lymphoma. 2.  Focal uptake in the gastric cardia is suspicious for malignancy as well. Recommend direct visualization and biopsy as clinically Warranted.  5. EGD showed diffuse moderately erythematous mucosa without bleeding initial nonbleeding gastric ulcer 6 mm which was biopsied. Findings were consistent with H. pylori gastritis and patient was started on antibiotictherapy for the same. Repeat H pylori testing negative  6. EUS also showed H pylori. There were few B cells noted in lamina propria and clonality studies have been ordered. Initial testing for B cell clonality showed no monoclonal B-cell population. Confirmation testing also did not reveal clonal B cells  7. PET/CT scan after cycles of RCHOP showed: IMPRESSION: 1. Complete metabolic response. No metabolically active lymphoma. 2. Uniform low-level marrow hypermetabolism throughout the axial skeleton, compatible with reactive marrow state due to chemotherapy. 3. No residual gastric hypermetabolism   Interval history- she noticed a swelling in her left arm pit and was concerned about it. Her appetite is good. Denies any fever, fatigue or lumps or bumps anywheer else. She is trying to lose weight  ECOG PS- 0 Pain scale- 0   Review of systems- Review of Systems  Constitutional: Negative for chills, fever, malaise/fatigue and weight loss.  HENT: Negative for congestion, ear discharge and nosebleeds.   Eyes: Negative for blurred vision.  Respiratory: Negative for cough, hemoptysis, sputum production, shortness of breath and wheezing.   Cardiovascular: Negative for chest pain, palpitations, orthopnea and claudication.  Gastrointestinal: Negative for abdominal pain, blood in stool, constipation, diarrhea, heartburn, melena, nausea and vomiting.  Genitourinary: Negative for dysuria, flank pain, frequency, hematuria and urgency.  Musculoskeletal: Negative for back pain, joint  pain and myalgias.  Skin: Negative for rash.  Neurological:  Negative for dizziness, tingling, focal weakness, seizures, weakness and headaches.  Endo/Heme/Allergies: Does not bruise/bleed easily.  Psychiatric/Behavioral: Negative for depression and suicidal ideas. The patient does not have insomnia.        No Known Allergies   Past Medical History:  Diagnosis Date  . B-cell lymphoma ([place])   . Cancer (HCC)    lymphoma, non hodgekins B cell  . GERD (gastroesophageal reflux disease)      Past Surgical History:  Procedure Laterality Date  . ABDOMINAL HYSTERECTOMY    . CESAREAN SECTION     x 2   . COLONOSCOPY WITH PROPOFOL N/A 09/28/2017   Procedure: COLONOSCOPY WITH PROPOFOL;  Surgeon: Jonathon Bellows, MD;  Location: [facility] ENDOSCOPY;  Service: Gastroenterology;  Laterality: N/A;  . ESOPHAGOGASTRODUODENOSCOPY (EGD) WITH PROPOFOL N/A 11/11/2016   Procedure: ESOPHAGOGASTRODUODENOSCOPY (EGD) WITH PROPOFOL;  Surgeon: Jonathon Bellows, MD;  Location: [facility] ENDOSCOPY;  Service: Endoscopy;  Laterality: N/A;  . IR GENERIC HISTORICAL  11/03/2016   IR FLUORO GUIDE PORT INSERTION RIGHT 11/03/2016 Aletta Edouard, MD [facility]-INTERV RAD  . IR REMOVAL TUN ACCESS W/ PORT W/O FL MOD SED  02/15/2017  . REFRACTIVE SURGERY  09/2015    Social History   Socioeconomic History  . Marital status: Single    Spouse name: Not on file  . Number of children: Not on file  . Years of education: Not on file  . Highest education level: Not on file  Social Needs  . Financial resource strain: Not on file  . Food insecurity - worry: Not on file  . Food insecurity - inability: Not on file  . Transportation needs - medical: Not on file  . Transportation needs - non-medical: Not on file  Occupational History  . Not on file  Tobacco Use  . Smoking status: Never Smoker  . Smokeless tobacco: Never Used  Substance and Sexual Activity  . Alcohol use: No  . Drug use: No  . Sexual activity: Yes  Other Topics Concern  . Not on file  Social History Narrative  . Not on file    Family  History  Problem Relation Age of Onset  . Cancer Mother        breast  . Cancer Father        lung  . Heart disease Brother 40       MI  . Heart disease Paternal Grandfather        MI     Current Outpatient Medications:  .  allopurinol (ZYLOPRIM) 300 MG tablet, Take by mouth., Disp: , Rfl:  .  docusate sodium (COLACE) 100 MG capsule, Take by mouth., Disp: , Rfl:  .  gabapentin (NEURONTIN) 100 MG capsule, Take 100 mg by mouth at bedtime., Disp: , Rfl:  .  gabapentin (NEURONTIN) 300 MG capsule, Take 300 mg by mouth at bedtime., Disp: , Rfl: 1 .  lidocaine-prilocaine (EMLA) cream, as needed., Disp: , Rfl:  .  Loratadine (CLARITIN) 10 MG CAPS, Take by mouth., Disp: , Rfl:  .  ondansetron (ZOFRAN) 8 MG tablet, Take by mouth., Disp: , Rfl:  .  predniSONE (DELTASONE) 50 MG tablet, Take by mouth., Disp: , Rfl:  .  prochlorperazine (COMPAZINE) 10 MG tablet, Take by mouth., Disp: , Rfl:   Physical exam:  Vitals:   10/14/17 1421  BP: 109/71  Pulse: 71  Resp: 16  Temp: (!) 97.1 F (36.2 C)  TempSrc: Tympanic   Physical Exam  Constitutional: She is oriented to person, place, and time and well-developed, well-nourished, and in no distress.  HENT:  Head: Normocephalic and atraumatic.  Eyes: EOM are normal. Pupils are equal, round, and reactive to light.  Neck: Normal range of motion.  Cardiovascular: Normal rate, regular rhythm and normal heart sounds.  Pulmonary/Chest: Effort normal and breath sounds normal.  Abdominal: Soft. Bowel sounds are normal.  No palpable splenomegaly  Lymphadenopathy:  No palpable axillary cervical, supraclavicular or inguinal adenopathy  Neurological: She is alert and oriented to person, place, and time.  Skin: Skin is warm and dry.  The patient has a superficial swelling of the hair follicle in the left armpit with no obvious cellulitis     CMP Latest Ref Rng & Units 10/14/2017  Glucose 65 - 99 mg/dL 95  BUN 6 - 20 mg/dL 17  Creatinine 0.44 - 1.00  mg/dL 0.88  Sodium 135 - 145 mmol/L 138  Potassium 3.5 - 5.1 mmol/L 4.0  Chloride 101 - 111 mmol/L 103  CO2 22 - 32 mmol/L 27  Calcium 8.9 - 10.3 mg/dL 9.3  Total Protein 6.5 - 8.1 g/dL 7.1  Total Bilirubin 0.3 - 1.2 mg/dL 0.5  Alkaline Phos 38 - 126 U/L 93  AST 15 - 41 U/L 21  ALT 14 - 54 U/L 20   CBC Latest Ref Rng & Units 10/14/2017  WBC 3.6 - 11.0 K/uL 5.2  Hemoglobin 12.0 - 16.0 g/dL 12.6  Hematocrit 35.0 - 47.0 % 37.5  Platelets 150 - 440 K/uL 233      Assessment and plan- Patient is a 57 y.o. female with h/o DLBCL stage 1 s/p 3 cycles of RCHOPand IFRT  Clinically patient is doing well and there is no evidence of recurrence on today's exam.  I will see her back in 3 months time with CBC, CMP, LDH and TSH.  Patient is a localized swelling of her hair follicle in the left armpit.  There is no palpable adenopathy and she does not require an ultrasound of the axilla for this.     Visit Diagnosis 1. 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 Encounter for follow-up surveillance of diffuse large B-cell lymphoma      Dr. Randa Evens, MD, MPH [facility] at [facility] ([facility]) Pager- [phone] 10/14/2017 2:52 PM

## 2018-01-12 ENCOUNTER — Ambulatory Visit: Payer: 59 | Admitting: Oncology

## 2018-01-12 ENCOUNTER — Other Ambulatory Visit: Payer: 59

## 2018-01-16 IMAGING — CR DG ABDOMEN 2V
1 series · 2 of 2 positions shown · non-contrast
Comparison: None.

CLINICAL DATA: No bowel movement for 10 days.  Chemotherapy.

EXAM:
ABDOMEN - 2 VIEW

[Series 1: dg abd 2 views · 0.14mm/px · 2 of 2 slices shown]
[im 1/2]
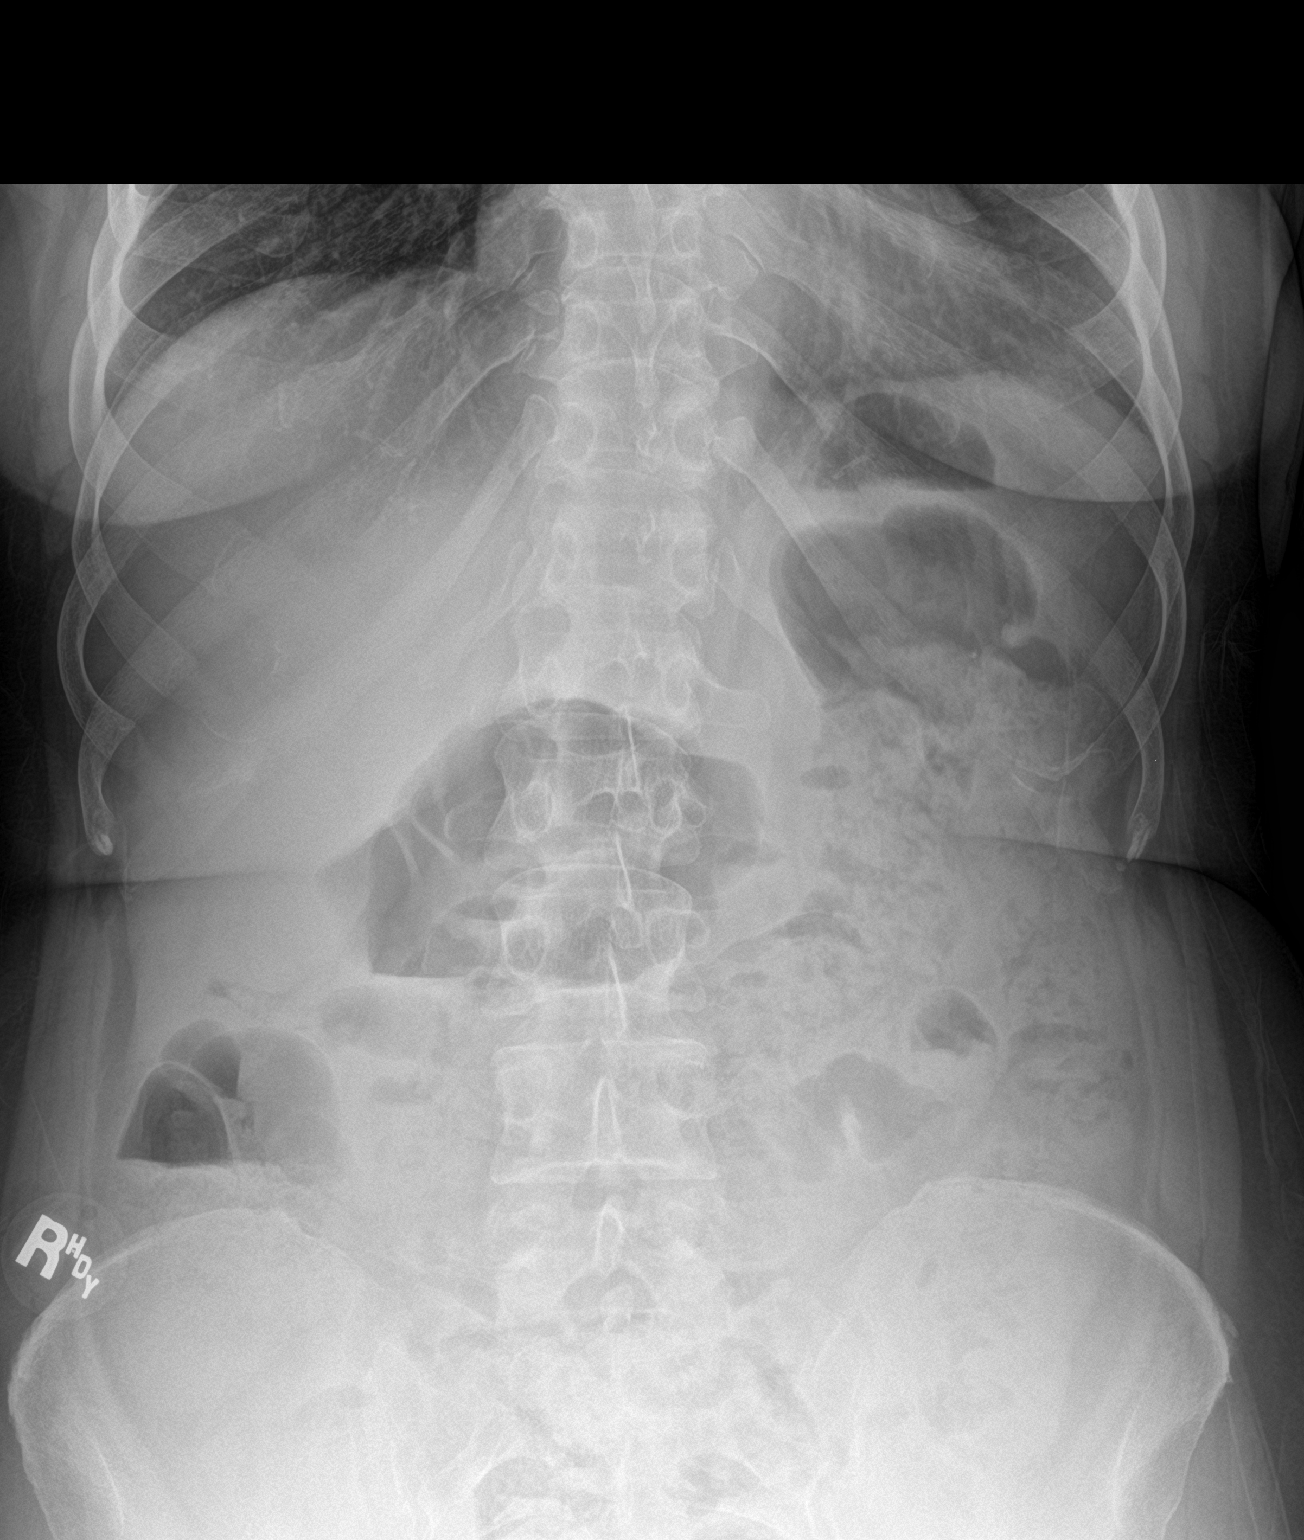
[im 2/2]
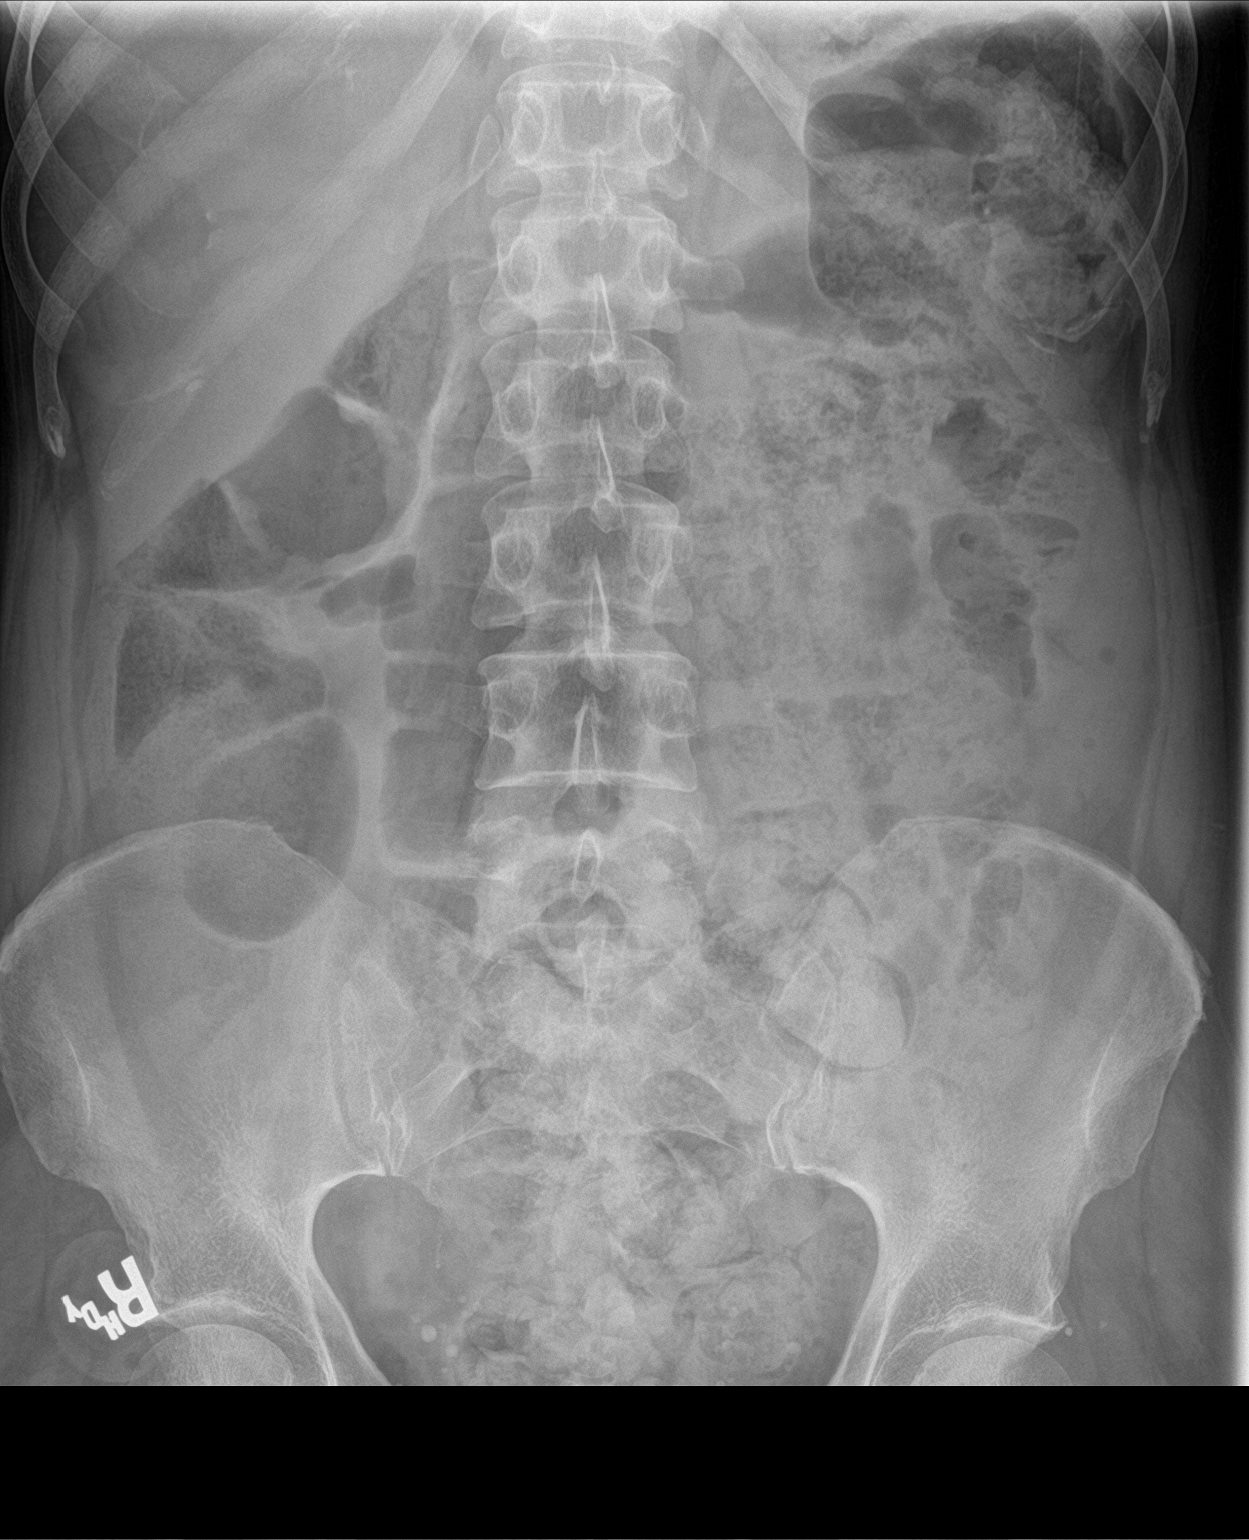

[2 of 2 positions shown; findings below may reference images not displayed]

FINDINGS: The distal end of a right central line terminates at the caval
atrial junction. Minimal opacity in left lung base may represent
atelectasis. No free air, portal venous gas, or pneumatosis. Fecal
loading from the mid transverse colon through the rectum. No
evidence of bowel obstruction. Phleboliths in the pelvis.
IMPRESSION: Fecal loading from the mid transverse colon through the rectum. No
other acute abnormalities.

## 2018-01-19 ENCOUNTER — Inpatient Hospital Stay: Payer: 59 | Attending: Oncology

## 2018-01-19 ENCOUNTER — Inpatient Hospital Stay (HOSPITAL_BASED_OUTPATIENT_CLINIC_OR_DEPARTMENT_OTHER): Payer: 59 | Admitting: Oncology

## 2018-01-19 ENCOUNTER — Encounter: Payer: Self-pay | Admitting: Oncology

## 2018-01-19 VITALS — BP 121/83 | HR 71 | Temp 98.1°F | Resp 18 | Ht 66.0 in | Wt 165.3 lb

## 2018-01-19 DIAGNOSIS — C8331 Diffuse large B-cell lymphoma, lymph nodes of head, face, and neck: Secondary | ICD-10-CM | POA: Insufficient documentation

## 2018-01-19 DIAGNOSIS — Z9221 Personal history of antineoplastic chemotherapy: Secondary | ICD-10-CM | POA: Diagnosis not present

## 2018-01-19 DIAGNOSIS — Z8579 Personal history of other malignant neoplasms of lymphoid, hematopoietic and related tissues: Secondary | ICD-10-CM

## 2018-01-19 DIAGNOSIS — R5383 Other fatigue: Secondary | ICD-10-CM | POA: Insufficient documentation

## 2018-01-19 DIAGNOSIS — Z79899 Other long term (current) drug therapy: Secondary | ICD-10-CM | POA: Diagnosis not present

## 2018-01-19 DIAGNOSIS — Z08 Encounter for follow-up examination after completed treatment for malignant neoplasm: Secondary | ICD-10-CM

## 2018-01-19 LAB — COMPREHENSIVE METABOLIC PANEL
ALT: 18 U/L (ref 14–54)
AST: 21 U/L (ref 15–41)
Albumin: 4.1 g/dL (ref 3.5–5.0)
Alkaline Phosphatase: 84 U/L (ref 38–126)
Anion gap: 6 (ref 5–15)
BILIRUBIN TOTAL: 0.4 mg/dL (ref 0.3–1.2)
BUN: 17 mg/dL (ref 6–20)
CHLORIDE: 105 mmol/L (ref 101–111)
CO2: 27 mmol/L (ref 22–32)
CREATININE: 0.82 mg/dL (ref 0.44–1.00)
Calcium: 9.5 mg/dL (ref 8.9–10.3)
GFR calc Af Amer: >60 mL/min (ref >60)
Glucose, Bld: 99 mg/dL (ref 65–99)
Potassium: 4.5 mmol/L (ref 3.5–5.1)
Sodium: 138 mmol/L (ref 135–145)
TOTAL PROTEIN: 7.3 g/dL (ref 6.5–8.1)

## 2018-01-19 LAB — CBC WITH DIFFERENTIAL/PLATELET
Basophils Absolute: 0 10*3/uL (ref 0–0.1)
Basophils Relative: 0 %
EOS ABS: 0.1 10*3/uL (ref 0–0.7)
EOS PCT: 1 %
HCT: 36.5 % (ref 35.0–47.0)
Hemoglobin: 12.7 g/dL (ref 12.0–16.0)
LYMPHS ABS: 1 10*3/uL (ref 1.0–3.6)
Lymphocytes Relative: 19 %
MCH: 30.6 pg (ref 26.0–34.0)
MCHC: 34.8 g/dL (ref 32.0–36.0)
MCV: 87.9 fL (ref 80.0–100.0)
Monocytes Absolute: 0.4 10*3/uL (ref 0.2–0.9)
Monocytes Relative: 8 %
Neutro Abs: 4 10*3/uL (ref 1.4–6.5)
Neutrophils Relative %: 72 %
PLATELETS: 240 10*3/uL (ref 150–440)
RBC: 4.15 MIL/uL (ref 3.80–5.20)
RDW: 13 % (ref 11.5–14.5)
WBC: 5.5 10*3/uL (ref 3.6–11.0)

## 2018-01-19 LAB — LACTATE DEHYDROGENASE: LDH: 124 U/L (ref 98–192)

## 2018-01-19 LAB — TSH: TSH: 5.015 u[IU]/mL — ABNORMAL HIGH (ref 0.350–4.500)

## 2018-01-19 NOTE — Progress Notes (Signed)
Patient c/o being light headed x 2-3 months / patient states she feels like her sugar has drop and something is not right ( taking a shower she becomes light headed and has to go lay down because she feel like she going to pass out), Patient also reports she has been  feeling fatigue with headaches.

## 2018-01-20 NOTE — Progress Notes (Signed)
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 Patient is a 57 year old female who presented to her primary care doctor with symptoms of right neck swelling in January 2018. Her symptoms of an ongoing since December 2017. Patient works at a [employer] (medial in the swelling but the swelling did not go away. She was then referred to ENT.   2. CT of the soft tissue neck with contrast showed malignant right leg lymphadenopathy with heterogeneous enhancement and extracapsular extension occupying both the right level II and level III nodal stations. Individually B abnormal lymph nodes measure up to 4.2 cm in largest dimension and the conglomerulationof abnormal lymph nodes is 5-5.5 cm across. There is a small but asymmetric and conspicuity 7 mm lymph node along the lower right 3-Bstation. Mass effect on the right carotid space from the abnormal nodes but the major vascular structures in the neck at the skull base including the right IJ remained patent.  3. 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 Cells were CD20 positive and BCL 6 and partially dim BCL-2 positive. B cells were negative for C myc (10%). Mom one was positive in 50% of the cells. Ki-67 was 50%.Bone marrow biopsy was negative for lymphoma involvement  4. PET/CT showed IMPRESSION: 1. FDG avid malignancy is identified in the level 2 and 3 nodal stations of the right cervical lymph node chain, consistent with the patient's known lymphoma. 2.  Focal uptake in the gastric cardia is suspicious for malignancy as well. Recommend direct visualization and biopsy as clinically Warranted.  5. EGD showed diffuse moderately erythematous mucosa without bleeding initial nonbleeding gastric ulcer 6 mm which was biopsied. Findings were consistent with H. pylori gastritis and patient was started on antibiotictherapy for the same. Repeat H pylori testing negative  6. EUS also showed H pylori. There were few B cells noted in lamina propria and clonality studies have been ordered. Initial testing for B cell clonality showed no monoclonal B-cell population. Confirmation testing also did not reveal clonal B cells  7. PET/CT scan after cycles of RCHOP showed: IMPRESSION: 1. Complete metabolic response. No metabolically active lymphoma. 2. Uniform low-level marrow hypermetabolism throughout the axial skeleton, compatible with reactive marrow state due to chemotherapy. 3. No residual gastric hypermetabolism  8. She completed IFRT to her neck in June 2019  Interval history- reports feeling fatigued on occasions. Appetite is good. She denies any unintentional weight loss, night sweats, lumps or bumps anywhere  ECOG PS- 0 Pain scale- 0   Review of systems- Review of Systems  Constitutional: Positive for malaise/fatigue. Negative for chills, fever and weight loss.  HENT: Negative for congestion, ear discharge and nosebleeds.   Eyes: Negative for blurred vision.  Respiratory: Negative for cough, hemoptysis, sputum production, shortness of breath and wheezing.   Cardiovascular: Negative for chest pain, palpitations, orthopnea and claudication.  Gastrointestinal: Negative for abdominal pain, blood in stool, constipation, diarrhea, heartburn, melena, nausea and vomiting.  Genitourinary: Negative for dysuria, flank pain, frequency, hematuria and urgency.  Musculoskeletal: Negative for back pain, joint  pain and myalgias.  Skin: Negative for rash.    Neurological: Negative for dizziness, tingling, focal weakness, seizures, weakness and headaches.  Endo/Heme/Allergies: Does not bruise/bleed easily.  Psychiatric/Behavioral: Negative for depression and suicidal ideas. The patient does not have insomnia.      No Known Allergies   Past Medical History:  Diagnosis Date  . B-cell lymphoma ([place])   . Cancer (HCC)    lymphoma, non hodgekins B cell  . GERD (gastroesophageal reflux disease)      Past Surgical History:  Procedure Laterality Date  . ABDOMINAL HYSTERECTOMY    . CESAREAN SECTION     x 2   . COLONOSCOPY WITH PROPOFOL N/A 09/28/2017   Procedure: COLONOSCOPY WITH PROPOFOL;  Surgeon: Jonathon Bellows, MD;  Location: [facility] ENDOSCOPY;  Service: Gastroenterology;  Laterality: N/A;  . ESOPHAGOGASTRODUODENOSCOPY (EGD) WITH PROPOFOL N/A 11/11/2016   Procedure: ESOPHAGOGASTRODUODENOSCOPY (EGD) WITH PROPOFOL;  Surgeon: Jonathon Bellows, MD;  Location: [facility] ENDOSCOPY;  Service: Endoscopy;  Laterality: N/A;  . IR GENERIC HISTORICAL  11/03/2016   IR FLUORO GUIDE PORT INSERTION RIGHT 11/03/2016 Aletta Edouard, MD [facility]-INTERV RAD  . IR REMOVAL TUN ACCESS W/ PORT W/O FL MOD SED  02/15/2017  . REFRACTIVE SURGERY  09/2015    Social History   Socioeconomic History  . Marital status: Single    Spouse name: Not on file  . Number of children: Not on file  . Years of education: Not on file  . Highest education level: Not on file  Occupational History  . Not on file  Social Needs  . Financial resource strain: Not on file  . Food insecurity:    Worry: Not on file    Inability: Not on file  . Transportation needs:    Medical: Not on file    Non-medical: Not on file  Tobacco Use  . Smoking status: Never Smoker  . Smokeless tobacco: Never Used  Substance and Sexual Activity  . Alcohol use: No  . Drug use: No  . Sexual activity: Yes  Lifestyle  . Physical activity:    Days per week: Not on file    Minutes per session: Not on file  . Stress: Not on  file  Relationships  . Social connections:    Talks on phone: Not on file    Gets together: Not on file    Attends religious service: Not on file    Active member of club or organization: Not on file    Attends meetings of clubs or organizations: Not on file    Relationship status: Not on file  . Intimate partner violence:    Fear of current or ex partner: Not on file    Emotionally abused: Not on file    Physically abused: Not on file    Forced sexual activity: Not on file  Other Topics Concern  . Not on file  Social History Narrative  . Not on file    Family History  Problem Relation Age of Onset  . Cancer Mother        breast  . Cancer Father        lung  . Heart disease Brother 50       MI  . Heart disease Paternal Grandfather        MI     Current Outpatient Medications:  .  gabapentin (NEURONTIN) 100 MG capsule, Take 100 mg by mouth at bedtime., Disp: , Rfl:  .  lidocaine-prilocaine (EMLA) cream, as needed., Disp: , Rfl:  Physical exam:  Vitals:   01/19/18 1025 01/19/18 1027  BP:  121/83  Pulse:  71  Resp:  18  Temp:  98.1 F (36.7 C)  TempSrc:  Tympanic  SpO2:  97%  Weight: 165 lb 4.8 oz (75 kg) 165 lb 4.8 oz (75 kg)  Height:  '5\' 6"'  (1.676 m)   Physical Exam  Constitutional: She is oriented to person, place, and time. She appears well-developed and well-nourished.  HENT:  Head: Normocephalic and atraumatic.  Eyes: Pupils are equal, round, and reactive to light. EOM are normal.  Neck: Normal range of motion.  Cardiovascular: Normal rate, regular rhythm and normal heart sounds.  Pulmonary/Chest: Effort normal and breath sounds normal.  Abdominal: Soft. Bowel sounds are normal.  Lymphadenopathy:  No palpable cervical, supraclavicular, axillary or inguinal adenopathy  Neurological: She is alert and oriented to person, place, and time.  Skin: Skin is warm and dry.     CMP Latest Ref Rng & Units 01/19/2018  Glucose 65 - 99 mg/dL 99  BUN 6 - 20 mg/dL  17  Creatinine 0.44 - 1.00 mg/dL 0.82  Sodium 135 - 145 mmol/L 138  Potassium 3.5 - 5.1 mmol/L 4.5  Chloride 101 - 111 mmol/L 105  CO2 22 - 32 mmol/L 27  Calcium 8.9 - 10.3 mg/dL 9.5  Total Protein 6.5 - 8.1 g/dL 7.3  Total Bilirubin 0.3 - 1.2 mg/dL 0.4  Alkaline Phos 38 - 126 U/L 84  AST 15 - 41 U/L 21  ALT 14 - 54 U/L 18   CBC Latest Ref Rng & Units 01/19/2018  WBC 3.6 - 11.0 K/uL 5.5  Hemoglobin 12.0 - 16.0 g/dL 12.7  Hematocrit 35.0 - 47.0 % 36.5  Platelets 150 - 440 K/uL 240    Assessment and plan- Patient is a 57 y.o. female h/o Satge I DLBCL double expressor s/p 3 cycles of RCHOP and IFRT currently in remission  Clinically patient is doing well.  She has mild fatigue but does not have any other B symptoms such as unintentional weight loss night sweats.  No palpable adenopathy or splenomegaly on today's exam.  Her labs are normal.  She does have a mildly elevated TSH which I will repeat in 3 months time.  I will see her back in 3 months with CBC CMP and LDH   Visit Diagnosis 1. 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 Encounter for follow-up surveillance of diffuse large B-cell lymphoma      Dr. Randa Evens, MD, MPH [facility] [phone] 01/20/2018 9:46 AM

## 2018-01-27 ENCOUNTER — Telehealth: Payer: Self-pay | Admitting: *Deleted

## 2018-01-27 NOTE — Telephone Encounter (Signed)
Pt called wanting labs results. Cbc/d, met c, LDH all normal. Tsh 5.0.  She states she has been tired, hoarse, sometime lightheadness, sometimes dizzy. She has to take naps every afternoon. She start a herbal life in Jan. And would drink shake and boiled egg in am, then shake at lunch , then almonds, then good meal in evening.  Then she thought the fatigue was coming from herbal life and stopped it 3rd week of feb. And then she felt no better off the herbal life so in May she has been back on herbal life x 2 weeks.  I spoke to Dr. Rogue Bussing and he states that he feels like the tsh being slightly elevated would not give her the sx. The sx are nonspecific but suggested to see NP. She is agreeable to coming to [facility] Monday at 9:30

## 2018-01-30 ENCOUNTER — Inpatient Hospital Stay: Payer: 59

## 2018-01-30 ENCOUNTER — Inpatient Hospital Stay (HOSPITAL_BASED_OUTPATIENT_CLINIC_OR_DEPARTMENT_OTHER): Payer: 59 | Admitting: Oncology

## 2018-01-30 ENCOUNTER — Encounter: Payer: Self-pay | Admitting: Oncology

## 2018-01-30 VITALS — BP 112/73 | HR 73 | Temp 97.8°F | Resp 18 | Ht 66.0 in | Wt 165.0 lb

## 2018-01-30 DIAGNOSIS — Z9221 Personal history of antineoplastic chemotherapy: Secondary | ICD-10-CM | POA: Diagnosis not present

## 2018-01-30 DIAGNOSIS — R5383 Other fatigue: Secondary | ICD-10-CM | POA: Diagnosis not present

## 2018-01-30 DIAGNOSIS — Z79899 Other long term (current) drug therapy: Secondary | ICD-10-CM | POA: Diagnosis not present

## 2018-01-30 DIAGNOSIS — C8331 Diffuse large B-cell lymphoma, lymph nodes of head, face, and neck: Secondary | ICD-10-CM

## 2018-01-30 LAB — CKMB (ARMC ONLY): CK, MB: 1.2 ng/mL (ref 0.5–5.0)

## 2018-01-30 LAB — SEDIMENTATION RATE: Sed Rate: 18 mm/hr (ref 0–30)

## 2018-01-30 LAB — VITAMIN B12: Vitamin B-12: 479 pg/mL (ref 180–914)

## 2018-01-30 NOTE — Progress Notes (Signed)
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 s/p 3 cycles of RCHOP and IFRT  Chief complaint/ Reason for visit- Routine f/u visit for lymphoma  Heme/Onc history: 1. Patient is a 57 year old female who presented to her primary care doctor with symptoms of right neck swelling in January 2018. Her symptoms of an ongoing since December 2017. Patient works at a [employer] (medial in the swelling but the swelling did not go away. She was then referred to ENT.   2. CT of the soft tissue neck with contrast showed malignant right leg lymphadenopathy with heterogeneous enhancement and extracapsular extension occupying both the right level II and level III nodal stations. Individually B abnormal lymph nodes measure up to 4.2 cm in largest dimension and the conglomerulationof abnormal lymph nodes is 5-5.5 cm across. There is a small but asymmetric and conspicuity 7 mm lymph node along the lower right 3-Bstation. Mass effect on the right carotid space from the abnormal nodes but the major vascular structures in the neck at the skull base including the right IJ remained patent.  3. 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 Cells were CD20 positive and BCL 6 and partially dim BCL-2 positive. B cells were negative for C myc (10%). Mom one was positive in 50% of the cells. Ki-67 was 50%.Bone marrow biopsy was negative for lymphoma involvement  4. PET/CT showed IMPRESSION: 1. FDG avid malignancy is identified in the level 2 and 3 nodal stations of the right cervical lymph node chain, consistent with the patient's known lymphoma. 2.  Focal uptake in the gastric cardia is suspicious for malignancy as well. Recommend direct visualization and biopsy as clinically Warranted.  5. EGD showed diffuse moderately erythematous mucosa without bleeding initial nonbleeding gastric ulcer 6 mm which was biopsied. Findings were consistent with H. pylori gastritis and patient was started on antibiotictherapy for the same. Repeat H pylori testing negative  6. EUS also showed H pylori. There were few B cells noted in lamina propria and clonality studies have been ordered. Initial testing for B cell clonality showed no monoclonal B-cell population. Confirmation testing also did not reveal clonal B cells  7. PET/CT scan after cycles of RCHOP showed: IMPRESSION: 1. Complete metabolic response. No metabolically active lymphoma. 2. Uniform low-level marrow hypermetabolism throughout the axial skeleton, compatible with reactive marrow state due to chemotherapy. 3. No residual gastric hypermetabolism  8. She completed IFRT to her neck in June 2019.  Interval history- Patient reports today she is extremely fatigued. She is unable to carry out everyday functions due to "sleepiness".  Patient reports fatigue for over 1 year now but severe fatigue began approximately about a month ago. Sentinal symptom the patient feels fatigue began with: significant change in weight and hot flashes and itching. Symptoms of her fatigue have been diffuse soft tissue aches and pains, feelings of depression and general malaise. Patient describes the following psychologic symptoms: stress at work and stress in the family and cancer diagnosis. Patient denies fever, exercise intolerance and GI blood loss. Symptoms have gradually worsened. Severity has been struggles to carry out day to day responsibilities.. Previous visits for this problem: none.   Denies  any night sweats or lumps or bumps.   Recent lab work shows abnormal TSH at 5.015.  She is currently not taking any  medications.  She does not have a PCP that she sees regularly.  ECOG PS- 0 Pain scale- 0   Review of systems- Review of Systems  Constitutional: Positive for malaise/fatigue (Severe). Negative for chills, fever and weight loss.  HENT: Positive for nosebleeds (Two remote episodes). Negative for congestion and ear discharge.   Eyes: Negative for blurred vision.  Respiratory: Negative for cough, hemoptysis, sputum production, shortness of breath and wheezing.   Cardiovascular: Negative for chest pain, palpitations, orthopnea and claudication.  Gastrointestinal: Negative for abdominal pain, blood in stool, constipation, diarrhea, heartburn, melena, nausea and vomiting.  Genitourinary: Negative for dysuria, flank pain, frequency, hematuria and urgency.  Musculoskeletal: Negative for back pain, joint pain and myalgias.  Skin: Positive for itching (Intermittently-mainly on torso). Negative for rash.  Neurological: Negative for dizziness, tingling, focal weakness, seizures, weakness and headaches.  Endo/Heme/Allergies: Does not bruise/bleed easily.  Psychiatric/Behavioral: Negative for depression and suicidal ideas. The patient is nervous/anxious. The patient does not have insomnia.      No Known Allergies   Past Medical History:  Diagnosis Date  . B-cell lymphoma ([place])   . Cancer (HCC)    lymphoma, non hodgekins B cell  . GERD (gastroesophageal reflux disease)      Past Surgical History:  Procedure Laterality Date  . ABDOMINAL HYSTERECTOMY    . CESAREAN SECTION     x 2   . COLONOSCOPY WITH PROPOFOL N/A 09/28/2017   Procedure: COLONOSCOPY WITH PROPOFOL;  Surgeon: Jonathon Bellows, MD;  [facility] ENDOSCOPY;  Service: Gastroenterology;  Laterality: N/A;  . ESOPHAGOGASTRODUODENOSCOPY (EGD) WITH PROPOFOL N/A 11/11/2016   Procedure: ESOPHAGOGASTRODUODENOSCOPY (EGD) WITH PROPOFOL;  Surgeon: Jonathon Bellows, MD;  Location: [facility] ENDOSCOPY;  Service: Endoscopy;  Laterality: N/A;  . IR GENERIC  HISTORICAL  11/03/2016   IR FLUORO GUIDE PORT INSERTION RIGHT 11/03/2016 Aletta Edouard, MD [facility]-INTERV RAD  . IR REMOVAL TUN ACCESS W/ PORT W/O FL MOD SED  02/15/2017  . REFRACTIVE SURGERY  09/2015    Social History   Socioeconomic History  . Marital status: Single    Spouse name: Not on file  . Number of children: Not on file  . Years of education: Not on file  . Highest education level: Not on file  Occupational History  . Not on file  Social Needs  . Financial resource strain: Not on file  . Food insecurity:    Worry: Not on file    Inability: Not on file  . Transportation needs:    Medical: Not on file    Non-medical: Not on file  Tobacco Use  . Smoking status: Never Smoker  . Smokeless tobacco: Never Used  Substance and Sexual Activity  . Alcohol use: No  . Drug use: No  . Sexual activity: Yes  Lifestyle  . Physical activity:    Days per week: Not on file    Minutes per session: Not on file  . Stress: Not on file  Relationships  . Social connections:    Talks on phone: Not on file    Gets together: Not on file    Attends religious service: Not on file    Active member of club or organization: Not on file    Attends meetings of clubs or organizations: Not on file    Relationship status: Not on file  . Intimate partner violence:  Fear of current or ex partner: Not on file    Emotionally abused: Not on file    Physically abused: Not on file    Forced sexual activity: Not on file  Other Topics Concern  . Not on file  Social History Narrative  . Not on file    Family History  Problem Relation Age of Onset  . Cancer Mother        breast  . Cancer Father        lung  . Heart disease Brother 61       MI  . Heart disease Paternal Grandfather        MI     Current Outpatient Medications:  .  gabapentin (NEURONTIN) 100 MG capsule, Take 100 mg by mouth at bedtime., Disp: , Rfl:  .  lidocaine-prilocaine (EMLA) cream, as needed., Disp: , Rfl:   Physical  exam:  Vitals:   01/30/18 0936 01/30/18 0957  BP: 112/73 112/73  Pulse: 73   Resp: 18   Temp: 97.8 F (36.6 C)   TempSrc: Tympanic   Weight: 165 lb (74.8 kg) 165 lb (74.8 kg)  Height: '5\' 6"'  (1.676 m)    Physical Exam  Constitutional: She is oriented to person, place, and time. Vital signs are normal.  HENT:  Head: Normocephalic and atraumatic.  Eyes: Pupils are equal, round, and reactive to light.  Neck: Normal range of motion. Neck supple. No thyromegaly present.  Cardiovascular: Normal rate, regular rhythm and normal heart sounds.  No murmur heard. Pulmonary/Chest: Effort normal and breath sounds normal. She has no wheezes.  Abdominal: Soft. Normal appearance and bowel sounds are normal. She exhibits no distension. There is no tenderness.  Musculoskeletal: Normal range of motion. She exhibits no edema.  Lymphadenopathy:    She has no cervical adenopathy.    She has no axillary adenopathy.  Neurological: She is alert and oriented to person, place, and time.  Skin: Skin is warm and dry. No rash noted.  Psychiatric: Judgment normal.     CMP Latest Ref Rng & Units 01/19/2018  Glucose 65 - 99 mg/dL 99  BUN 6 - 20 mg/dL 17  Creatinine 0.44 - 1.00 mg/dL 0.82  Sodium 135 - 145 mmol/L 138  Potassium 3.5 - 5.1 mmol/L 4.5  Chloride 101 - 111 mmol/L 105  CO2 22 - 32 mmol/L 27  Calcium 8.9 - 10.3 mg/dL 9.5  Total Protein 6.5 - 8.1 g/dL 7.3  Total Bilirubin 0.3 - 1.2 mg/dL 0.4  Alkaline Phos 38 - 126 U/L 84  AST 15 - 41 U/L 21  ALT 14 - 54 U/L 18   CBC Latest Ref Rng & Units 01/19/2018  WBC 3.6 - 11.0 K/uL 5.5  Hemoglobin 12.0 - 16.0 g/dL 12.7  Hematocrit 35.0 - 47.0 % 36.5  Platelets 150 - 440 K/uL 240    Assessment and plan- Patient is a 57 y.o. female h/o Satge I DLBCL double expressor s/p 3 cycles of RCHOP and IFRT currently in remission  Patient is very emotional today.  States her fatigue is interfering with her everyday activities.  She has been forced to miss several  days of work recently due to fatigue.  She is currently not taking any medications.  Previously on gabapentin but stopped due to "foggy feeling".  She denies any B symptoms.  Admits to weight gain even  though actively working out and dieting (herbal life).  No palpable adenopathy or splenomegaly on today's exam.  TSH slightly elevated  at 5.015.   Given she does not regularly see a PCP, will do a full fatigue work-up with the addition of T4, T3, CKMB, sedimentation rate and vitamin B12.   Labs returned and are all normal.   Spoke with Dr. Janese Banks, and given work-up was negative, will repeat PET scan to rule out recurrent lymphoma. We will call her with results.   Visit Diagnosis 1. Other fatigue   2. 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HCC)   3. Fatigue, unspecified type     Greater than 50% was spent in counseling and coordination of care with this patient including but not limited to discussion of the relevant topics above (See A&P) including, but not limited to diagnosis and management of acute and chronic medical conditions.   Faythe Casa, NP 01/30/2018 3:07 PM

## 2018-01-30 NOTE — Progress Notes (Signed)
Itching under her left breast and then today on right face-itching only-no active rash.  Fatigue is a major problem and in afternoon she has to take nap she is so tired. This weekend she had 2 nose bleeds-held pressure for a few minutes and it was over.  She gets worn out and feels dizzy and almost faint like at times.

## 2018-01-31 LAB — T4: T4, Total: 6.1 ug/dL (ref 4.5–12.0)

## 2018-01-31 LAB — T3: T3 TOTAL: 121 ng/dL (ref 71–180)

## 2018-02-01 ENCOUNTER — Telehealth: Payer: Self-pay | Admitting: *Deleted

## 2018-02-01 NOTE — Telephone Encounter (Signed)
Called pt and set up pet scan for 1 week 5/22 [facility] 8 [facility] to register and pet will start 8:30.  NPO after midnight. Be sure not to eat any sugar 8 hours prior due to sugar will need to be below 200 to do scan.  Patient has had PET scan in past and she understands what to to. We will contact pt after the scan for results

## 2018-02-06 ENCOUNTER — Other Ambulatory Visit: Payer: Self-pay | Admitting: Oncology

## 2018-02-06 DIAGNOSIS — C8331 Diffuse large B-cell lymphoma, lymph nodes of head, face, and neck: Secondary | ICD-10-CM

## 2018-02-06 DIAGNOSIS — R5383 Other fatigue: Secondary | ICD-10-CM

## 2018-02-07 ENCOUNTER — Ambulatory Visit: Payer: 59

## 2018-02-07 ENCOUNTER — Other Ambulatory Visit: Payer: Self-pay | Admitting: Oncology

## 2018-02-07 MED ORDER — MECLIZINE HCL 25 MG PO TABS: 25.0000 mg | ORAL_TABLET | Freq: Three times a day (TID) | ORAL | 0 refills | Status: DC | PRN

## 2018-02-08 ENCOUNTER — Ambulatory Visit: Payer: 59

## 2018-02-08 ENCOUNTER — Encounter: Payer: Self-pay | Admitting: Unknown Physician Specialty

## 2018-02-08 ENCOUNTER — Ambulatory Visit: Payer: 59 | Admitting: Unknown Physician Specialty

## 2018-02-08 VITALS — BP 116/73 | HR 80 | Temp 98.1°F | Ht 66.0 in | Wt 164.8 lb

## 2018-02-08 DIAGNOSIS — E039 Hypothyroidism, unspecified: Secondary | ICD-10-CM | POA: Diagnosis not present

## 2018-02-08 DIAGNOSIS — R232 Flushing: Secondary | ICD-10-CM

## 2018-02-08 DIAGNOSIS — H6590 Unspecified nonsuppurative otitis media, unspecified ear: Secondary | ICD-10-CM | POA: Insufficient documentation

## 2018-02-08 DIAGNOSIS — G4700 Insomnia, unspecified: Secondary | ICD-10-CM

## 2018-02-08 DIAGNOSIS — H6591 Unspecified nonsuppurative otitis media, right ear: Secondary | ICD-10-CM | POA: Diagnosis not present

## 2018-02-08 MED ORDER — CITALOPRAM HYDROBROMIDE 10 MG PO TABS: 10.0000 mg | ORAL_TABLET | Freq: Every day | ORAL | 1 refills | Status: DC

## 2018-02-08 NOTE — Assessment & Plan Note (Addendum)
Symptons consistent with serous otitis.  Start flonase 2 squirts each nostril daily

## 2018-02-08 NOTE — Assessment & Plan Note (Addendum)
Pt with last TSH of over 5 and symptomatic.  T3 and T4 are WNL.  I am willing to treat to see if she feels better but am concerned it would worsen already disabling hot flashes.  Start SSRI hot flashes.  Hopefully will provide mood stabilization as well

## 2018-02-08 NOTE — Progress Notes (Signed)
BP 116/73   Pulse 80   Temp 98.1 F (36.7 C) (Oral)   Ht 5\' 6"  (1.676 m)   Wt 164 lb 12.8 oz (74.8 kg)   LMP  (LMP Unknown)   SpO2 95%   BMI 26.60 kg/m    Subjective:    Patient ID: Christina Christina [name], Christina [name]    DOB: 12/12/1960, 57 y.o.   MRN: [id number]  HPI: Christina Christina [name] is a 57 y.o. Christina [name]  Chief Complaint  Patient presents with  . Hypothyroidism    pt states she is here to discuss her thyroid  . Ear Pain    pt states her R ear has been hurting since Saturday, states it popped and she has been feeling nauseous. Also states that ringing in her ear started Monday   Fatigue Pt states she went to her gyn doctor about 1 years ago with complaints of fatigue and weight gain.  Rechecked 3 months later it was checked,  TSH elevated and sent back to her oncologist for treatment.  Today she is frustrated with continuing fatigue, weight gain, and hot flashes.  She is exhausted just taking a shower.  Gyn not wanting to treat with hormones due to lymphoma.  Oncology has assessed her and feels symptoms not related to the lymphoma.    Ear Right ear lost her hearing, dizzy, and nauseated.  Having tinnitis and also difficulty hearing.  No nasal congestion.  Voice easily gets raspy.    Admits to being under a lot of stress with a new business.  States she is very emotional with an "up and down mood."  This is new for her.    Pt states the fatigue is overwhelming.  However not sleeping well due to hot flashes and thinkgs on her mind.   Depression screen [facility] 2/9 02/08/2018  Decreased Interest 0  Down, Depressed, Hopeless 0  PHQ - 2 Score 0  Altered sleeping 3  Tired, decreased energy 3  Change in appetite 3  Feeling bad or failure about yourself  0  Trouble concentrating 2  Moving slowly or fidgety/restless 3  Suicidal thoughts 0  PHQ-9 Score 14  Difficult doing work/chores Very difficult   Relevant past medical, surgical, family and social history reviewed and updated as indicated.  Interim medical history since our last visit reviewed. Allergies and medications reviewed and updated.  Review of Systems  Constitutional: Positive for fatigue. Negative for fever.  HENT: Positive for ear pain, hearing loss and voice change. Negative for sinus pressure and trouble swallowing.   Respiratory: Negative for apnea, cough and wheezing.   Cardiovascular: Negative for chest pain and palpitations.  Endocrine: Positive for heat intolerance.    Per HPI unless specifically indicated above     Objective:    BP 116/73   Pulse 80   Temp 98.1 F (36.7 C) (Oral)   Ht 5\' 6"  (1.676 m)   Wt 164 lb 12.8 oz (74.8 kg)   LMP  (LMP Unknown)   SpO2 95%   BMI 26.60 kg/m   Wt Readings from Last 3 Encounters:  02/08/18 164 lb 12.8 oz (74.8 kg)  01/30/18 165 lb (74.8 kg)  01/19/18 165 lb 4.8 oz (75 kg)    Physical Exam  Constitutional: She is oriented to person, place, and time. She appears well-developed and well-nourished. No distress.  HENT:  Head: Normocephalic and atraumatic.  Eyes: Conjunctivae and lids are normal. Right eye exhibits no discharge. Left eye exhibits no discharge. No scleral  icterus.  Neck: Normal range of motion. Neck supple. No JVD present. Carotid bruit is not present.  Cardiovascular: Normal rate, regular rhythm and normal heart sounds.  Pulmonary/Chest: Effort normal and breath sounds normal.  Abdominal: Normal appearance. There is no splenomegaly or hepatomegaly.  Musculoskeletal: Normal range of motion.  Neurological: She is alert and oriented to person, place, and time.  Skin: Skin is warm, dry and intact. No rash noted. No pallor.  Psychiatric: She has a normal mood and affect. Her behavior is normal. Judgment and thought content normal.    Results for orders placed or performed in visit on 01/30/18  T4  Result Value Ref Range   T4, Total 6.1 4.5 - 12.0 ug/dL  Sedimentation rate  Result Value Ref Range   Sed Rate 18 0 - 30 mm/hr  Vitamin B12    Result Value Ref Range   Vitamin B-12 479 180 - 914 pg/mL  T3  Result Value Ref Range   T3, Total 121 71 - 180 ng/dL  CKMB([facility] only)  Result Value Ref Range   CK, MB 1.2 0.5 - 5.0 ng/mL      Assessment & Plan:   Problem List Items Addressed This Visit      Unprioritized   Hypothyroid    Pt with last TSH of over 5 and symptomatic.  T3 and T4 are WNL.  I am willing to treat to see if she feels better but am concerned it would worsen already disabling hot flashes.  Start SSRI hot flashes.  Hopefully will provide mood stabilization as well      Otitis media, serous    Symptons consistent with serous otitis.  Start flonase 2 squirts each nostril daily       Other Visit Diagnoses    Hot flashes    -  Primary   Hormone treatment controversial due to lymphoma diagnoses.  Start Citalopram at low dose.  Pt claims to be very sensitive   Insomnia, unspecified type       Will see if Citalopram helps with hot flashes and will improve sleep       Follow up plan: Return in about 5 weeks (around 03/15/2018).

## 2018-02-09 IMAGING — CR DG ABDOMEN 2V
2 series · 2 of 2 positions shown · non-contrast
Comparison: 11/21/2016

CLINICAL DATA: Abdominal pain for 4 days, history of stomach
ulcers, lymphoma, GERD

EXAM:
ABDOMEN - 2 VIEW

[abdomen erect]
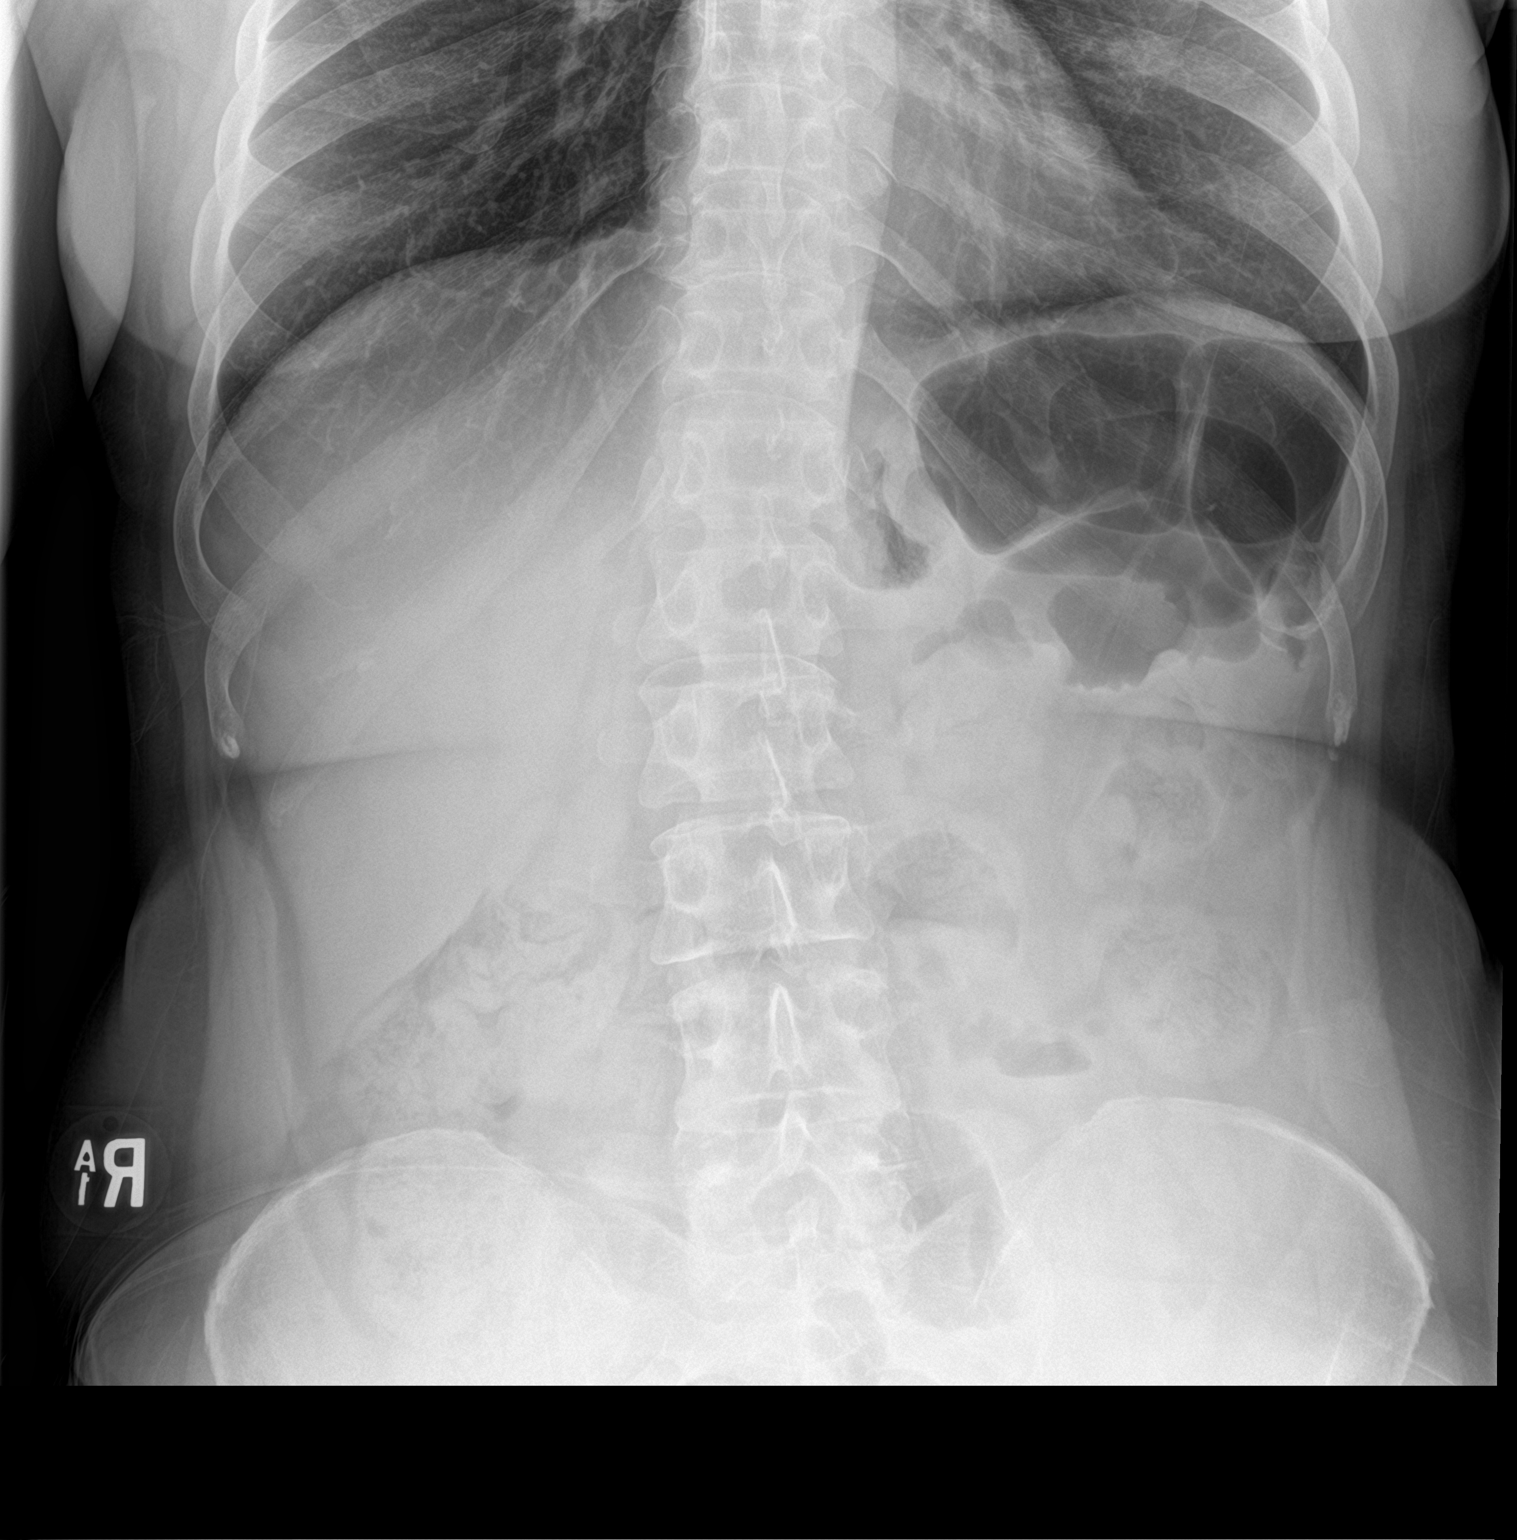

[abdomen supine]
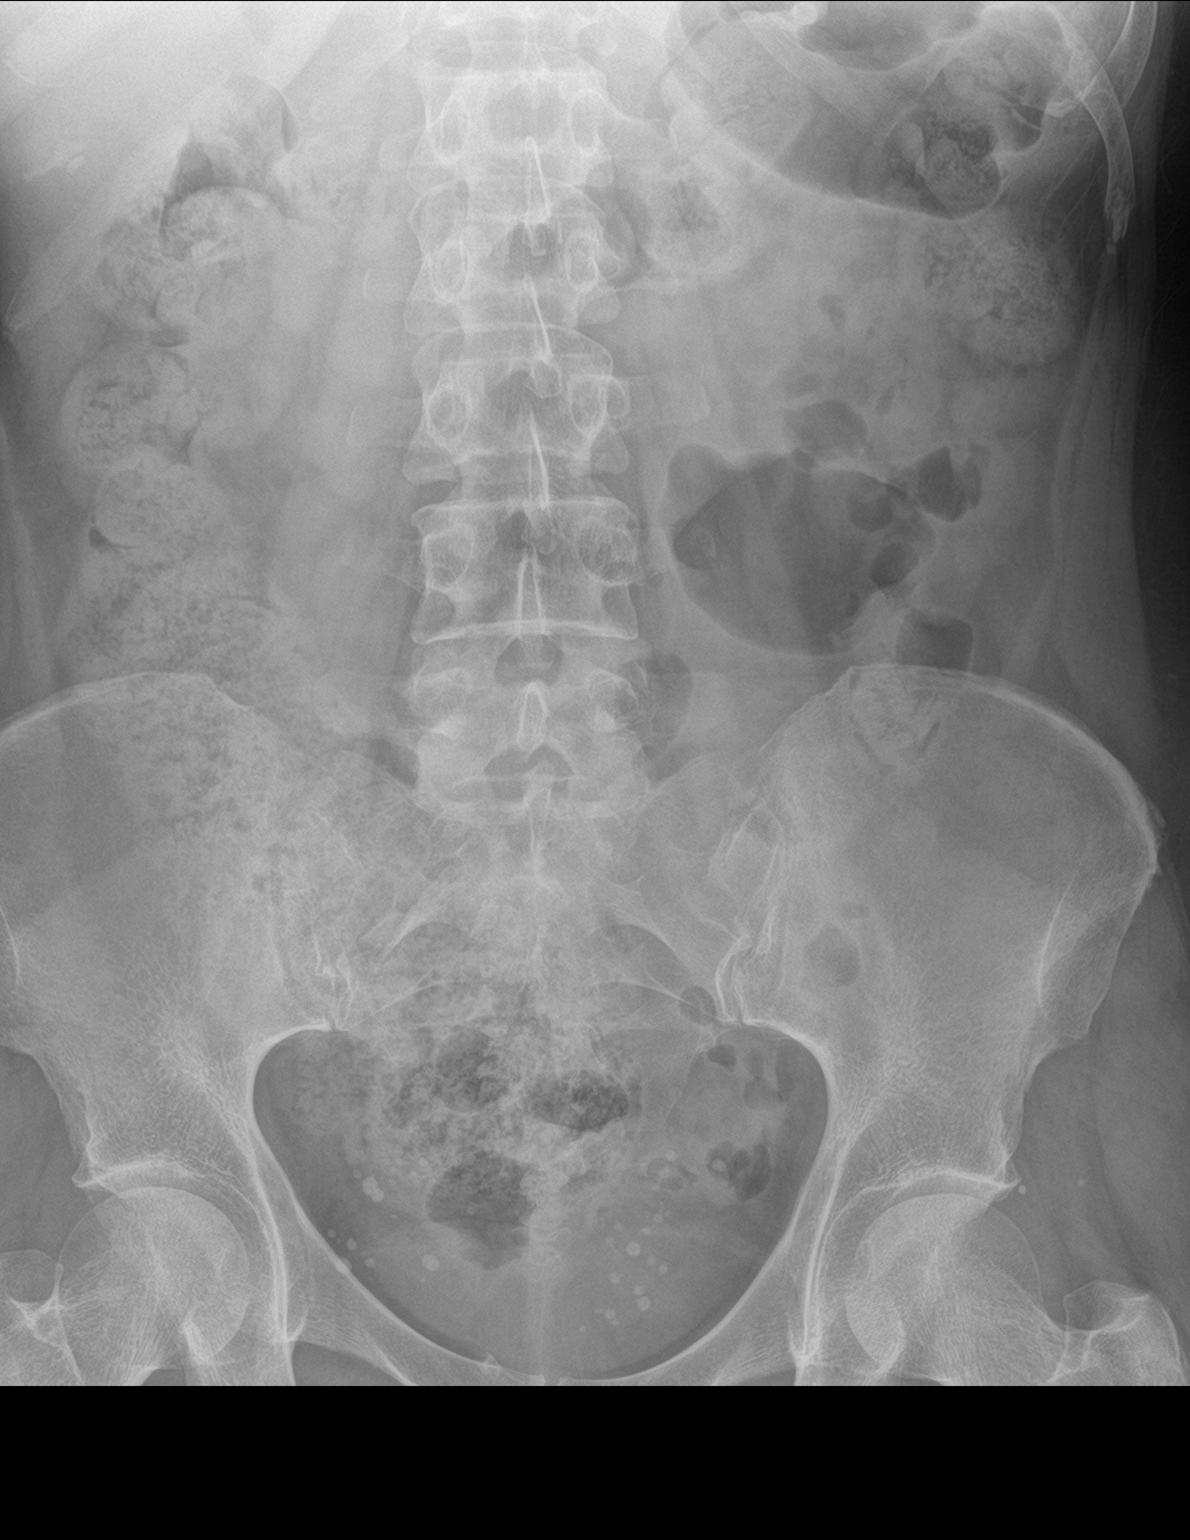

[2 of 2 positions shown; findings below may reference images not displayed]

FINDINGS: Lung bases clear.

Nonobstructive bowel gas pattern.

Slightly increased stool in ascending colon.

Overall decreased stool burden versus prior study.

No bowel dilatation or bowel wall thickening.

No free intraperitoneal air.

Numerous pelvic phleboliths.

Bones demineralized.

No urine tract calcification.
IMPRESSION: Mildly increased stool in RIGHT colon.

Decreased stool burden since previous study.

## 2018-02-24 ENCOUNTER — Telehealth: Payer: Self-pay | Admitting: Unknown Physician Specialty

## 2018-02-24 MED ORDER — LEVOTHYROXINE SODIUM 50 MCG PO TABS
50.0000 ug | ORAL_TABLET | Freq: Every day | ORAL | 0 refills | Status: DC
Start: 2018-02-24 — End: 2018-05-18

## 2018-02-24 NOTE — Telephone Encounter (Signed)
OK.  Called in 50 mcgs.  Recheck in 2-3 months

## 2018-02-24 NOTE — Telephone Encounter (Signed)
Copied from [place] [id number]. Topic: Quick Communication - Rx Refill/Question >> Feb 24, 2018  2:11 PM Scherrie Gerlach wrote: Medication: a thyroid med  Pt states the med cheryl put her on is working great, so she would like to start the thyroid medicine that was discussed at last visit.  [id number] [address], [place] [phone] (Phone) [phone] (Fax)

## 2018-02-24 NOTE — Telephone Encounter (Signed)
Patient notified

## 2018-03-04 ENCOUNTER — Other Ambulatory Visit: Payer: Self-pay | Admitting: Unknown Physician Specialty

## 2018-03-06 NOTE — Telephone Encounter (Signed)
citalopram refill Last Refill:02/08/18 # 30 tab 1 RF Last OV: 02/08/18 (has upcoming visit 03/21/18 PCP: [name] NP Pharmacy:[facility] [address]

## 2018-03-21 ENCOUNTER — Other Ambulatory Visit: Payer: Self-pay

## 2018-03-21 ENCOUNTER — Encounter: Payer: Self-pay | Admitting: Unknown Physician Specialty

## 2018-03-21 ENCOUNTER — Ambulatory Visit (INDEPENDENT_AMBULATORY_CARE_PROVIDER_SITE_OTHER): Payer: 59 | Admitting: Unknown Physician Specialty

## 2018-03-21 DIAGNOSIS — R232 Flushing: Secondary | ICD-10-CM

## 2018-03-21 DIAGNOSIS — E039 Hypothyroidism, unspecified: Secondary | ICD-10-CM | POA: Diagnosis not present

## 2018-03-21 DIAGNOSIS — N951 Menopausal and female climacteric states: Secondary | ICD-10-CM | POA: Insufficient documentation

## 2018-03-21 NOTE — Assessment & Plan Note (Addendum)
Recheck TSH in 1 month.  Order through Oncology already written.  Discussed that adjusting to correct dose may takes a period of time.  Goal TSH is less 2.5.

## 2018-03-21 NOTE — Assessment & Plan Note (Signed)
Doing well on very low dose Citalopram (about 5 mg).

## 2018-03-21 NOTE — Progress Notes (Signed)
BP 119/79   Pulse (!) 57   Temp 98.1 F (36.7 C) (Oral)   Ht 5\' 6"  (1.676 m)   Wt 162 lb 12.8 oz (73.8 kg)   LMP  (LMP Unknown)   SpO2 96%   BMI 26.28 kg/m    Subjective:    Patient ID: Christina [name], female    DOB: 1961-07-23, 57 y.o.   MRN: [id number]  HPI: Christina [name] is a 57 y.o. female  Chief Complaint  Patient presents with  . Follow-up    pt states since started thyroid medication she still has fatigue occational palpitation and nausea   Hot flashes A chief complaint last visit.  This has improved with Citalopram 10 mg started 2 months ago and has none at night and having it twice a day.  Taking a 1/2 pill.    Hypothyroid Started pt on a trial of Levothyroxine .5mg .  Having occasional palpitation.  No improvement yet on her fatigue.  Feels "wiped out" at 3 p.  She has lost 2 pounds from last visit.    Relevant past medical, surgical, family and social history reviewed and updated as indicated. Interim medical history since our last visit reviewed. Allergies and medications reviewed and updated.  Review of Systems  Per HPI unless specifically indicated above     Objective:    BP 119/79   Pulse (!) 57   Temp 98.1 F (36.7 C) (Oral)   Ht 5\' 6"  (1.676 m)   Wt 162 lb 12.8 oz (73.8 kg)   LMP  (LMP Unknown)   SpO2 96%   BMI 26.28 kg/m   Wt Readings from Last 3 Encounters:  03/21/18 162 lb 12.8 oz (73.8 kg)  02/08/18 164 lb 12.8 oz (74.8 kg)  01/30/18 165 lb (74.8 kg)    Physical Exam  Constitutional: She is oriented to person, place, and time. She appears well-developed and well-nourished. No distress.  HENT:  Head: Normocephalic and atraumatic.  Eyes: Conjunctivae and lids are normal. Right eye exhibits no discharge. Left eye exhibits no discharge. No scleral icterus.  Neck: Normal range of motion. Neck supple. No JVD present. Carotid bruit is not present.  Cardiovascular: Normal rate, regular rhythm and normal heart sounds.  Pulmonary/Chest: Effort  normal and breath sounds normal.  Abdominal: Normal appearance. There is no splenomegaly or hepatomegaly.  Musculoskeletal: Normal range of motion.  Neurological: She is alert and oriented to person, place, and time.  Skin: Skin is warm, dry and intact. No rash noted. No pallor.  Psychiatric: She has a normal mood and affect. Her behavior is normal. Judgment and thought content normal.    Results for orders placed or performed in visit on 01/30/18  T4  Result Value Ref Range   T4, Total 6.1 4.5 - 12.0 ug/dL  Sedimentation rate  Result Value Ref Range   Sed Rate 18 0 - 30 mm/hr  Vitamin B12  Result Value Ref Range   Vitamin B-12 479 180 - 914 pg/mL  T3  Result Value Ref Range   T3, Total 121 71 - 180 ng/dL  CKMB([facility] only)  Result Value Ref Range   CK, MB 1.2 0.5 - 5.0 ng/mL      Assessment & Plan:   Problem List Items Addressed This Visit      Unprioritized   Hot flashes    Doing well on very low dose Citalopram (about 5 mg).        Hypothyroid    Recheck TSH  in 1 month.  Order through Oncology already written.  Discussed that adjusting to correct dose may takes a period of time.  Goal TSH is less 2.5.            Follow up plan: Return in about 3 months (around 06/21/2018).

## 2018-04-20 ENCOUNTER — Other Ambulatory Visit: Payer: 59

## 2018-04-20 ENCOUNTER — Ambulatory Visit: Payer: 59 | Admitting: Oncology

## 2018-04-25 ENCOUNTER — Inpatient Hospital Stay (HOSPITAL_BASED_OUTPATIENT_CLINIC_OR_DEPARTMENT_OTHER): Payer: 59 | Admitting: Oncology

## 2018-04-25 ENCOUNTER — Encounter: Payer: Self-pay | Admitting: Oncology

## 2018-04-25 ENCOUNTER — Inpatient Hospital Stay: Payer: 59 | Attending: Oncology

## 2018-04-25 VITALS — BP 119/81 | HR 50 | Temp 98.0°F | Resp 18 | Ht 66.0 in | Wt 167.5 lb

## 2018-04-25 DIAGNOSIS — Z8579 Personal history of other malignant neoplasms of lymphoid, hematopoietic and related tissues: Secondary | ICD-10-CM

## 2018-04-25 DIAGNOSIS — C8331 Diffuse large B-cell lymphoma, lymph nodes of head, face, and neck: Secondary | ICD-10-CM

## 2018-04-25 DIAGNOSIS — E039 Hypothyroidism, unspecified: Secondary | ICD-10-CM | POA: Diagnosis not present

## 2018-04-25 DIAGNOSIS — Z9221 Personal history of antineoplastic chemotherapy: Secondary | ICD-10-CM

## 2018-04-25 DIAGNOSIS — Z08 Encounter for follow-up examination after completed treatment for malignant neoplasm: Secondary | ICD-10-CM

## 2018-04-25 LAB — CBC WITH DIFFERENTIAL/PLATELET
Basophils Absolute: 0 10*3/uL (ref 0–0.1)
Basophils Relative: 1 %
EOS PCT: 1 %
Eosinophils Absolute: 0 10*3/uL (ref 0–0.7)
HCT: 38.2 % (ref 35.0–47.0)
HEMOGLOBIN: 12.9 g/dL (ref 12.0–16.0)
LYMPHS ABS: 1 10*3/uL (ref 1.0–3.6)
LYMPHS PCT: 20 %
MCH: 30.4 pg (ref 26.0–34.0)
MCHC: 33.7 g/dL (ref 32.0–36.0)
MCV: 90.3 fL (ref 80.0–100.0)
Monocytes Absolute: 0.4 10*3/uL (ref 0.2–0.9)
Monocytes Relative: 8 %
Neutro Abs: 3.5 10*3/uL (ref 1.4–6.5)
Neutrophils Relative %: 70 %
PLATELETS: 242 10*3/uL (ref 150–440)
RBC: 4.23 MIL/uL (ref 3.80–5.20)
RDW: 12.6 % (ref 11.5–14.5)
WBC: 4.9 10*3/uL (ref 3.6–11.0)

## 2018-04-25 LAB — TSH: TSH: 2.587 u[IU]/mL (ref 0.350–4.500)

## 2018-04-25 LAB — COMPREHENSIVE METABOLIC PANEL
ALK PHOS: 92 U/L (ref 38–126)
ALT: 15 U/L (ref 0–44)
AST: 18 U/L (ref 15–41)
Albumin: 4.2 g/dL (ref 3.5–5.0)
Anion gap: 7 (ref 5–15)
BUN: 12 mg/dL (ref 6–20)
CALCIUM: 9.4 mg/dL (ref 8.9–10.3)
CO2: 25 mmol/L (ref 22–32)
CREATININE: 0.91 mg/dL (ref 0.44–1.00)
Chloride: 106 mmol/L (ref 98–111)
Glucose, Bld: 98 mg/dL (ref 70–99)
Potassium: 4.4 mmol/L (ref 3.5–5.1)
SODIUM: 138 mmol/L (ref 135–145)
Total Bilirubin: 0.4 mg/dL (ref 0.3–1.2)
Total Protein: 7.1 g/dL (ref 6.5–8.1)

## 2018-04-25 LAB — LACTATE DEHYDROGENASE: LDH: 123 U/L (ref 98–192)

## 2018-04-25 NOTE — Progress Notes (Signed)
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 Patient is a 57 year old female who presented to her primary care doctor with symptoms of right neck swelling in January 2018. Her symptoms of an ongoing since December 2017. Patient works at a [employer] (medial in the swelling but the swelling did not go away. She was then referred to ENT.   2. CT of the soft tissue neck with contrast showed malignant right leg lymphadenopathy with heterogeneous enhancement and extracapsular extension occupying both the right level II and level III nodal stations. Individually B abnormal lymph nodes measure up to 4.2 cm in largest dimension and the conglomerulationof abnormal lymph nodes is 5-5.5 cm across. There is a small but asymmetric and conspicuity 7 mm lymph node along the lower right 3-Bstation. Mass effect on the right carotid space from the abnormal nodes but the major vascular structures in the neck at the skull base including the right IJ remained patent.  3. 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 Cells were CD20 positive and BCL 6 and partially dim BCL-2 positive. B cells were negative for C myc (10%). Mom one was positive in 50% of the cells. Ki-67 was 50%.Bone marrow biopsy was negative for lymphoma involvement  4. PET/CT showed IMPRESSION: 1. FDG avid malignancy is identified in the level 2 and 3 nodal stations of the right cervical lymph node chain, consistent with the patient's known lymphoma. 2. Focal  uptake in the gastric cardia is suspicious for malignancy as well. Recommend direct visualization and biopsy as clinically Warranted.  5. EGD showed diffuse moderately erythematous mucosa without bleeding initial nonbleeding gastric ulcer 6 mm which was biopsied. Findings were consistent with H. pylori gastritis and patient was started on antibiotictherapy for the same. Repeat H pylori testing negative  6. EUS also showed H pylori. There were few B cells noted in lamina propria and clonality studies have been ordered. Initial testing for B cell clonality showed no monoclonal B-cell population. Confirmation testing also did not reveal clonal B cells  7. PET/CT scan after cycles of RCHOP showed: IMPRESSION: 1. Complete metabolic response. No metabolically active lymphoma. 2. Uniform low-level marrow hypermetabolism throughout the axial skeleton, compatible with reactive marrow state due to chemotherapy. 3. No residual gastric hypermetabolism  8. She completed 3 cycles of RCHOP between feb 2018-aoril 2018 followed by IFRT to her neck in June 2018  Interval history- she was having ongoing fatigue over last 2-3 months. She reports feeling very tired especially between 2-3 pm and then feeling better after that. She has been started on levothyroxine by her pcp. She is concerned about her weight gain  ECOG PS- 0 Pain scale- 0   Review of systems- Review of Systems  Constitutional: Positive for malaise/fatigue. Negative for chills, fever and weight loss.       Weight gain  HENT: Negative for congestion, ear discharge and nosebleeds.   Eyes: Negative for blurred vision.  Respiratory: Negative for cough, hemoptysis, sputum production, shortness of breath and wheezing.   Cardiovascular: Negative for chest pain, palpitations,  orthopnea and claudication.  Gastrointestinal: Negative for abdominal pain, blood in stool, constipation, diarrhea, heartburn, melena, nausea and vomiting.    Genitourinary: Negative for dysuria, flank pain, frequency, hematuria and urgency.  Musculoskeletal: Negative for back pain, joint pain and myalgias.  Skin: Negative for rash.  Neurological: Negative for dizziness, tingling, focal weakness, seizures, weakness and headaches.  Endo/Heme/Allergies: Does not bruise/bleed easily.  Psychiatric/Behavioral: Negative for depression and suicidal ideas. The patient does not have insomnia.       No Known Allergies   Past Medical History:  Diagnosis Date  . B-cell lymphoma ([place])   . Cancer (HCC)    lymphoma, non hodgekins B cell  . GERD (gastroesophageal reflux disease)      Past Surgical History:  Procedure Laterality Date  . ABDOMINAL HYSTERECTOMY    . CESAREAN SECTION     x 2   . COLONOSCOPY WITH PROPOFOL N/A 09/28/2017   Procedure: COLONOSCOPY WITH PROPOFOL;  Surgeon: Jonathon Bellows, MD;  Location: [facility] ENDOSCOPY;  Service: Gastroenterology;  Laterality: N/A;  . ESOPHAGOGASTRODUODENOSCOPY (EGD) WITH PROPOFOL N/A 11/11/2016   Procedure: ESOPHAGOGASTRODUODENOSCOPY (EGD) WITH PROPOFOL;  Surgeon: Jonathon Bellows, MD;  Location: [facility] ENDOSCOPY;  Service: Endoscopy;  Laterality: N/A;  . IR GENERIC HISTORICAL  11/03/2016   IR FLUORO GUIDE PORT INSERTION RIGHT 11/03/2016 Aletta Edouard, MD [facility]-INTERV RAD  . IR REMOVAL TUN ACCESS W/ PORT W/O FL MOD SED  02/15/2017  . REFRACTIVE SURGERY  09/2015    Social History   Socioeconomic History  . Marital status: Single    Spouse name: Not on file  . Number of children: Not on file  . Years of education: Not on file  . Highest education level: Not on file  Occupational History  . Not on file  Social Needs  . Financial resource strain: Not on file  . Food insecurity:    Worry: Not on file    Inability: Not on file  . Transportation needs:    Medical: Not on file    Non-medical: Not on file  Tobacco Use  . Smoking status: Never Smoker  . Smokeless tobacco: Never Used  Substance and Sexual Activity   . Alcohol use: No  . Drug use: No  . Sexual activity: Yes  Lifestyle  . Physical activity:    Days per week: Not on file    Minutes per session: Not on file  . Stress: Not on file  Relationships  . Social connections:    Talks on phone: Not on file    Gets together: Not on file    Attends religious service: Not on file    Active member of club or organization: Not on file    Attends meetings of clubs or organizations: Not on file    Relationship status: Not on file  . Intimate partner violence:    Fear of current or ex partner: Not on file    Emotionally abused: Not on file    Physically abused: Not on file    Forced sexual activity: Not on file  Other Topics Concern  . Not on file  Social History Narrative  . Not on file    Family History  Problem Relation Age of Onset  . Cancer Mother        breast  . Cancer Father        lung  . Heart disease Brother 50       MI  . Heart disease Paternal Grandfather  MI     Current Outpatient Medications:  .  levothyroxine (SYNTHROID, LEVOTHROID) 50 MCG tablet, Take 1 tablet (50 mcg total) by mouth daily., Disp: 90 tablet, Rfl: 0 .  citalopram (CELEXA) 10 MG tablet, TAKE 1 TABLET BY MOUTH EVERY DAY (Patient not taking: Reported on 04/25/2018), Disp: 90 tablet, Rfl: 1 .  gabapentin (NEURONTIN) 100 MG capsule, Take 100 mg by mouth at bedtime., Disp: , Rfl:  .  lidocaine-prilocaine (EMLA) cream, as needed., Disp: , Rfl:  .  meclizine (ANTIVERT) 25 MG tablet, Take 1 tablet (25 mg total) by mouth 3 (three) times daily as needed for dizziness. (Patient not taking: Reported on 03/21/2018), Disp: 30 tablet, Rfl: 0  Physical exam:  Vitals:   04/25/18 1031  BP: 119/81  Pulse: (!) 50  Resp: 18  Temp: 98 F (36.7 C)  TempSrc: Tympanic  SpO2: 97%  Weight: 167 lb 8 oz (76 kg)  Height: '5\' 6"'  (1.676 m)   Physical Exam  Constitutional: She is oriented to person, place, and time. She appears well-developed and well-nourished.  HENT:   Head: Normocephalic and atraumatic.  Eyes: Pupils are equal, round, and reactive to light. EOM are normal.  Neck: Normal range of motion.  Cardiovascular: Normal rate, regular rhythm and normal heart sounds.  Pulmonary/Chest: Effort normal and breath sounds normal.  Abdominal: Soft. Bowel sounds are normal.  Lymphadenopathy:  No palpable cervical, supraclavicular, axillary or inguinal adenopathy   Neurological: She is alert and oriented to person, place, and time.  Skin: Skin is warm and dry.     CMP Latest Ref Rng & Units 04/25/2018  Glucose 70 - 99 mg/dL 98  BUN 6 - 20 mg/dL 12  Creatinine 0.44 - 1.00 mg/dL 0.91  Sodium 135 - 145 mmol/L 138  Potassium 3.5 - 5.1 mmol/L 4.4  Chloride 98 - 111 mmol/L 106  CO2 22 - 32 mmol/L 25  Calcium 8.9 - 10.3 mg/dL 9.4  Total Protein 6.5 - 8.1 g/dL 7.1  Total Bilirubin 0.3 - 1.2 mg/dL 0.4  Alkaline Phos 38 - 126 U/L 92  AST 15 - 41 U/L 18  ALT 0 - 44 U/L 15   CBC Latest Ref Rng & Units 04/25/2018  WBC 3.6 - 11.0 K/uL 4.9  Hemoglobin 12.0 - 16.0 g/dL 12.9  Hematocrit 35.0 - 47.0 % 38.2  Platelets 150 - 440 K/uL 242     Assessment and plan- Patient is a 57 y.o. 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  She is here for routine follow-up of a lymphoma  Clinically she is doing well and there is no evidence of recurrence on today's exam.  No palpable adenopathy.  She does report fatigue but has also had weight gain.  No night sweats.  If she continues to have overwhelming fatigue I will consider doing replete scans but at this time no scans are warranted.  She will continue to follow-up with her primary care doctor for hypothyroidism.  She did have some hot flashes and was taking citalopram in the past but she has stopped taking it at this time  I will see her back in 6 months with CBC CMP and LDH.  She will call us in the interim if she has any questions or concerns   Visit Diagnosis 1.  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 Encounter for follow-up surveillance of diffuse large B-cell lymphoma  Dr. Randa Evens, MD, MPH [facility] [phone] 04/25/2018 2:22 PM

## 2018-04-25 NOTE — Progress Notes (Signed)
No new changes noted today 

## 2018-05-01 ENCOUNTER — Encounter: Payer: Self-pay | Admitting: Oncology

## 2018-05-18 ENCOUNTER — Other Ambulatory Visit: Payer: Self-pay | Admitting: Unknown Physician Specialty

## 2018-06-22 ENCOUNTER — Encounter: Payer: Self-pay | Admitting: Family Medicine

## 2018-06-22 ENCOUNTER — Ambulatory Visit: Payer: 59 | Admitting: Family Medicine

## 2018-06-22 ENCOUNTER — Other Ambulatory Visit: Payer: Self-pay

## 2018-06-22 VITALS — BP 112/77 | HR 67 | Temp 98.2°F | Ht 66.0 in | Wt 165.0 lb

## 2018-06-22 DIAGNOSIS — R232 Flushing: Secondary | ICD-10-CM | POA: Diagnosis not present

## 2018-06-22 DIAGNOSIS — E663 Overweight: Secondary | ICD-10-CM | POA: Diagnosis not present

## 2018-06-22 DIAGNOSIS — E039 Hypothyroidism, unspecified: Secondary | ICD-10-CM | POA: Diagnosis not present

## 2018-06-22 MED ORDER — VENLAFAXINE HCL ER 75 MG PO CP24: 75.0000 mg | ORAL_CAPSULE | Freq: Every day | ORAL | 2 refills | Status: DC

## 2018-06-22 MED ORDER — LEVOTHYROXINE SODIUM 75 MCG PO TABS: 75.0000 ug | ORAL_TABLET | Freq: Every day | ORAL | 0 refills | Status: DC

## 2018-06-22 NOTE — Progress Notes (Signed)
BP 112/77   Pulse 67   Temp 98.2 F (36.8 C) (Oral)   Ht 5\' 6"  (1.676 m)   Wt 165 lb (74.8 kg)   LMP  (LMP Unknown)   SpO2 95%   BMI 26.63 kg/m    Subjective:    Patient ID: Christina [name], female    DOB: September 12, 1961, 57 y.o.   MRN: [id number]  HPI: Christina [name] is a 57 y.o. female  Chief Complaint  Patient presents with  . Hypothyroidism    f/u  . Hot Flashes    pt states medications are not working well and making her gain weight   Here today for hypothyroid, weight, and hot flash f/u. Does not feel things are improving. Sleeping better.   Working on decreasing portion sizes but not exercising. Weight has remained stable.   Tried gabapentin in the past for her hot flashes with sedation side effects. Also tried celexa which she thought helped initially but not long term. These hot flashes started about a year ago and are very bothersome for her.   Started on 50 mcg synthroid recently and feels maybe some improvement in sxs since but hoping to increase further. No recent palpitations.   Past Medical History:  Diagnosis Date  . 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 Cancer ([facility])    lymphoma, non hodgekins B cell  . GERD (gastroesophageal reflux disease)    Social History   Socioeconomic History  . Marital status: Single    Spouse name: Not on file  . Number of children: Not on file  . Years of education: Not on file  . Highest education level: Not on file  Occupational History  . Not on file  Social Needs  . Financial resource strain: Not on file  . Food insecurity:    Worry: Not on file    Inability: Not on file  . Transportation needs:    Medical: Not on file    Non-medical: Not on file  Tobacco Use  . Smoking status: Never Smoker  . Smokeless tobacco: Never Used  Substance and Sexual Activity  . Alcohol use: No  . Drug use: No  . Sexual activity: Yes  Lifestyle  . Physical activity:    Days per week: Not on file    Minutes per session: Not on file  .  Stress: Not on file  Relationships  . Social connections:    Talks on phone: Not on file    Gets together: Not on file    Attends religious service: Not on file    Active member of club or organization: Not on file    Attends meetings of clubs or organizations: Not on file    Relationship status: Not on file  . Intimate partner violence:    Fear of current or ex partner: Not on file    Emotionally abused: Not on file    Physically abused: Not on file    Forced sexual activity: Not on file  Other Topics Concern  . Not on file  Social History Narrative  . Not on file    Relevant past medical, surgical, family and social history reviewed and updated as indicated. Interim medical history since our last visit reviewed. Allergies and medications reviewed and updated.  Review of Systems  Per HPI unless specifically indicated above     Objective:    BP 112/77   Pulse 67   Temp 98.2 F (36.8 C) (Oral)   Ht 5\' 6"  (  1.676 m)   Wt 165 lb (74.8 kg)   LMP  (LMP Unknown)   SpO2 95%   BMI 26.63 kg/m   Wt Readings from Last 3 Encounters:  06/22/18 165 lb (74.8 kg)  04/25/18 167 lb 8 oz (76 kg)  03/21/18 162 lb 12.8 oz (73.8 kg)    Physical Exam  Constitutional: She is oriented to person, place, and time. She appears well-developed and well-nourished. No distress.  HENT:  Head: Atraumatic.  Eyes: Conjunctivae and EOM are normal.  Neck: Normal range of motion. Neck supple.  Cardiovascular: Normal rate and regular rhythm.  Pulmonary/Chest: Effort normal and breath sounds normal.  Musculoskeletal: Normal range of motion.  Lymphadenopathy:    She has no cervical adenopathy.  Neurological: She is alert and oriented to person, place, and time.  Skin: Skin is warm and dry.  Psychiatric: She has a normal mood and affect. Her behavior is normal.  Nursing note and vitals reviewed.   Results for orders placed or performed in visit on 06/22/18  TSH  Result Value Ref Range   TSH  1.750 0.450 - 4.500 uIU/mL      Assessment & Plan:   Problem List Items Addressed This Visit      Cardiovascular and Mediastinum   Hot flashes    D/c celexa, will switch to effexor and monitor closely for benefit. If not improving may trial estrogen supplementation        Endocrine   Hypothyroid - Primary    Will consider increasing synthroid dose depending on levels today on recheck. For now continue current dose      Relevant Orders   TSH (Completed)    Other Visit Diagnoses    Overweight (BMI 25.0-29.9)       Pt very concerned about her inability to lose weight. Cont portion decreases, add in exercise as tolerated. Cont to monitor       Follow up plan: Return in about 3 months (around 09/22/2018) for Hot flash f/u.

## 2018-06-23 ENCOUNTER — Other Ambulatory Visit: Payer: Self-pay | Admitting: Family Medicine

## 2018-06-23 LAB — TSH: TSH: 1.75 u[IU]/mL (ref 0.450–4.500)

## 2018-06-23 MED ORDER — LEVOTHYROXINE SODIUM 50 MCG PO TABS: 50.0000 ug | ORAL_TABLET | Freq: Every day | ORAL | 0 refills | Status: DC

## 2018-07-14 ENCOUNTER — Other Ambulatory Visit: Payer: Self-pay | Admitting: Family Medicine

## 2018-07-14 NOTE — Telephone Encounter (Signed)
Requested medication (s) are due for refill today: No  Requested medication (s) are on the active medication list: yes  Last refill:  06/22/18   #30  Daneil Dolin  Future visit scheduled: yes 09/22/18  Notes to clinic:  Pharmacy requesting 90 day RX    Requested Prescriptions  Pending Prescriptions Disp Refills   venlafaxine XR (EFFEXOR-XR) 75 MG 24 hr capsule [Pharmacy Med Name: VENLAFAXINE HCL ER 75 MG CAP] 90 capsule 1    Sig: Take 1 capsule (75 mg total) by mouth daily with breakfast.     Psychiatry: Antidepressants - SNRI - desvenlafaxine & venlafaxine Failed - 07/14/2018 10:32 AM      Failed - LDL in normal range and within 360 days    No results found for: LDLCALC, LDLC, HIRISKLDL       Failed - Total Cholesterol in normal range and within 360 days    No results found for: CHOL, POCCHOL       Failed - Triglycerides in normal range and within 360 days    No results found for: TRIG       Passed - Last BP in normal range    BP Readings from Last 1 Encounters:  06/22/18 112/77         Passed - Valid encounter within last 6 months    Recent Outpatient Visits          3 weeks ago Hypothyroidism, unspecified type   [facility] Volney American, PA-C   3 months ago Hypothyroidism, unspecified type   [facility] [name], NP   5 months ago Hot flashes   [facility] [name], NP   1 year ago Need for diphtheria-tetanus-pertussis (Tdap) vaccine, adult/adolescent   [facility] [name], NP      Future Appointments            In 2 months Cannady, Barbaraann Faster, NP [employer]

## 2018-07-16 NOTE — Assessment & Plan Note (Signed)
Will consider increasing synthroid dose depending on levels today on recheck. For now continue current dose

## 2018-07-16 NOTE — Assessment & Plan Note (Addendum)
D/c celexa, will switch to effexor and monitor closely for benefit. If not improving may trial estrogen supplementation

## 2018-07-16 NOTE — Patient Instructions (Signed)
Follow up in 3 months

## 2018-09-01 ENCOUNTER — Other Ambulatory Visit: Payer: Self-pay | Admitting: Unknown Physician Specialty

## 2018-09-10 ENCOUNTER — Other Ambulatory Visit: Payer: Self-pay | Admitting: Unknown Physician Specialty

## 2018-09-12 ENCOUNTER — Other Ambulatory Visit: Payer: Self-pay | Admitting: Family Medicine

## 2018-09-21 ENCOUNTER — Encounter: Payer: Self-pay | Admitting: Nurse Practitioner

## 2018-09-22 ENCOUNTER — Encounter: Payer: Self-pay | Admitting: Nurse Practitioner

## 2018-09-22 ENCOUNTER — Ambulatory Visit: Payer: 59 | Admitting: Nurse Practitioner

## 2018-09-22 VITALS — BP 135/83 | HR 64 | Temp 97.3°F | Ht 66.0 in | Wt 171.0 lb

## 2018-09-22 DIAGNOSIS — Z1239 Encounter for other screening for malignant neoplasm of breast: Secondary | ICD-10-CM | POA: Diagnosis not present

## 2018-09-22 DIAGNOSIS — E039 Hypothyroidism, unspecified: Secondary | ICD-10-CM

## 2018-09-22 DIAGNOSIS — N951 Menopausal and female climacteric states: Secondary | ICD-10-CM | POA: Diagnosis not present

## 2018-09-22 MED ORDER — ESCITALOPRAM OXALATE 5 MG PO TABS: 5.0000 mg | ORAL_TABLET | Freq: Every day | ORAL | 3 refills | Status: DC

## 2018-09-22 NOTE — Assessment & Plan Note (Signed)
Thyroid panel today.  Adjust dose as needed.

## 2018-09-22 NOTE — Patient Instructions (Addendum)
Please call [place] @ [phone] to schedule your mammogram.   Menopause Menopause is the normal time of life when menstrual periods stop completely. It is usually confirmed by 12 months without a menstrual period. The transition to menopause (perimenopause) most often happens between the ages of 28 and 28. During perimenopause, hormone levels change in your body, which can cause symptoms and affect your health. Menopause may increase your risk for:  Loss of bone (osteoporosis), which causes bone breaks (fractures).  Depression.  Hardening and narrowing of the arteries (atherosclerosis), which can cause heart attacks and strokes. What are the causes? This condition is usually caused by a natural change in hormone levels that happens as you get older. The condition may also be caused by surgery to remove both ovaries (bilateral oophorectomy). What increases the risk? This condition is more likely to start at an earlier age if you have certain medical conditions or treatments, including:  A tumor of the pituitary gland in the brain.  A disease that affects the ovaries and hormone production.  Radiation treatment for cancer.  Certain cancer treatments, such as chemotherapy or hormone (anti-estrogen) therapy.  Heavy smoking and excessive alcohol use.  Family history of early menopause. This condition is also more likely to develop earlier in women who are very thin. What are the signs or symptoms? Symptoms of this condition include:  Hot flashes.  Irregular menstrual periods.  Night sweats.  Changes in feelings about sex. This could be a decrease in sex drive or an increased comfort around your sexuality.  Vaginal dryness and thinning of the vaginal walls. This may cause painful intercourse.  Dryness of the skin and development of wrinkles.  Headaches.  Problems sleeping (insomnia).  Mood swings or irritability.  Memory problems.  Weight gain.  Hair growth on the  face and chest.  Bladder infections or problems with urinating. How is this diagnosed? This condition is diagnosed based on your medical history, a physical exam, your age, your menstrual history, and your symptoms. Hormone tests may also be done. How is this treated? In some cases, no treatment is needed. You and your health care provider should make a decision together about whether treatment is necessary. Treatment will be based on your individual condition and preferences. Treatment for this condition focuses on managing symptoms. Treatment may include:  Menopausal hormone therapy (MHT).  Medicines to treat specific symptoms or complications.  Acupuncture.  Vitamin or herbal supplements. Before starting treatment, make sure to let your health care provider know if you have a personal or family history of:  Heart disease.  Breast cancer.  Blood clots.  Diabetes.  Osteoporosis. Follow these instructions at home: Lifestyle  Do not use any products that contain nicotine or tobacco, such as cigarettes and e-cigarettes. If you need help quitting, ask your health care provider.  Get at least 30 minutes of physical activity on 5 or more days each week.  Avoid alcoholic and caffeinated beverages, as well as spicy foods. This may help prevent hot flashes.  Get 7-8 hours of sleep each night.  If you have hot flashes, try: ? Dressing in layers. ? Avoiding things that may trigger hot flashes, such as spicy food, warm places, or stress. ? Taking slow, deep breaths when a hot flash starts. ? Keeping a fan in your home and office.  Find ways to manage stress, such as deep breathing, meditation, or journaling.  Consider going to group therapy with other women who are having menopause symptoms.  Ask your health care provider about recommended group therapy meetings. Eating and drinking  Eat a healthy, balanced diet that contains whole grains, lean protein, low-fat dairy, and plenty  of fruits and vegetables.  Your health care provider may recommend adding more soy to your diet. Foods that contain soy include tofu, tempeh, and soy milk.  Eat plenty of foods that contain calcium and vitamin D for bone health. Items that are rich in calcium include low-fat milk, yogurt, beans, almonds, sardines, broccoli, and kale. Medicines  Take over-the-counter and prescription medicines only as told by your health care provider.  Talk with your health care provider before starting any herbal supplements. If prescribed, take vitamins and supplements as told by your health care provider. These may include: ? Calcium. Women age 98 and older should get 1,200 mg (milligrams) of calcium every day. ? Vitamin D. Women need 600-800 International Units of vitamin D each day. ? Vitamins B12 and B6. Aim for 50 micrograms of B12 and 1.5 mg of B6 each day. General instructions  Keep track of your menstrual periods, including: ? When they occur. ? How heavy they are and how long they last. ? How much time passes between periods.  Keep track of your symptoms, noting when they start, how often you have them, and how long they last.  Use vaginal lubricants or moisturizers to help with vaginal dryness and improve comfort during sex.  Keep all follow-up visits as told by your health care provider. This is important. This includes any group therapy or counseling. Contact a health care provider if:  You are still having menstrual periods after age 37.  You have pain during sex.  You have not had a period for 12 months and you develop vaginal bleeding. Get help right away if:  You have: ? Severe depression. ? Excessive vaginal bleeding. ? Pain when you urinate. ? A fast or irregular heart beat (palpitations). ? Severe headaches. ? Abdomen (abdominal) pain or severe indigestion.  You fell and you think you have a broken bone.  You develop leg or chest pain.  You develop vision  problems.  You feel a lump in your breast. Summary  Menopause is the normal time of life when menstrual periods stop completely. It is usually confirmed by 12 months without a menstrual period.  The transition to menopause (perimenopause) most often happens between the ages of 57 and 6.  Symptoms can be managed through medicines, lifestyle changes, and complementary therapies such as acupuncture.  Eat a balanced diet that is rich in nutrients to promote bone health and heart health and to manage symptoms during menopause. This information is not intended to replace advice given to you by your health care provider. Make sure you discuss any questions you have with your health care provider. Document Released: 11/27/2003 Document Revised: 10/09/2016 Document Reviewed: 10/09/2016 Elsevier Interactive Patient Education  2019 [employer].

## 2018-09-22 NOTE — Assessment & Plan Note (Signed)
D/C Effexor.  Trial Lexapro 5 MG QHS.  Return in 4 weeks to reassess.  At length education provided on menopause and foods to avoid.

## 2018-09-22 NOTE — Progress Notes (Signed)
BP 135/83   Pulse 64   Temp (!) 97.3 F (36.3 C) (Oral)   Ht 5\' 6"  (1.676 m)   Wt 171 lb (77.6 kg)   LMP  (LMP Unknown)   SpO2 99%   BMI 27.60 kg/m    Subjective:    Patient ID: Christina [name], female    DOB: 09/09/1961, 58 y.o.   MRN: [id number]  HPI: Christina [name] is a 58 y.o. female  Chief Complaint  Patient presents with  . Follow-up    Hot flashes; horrible. Worsening  . Thyroid Problem   HYPOTHYROIDISM Thyroid control status:stable Satisfied with current treatment? yes Medication side effects: no Medication compliance: excellent compliance Etiology of hypothyroidism:  Recent dose adjustment:yes, decreased to 50 MCG at last visit Fatigue: no Cold intolerance: no Heat intolerance: no Weight gain: no Weight loss: no Constipation: no Diarrhea/loose stools: no Palpitations: no Lower extremity edema: no Anxiety/depressed mood: no   MENOPAUSAL SYMPTOMS Had tried Celexa initially with minimal success and then switched to Effexor, which she reports was "too strong", so she returned to 1/2 a Celexa pill which is not working.  Reports she is very sensitive to medications. 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 Interim medical history since our last visit reviewed. Allergies and medications reviewed and updated.  Review of Systems  Constitutional: Negative for activity change, appetite change, diaphoresis, fatigue and fever.  Respiratory: Negative for cough, chest tightness and shortness of breath.   Cardiovascular: Negative for chest pain, palpitations and leg swelling.  Gastrointestinal: Negative for abdominal distention, abdominal pain, constipation, diarrhea, nausea and vomiting.  Endocrine: Negative for cold intolerance, heat intolerance, polydipsia, polyphagia and polyuria.  Genitourinary: Negative for dyspareunia, menstrual problem and vaginal discharge.  Neurological: Negative for dizziness, numbness and headaches.  Psychiatric/Behavioral: Negative.     Per HPI unless specifically indicated above     Objective:    BP 135/83   Pulse 64   Temp (!) 97.3 F (36.3 C) (Oral)   Ht 5\' 6"  (1.676 m)   Wt 171 lb (77.6 kg)   LMP  (LMP Unknown)   SpO2 99%   BMI 27.60 kg/m   Wt Readings from Last 3 Encounters:  09/22/18 171 lb (77.6 kg)  06/22/18 165 lb (74.8 kg)  04/25/18 167 lb 8 oz (76 kg)    Physical Exam Vitals signs and nursing note reviewed.  Constitutional:      General: She is awake.     Appearance: She is well-developed.  HENT:     Head: Normocephalic.     Right Ear: Hearing normal.     Left Ear: Hearing normal.     Nose: Nose normal.     Mouth/Throat:     Mouth: Mucous membranes are moist.  Eyes:  General: Lids are normal.        Right eye: No discharge.        Left eye: No discharge.     Conjunctiva/sclera: Conjunctivae normal.     Pupils: Pupils are equal, round, and reactive to light.  Neck:     Musculoskeletal: Normal range of motion and neck supple.     Thyroid: No thyromegaly.     Vascular: No carotid bruit or JVD.  Cardiovascular:     Rate and Rhythm: Normal rate and regular rhythm.     Heart sounds: Normal heart  sounds.  Pulmonary:     Effort: Pulmonary effort is normal.     Breath sounds: Normal breath sounds.  Abdominal:     General: Bowel sounds are normal.     Palpations: Abdomen is soft. There is no hepatomegaly or splenomegaly.  Lymphadenopathy:     Cervical: No cervical adenopathy.  Skin:    General: Skin is warm and dry.  Neurological:     Mental Status: She is alert and oriented to person, place, and time.  Psychiatric:        Attention and Perception: Attention normal.        Mood and Affect: Mood normal.        Behavior: Behavior normal. Behavior is cooperative.        Thought Content: Thought content normal.        Judgment: Judgment normal.     Results for orders placed or performed in visit on 06/22/18  TSH  Result Value Ref Range   TSH 1.750 0.450 - 4.500 uIU/mL      Assessment & Plan:   Problem List Items Addressed This Visit      Endocrine   Hypothyroid    Thyroid panel today.  Adjust dose as needed.      Relevant Orders   Thyroid Panel With TSH     Other   Hot flashes due to menopause    D/C Effexor.  Trial Lexapro 5 MG QHS.  Return in 4 weeks to reassess.  At length education provided on menopause and foods to avoid.       Other Visit Diagnoses    Breast cancer screening    -  Primary   Relevant Orders   MM DIGITAL SCREENING BILATERAL       Follow up plan: Return in about 4 weeks (around 10/20/2018), or Menopause.

## 2018-09-23 LAB — THYROID PANEL WITH TSH
Free Thyroxine Index: 1.6 (ref 1.2–4.9)
T3 UPTAKE RATIO: 23 % — AB (ref 24–39)
T4 TOTAL: 7.1 ug/dL (ref 4.5–12.0)
TSH: 5.09 u[IU]/mL — ABNORMAL HIGH (ref 0.450–4.500)

## 2018-10-26 ENCOUNTER — Inpatient Hospital Stay: Payer: 59 | Admitting: Oncology

## 2018-10-26 ENCOUNTER — Inpatient Hospital Stay: Payer: 59

## 2018-10-26 ENCOUNTER — Inpatient Hospital Stay: Payer: 59 | Attending: Oncology

## 2018-10-30 ENCOUNTER — Other Ambulatory Visit: Payer: Self-pay

## 2018-10-30 ENCOUNTER — Ambulatory Visit: Payer: 59 | Admitting: Nurse Practitioner

## 2018-10-30 ENCOUNTER — Encounter: Payer: Self-pay | Admitting: Nurse Practitioner

## 2018-10-30 VITALS — BP 111/76 | HR 67 | Temp 98.3°F | Ht 66.0 in | Wt 172.0 lb

## 2018-10-30 DIAGNOSIS — E039 Hypothyroidism, unspecified: Secondary | ICD-10-CM

## 2018-10-30 NOTE — Assessment & Plan Note (Signed)
Thyroid panel today.  Adjust dose as needed based on lab results.  Will contact patient via MyChart in morning with results.

## 2018-10-30 NOTE — Progress Notes (Signed)
BP 111/76   Pulse 67   Temp 98.3 F (36.8 C) (Oral)   Ht 5\' 6"  (1.676 m)   Wt 172 lb (78 kg)   LMP  (LMP Unknown)   SpO2 96%   BMI 27.76 kg/m    Subjective:    Patient ID: [name], female    DOB: 04/04/1961, 58 y.o.   MRN: [id number]  HPI: Christina [name] is a 58 y.o. female  Chief Complaint  Patient presents with  . Hypothyroidism    f/u   HYPOTHYROIDISM Last TSH in January 2020 was 5.090 with normal T4 and slightly low T3, maintained dose at 50 MCG daily.  She remains asymptomatic.  Takes her medication first thing in the morning 30 minutes prior to other medications and food. Thyroid control status:controlled Satisfied with current treatment? yes Medication side effects: no Medication compliance: excellent compliance Etiology of hypothyroidism:  Recent dose adjustment:no Fatigue: no Cold intolerance: no Heat intolerance: no Weight gain: no Weight loss: no Constipation: no Diarrhea/loose stools: no Palpitations: no Lower extremity edema: no Anxiety/depressed mood: no  Relevant past medical, surgical, family and social history reviewed and updated as indicated. Interim medical history since our last visit reviewed. Allergies and medications reviewed and updated.  Review of Systems  Constitutional: Negative for activity change, appetite change, diaphoresis, fatigue and fever.  Respiratory: Negative for cough, chest tightness and shortness of breath.   Cardiovascular: Negative for chest pain, palpitations and leg swelling.  Gastrointestinal: Negative for abdominal distention, abdominal pain, constipation, diarrhea, nausea and vomiting.  Endocrine: Negative for cold intolerance, heat intolerance, polydipsia, polyphagia and polyuria.  Neurological: Negative for dizziness, syncope, weakness, light-headedness, numbness and headaches.  Psychiatric/Behavioral: Negative.     Per HPI unless specifically indicated above     Objective:    BP 111/76   Pulse 67    Temp 98.3 F (36.8 C) (Oral)   Ht 5\' 6"  (1.676 m)   Wt 172 lb (78 kg)   LMP  (LMP Unknown)   SpO2 96%   BMI 27.76 kg/m   Wt Readings from Last 3 Encounters:  10/30/18 172 lb (78 kg)  09/22/18 171 lb (77.6 kg)  06/22/18 165 lb (74.8 kg)    Physical Exam Vitals signs and nursing note reviewed.  Constitutional:      General: She is awake.     Appearance: She is well-developed.  HENT:     Head: Normocephalic.     Right Ear: Hearing normal.     Left Ear: Hearing normal.     Nose: Nose normal.     Mouth/Throat:     Mouth: Mucous membranes are moist.  Eyes:     General: Lids are normal.        Right eye: No discharge.        Left eye: No discharge.     Conjunctiva/sclera: Conjunctivae normal.     Pupils: Pupils are equal, round, and reactive to light.  Neck:     Musculoskeletal: Normal range of motion and neck supple.     Thyroid: No thyromegaly.     Vascular: No carotid bruit or JVD.  Cardiovascular:     Rate and Rhythm: Normal rate and regular rhythm.     Heart sounds: Normal heart sounds. No murmur. No gallop.   Pulmonary:     Effort: Pulmonary effort is normal.     Breath sounds: Normal breath sounds.  Abdominal:     General: Bowel sounds are normal.  Palpations: Abdomen is soft. There is no hepatomegaly or splenomegaly.  Musculoskeletal:     Right lower leg: No edema.     Left lower leg: No edema.  Lymphadenopathy:     Cervical: No cervical adenopathy.  Skin:    General: Skin is warm and dry.  Neurological:     Mental Status: She is alert and oriented to person, place, and time.  Psychiatric:        Attention and Perception: Attention normal.        Mood and Affect: Mood normal.        Behavior: Behavior normal. Behavior is cooperative.        Thought Content: Thought content normal.        Judgment: Judgment normal.     Results for orders placed or performed in visit on 09/22/18  Thyroid Panel With TSH  Result Value Ref Range   TSH 5.090 (H) 0.450  - 4.500 uIU/mL   T4, Total 7.1 4.5 - 12.0 ug/dL   T3 Uptake Ratio 23 (L) 24 - 39 %   Free Thyroxine Index 1.6 1.2 - 4.9      Assessment & Plan:   Problem List Items Addressed This Visit      Endocrine   Hypothyroid - Primary    Thyroid panel today.  Adjust dose as needed based on lab results.  Will contact patient via MyChart in morning with results.      Relevant Orders   Thyroid Panel With TSH       Follow up plan: Return in about 6 months (around 04/30/2019) for Hypothyroid and Menopause.

## 2018-10-30 NOTE — Patient Instructions (Signed)
Hypothyroidism  Hypothyroidism is when the thyroid gland does not make enough of certain hormones (it is underactive). The thyroid gland is a small gland located in the lower front part of the neck, just in front of the windpipe (trachea). This gland makes hormones that help control how the body uses food for energy (metabolism) as well as how the heart and brain function. These hormones also play a role in keeping your bones strong. When the thyroid is underactive, it produces too little of the hormones thyroxine (T4) and triiodothyronine (T3). What are the causes? This condition may be caused by:  Hashimoto's disease. This is a disease in which the body's disease-fighting system (immune system) attacks the thyroid gland. This is the most common cause.  Viral infections.  Pregnancy.  Certain medicines.  Birth defects.  Past radiation treatments to the head or neck for cancer.  Past treatment with radioactive iodine.  Past exposure to radiation in the environment.  Past surgical removal of part or all of the thyroid.  Problems with a gland in the center of the brain (pituitary gland).  Lack of enough iodine in the diet. What increases the risk? You are more likely to develop this condition if:  You are female.  You have a family history of thyroid conditions.  You use a medicine called lithium.  You take medicines that affect the immune system (immunosuppressants). What are the signs or symptoms? Symptoms of this condition include:  Feeling as though you have no energy (lethargy).  Not being able to tolerate cold.  Weight gain that is not explained by a change in diet or exercise habits.  Lack of appetite.  Dry skin.  Coarse hair.  Menstrual irregularity.  Slowing of thought processes.  Constipation.  Sadness or depression. How is this diagnosed? This condition may be diagnosed based on:  Your symptoms, your medical history, and a physical exam.  Blood  tests. You may also have imaging tests, such as an ultrasound or MRI. How is this treated? This condition is treated with medicine that replaces the thyroid hormones that your body does not make. After you begin treatment, it may take several weeks for symptoms to go away. Follow these instructions at home:  Take over-the-counter and prescription medicines only as told by your health care provider.  If you start taking any new medicines, tell your health care provider.  Keep all follow-up visits as told by your health care provider. This is important. ? As your condition improves, your dosage of thyroid hormone medicine may change. ? You will need to have blood tests regularly so that your health care provider can monitor your condition. Contact a health care provider if:  Your symptoms do not get better with treatment.  You are taking thyroid replacement medicine and you: ? Sweat a lot. ? Have tremors. ? Feel anxious. ? Lose weight rapidly. ? Cannot tolerate heat. ? Have emotional swings. ? Have diarrhea. ? Feel weak. Get help right away if you have:  Chest pain.  An irregular heartbeat.  A rapid heartbeat.  Difficulty breathing. Summary  Hypothyroidism is when the thyroid gland does not make enough of certain hormones (it is underactive).  When the thyroid is underactive, it produces too little of the hormones thyroxine (T4) and triiodothyronine (T3).  The most common cause is Hashimoto's disease, a disease in which the body's disease-fighting system (immune system) attacks the thyroid gland. The condition can also be caused by viral infections, medicine, pregnancy, or past   radiation treatment to the head or neck.  Symptoms may include weight gain, dry skin, constipation, feeling as though you do not have energy, and not being able to tolerate cold.  This condition is treated with medicine to replace the thyroid hormones that your body does not make. This information  is not intended to replace advice given to you by your health care provider. Make sure you discuss any questions you have with your health care provider. Document Released: 09/06/2005 Document Revised: 08/17/2017 Document Reviewed: 08/17/2017 Elsevier Interactive Patient Education  2019 [employer]  

## 2018-10-31 LAB — THYROID PANEL WITH TSH
Free Thyroxine Index: 1.9 (ref 1.2–4.9)
T3 Uptake Ratio: 23 % — ABNORMAL LOW (ref 24–39)
T4, Total: 8.3 ug/dL (ref 4.5–12.0)
TSH: 3.79 u[IU]/mL (ref 0.450–4.500)

## 2018-11-09 ENCOUNTER — Inpatient Hospital Stay: Payer: 59 | Admitting: Oncology

## 2018-11-09 ENCOUNTER — Inpatient Hospital Stay: Payer: 59

## 2018-11-17 ENCOUNTER — Other Ambulatory Visit: Payer: Self-pay

## 2018-11-17 DIAGNOSIS — C8331 Diffuse large B-cell lymphoma, lymph nodes of head, face, and neck: Secondary | ICD-10-CM

## 2018-11-20 ENCOUNTER — Encounter: Payer: Self-pay | Admitting: Oncology

## 2018-11-20 ENCOUNTER — Other Ambulatory Visit: Payer: Self-pay

## 2018-11-20 ENCOUNTER — Inpatient Hospital Stay: Payer: 59 | Attending: Oncology

## 2018-11-20 ENCOUNTER — Inpatient Hospital Stay (HOSPITAL_BASED_OUTPATIENT_CLINIC_OR_DEPARTMENT_OTHER): Payer: 59 | Admitting: Oncology

## 2018-11-20 ENCOUNTER — Inpatient Hospital Stay: Payer: 59

## 2018-11-20 VITALS — BP 116/69 | HR 55 | Temp 98.7°F | Resp 18 | Wt 172.7 lb

## 2018-11-20 DIAGNOSIS — Z79899 Other long term (current) drug therapy: Secondary | ICD-10-CM | POA: Diagnosis not present

## 2018-11-20 DIAGNOSIS — Z08 Encounter for follow-up examination after completed treatment for malignant neoplasm: Secondary | ICD-10-CM

## 2018-11-20 DIAGNOSIS — Z8249 Family history of ischemic heart disease and other diseases of the circulatory system: Secondary | ICD-10-CM | POA: Insufficient documentation

## 2018-11-20 DIAGNOSIS — Z8579 Personal history of other malignant neoplasms of lymphoid, hematopoietic and related tissues: Principal | ICD-10-CM

## 2018-11-20 DIAGNOSIS — C8331 Diffuse large B-cell lymphoma, lymph nodes of head, face, and neck: Secondary | ICD-10-CM | POA: Diagnosis present

## 2018-11-20 LAB — CBC WITH DIFFERENTIAL/PLATELET
Abs Immature Granulocytes: 0 10*3/uL (ref 0.00–0.07)
Basophils Absolute: 0 10*3/uL (ref 0.0–0.1)
Basophils Relative: 0 %
Eosinophils Absolute: 0 10*3/uL (ref 0.0–0.5)
Eosinophils Relative: 0 %
HCT: 40 % (ref 36.0–46.0)
Hemoglobin: 13.2 g/dL (ref 12.0–15.0)
Immature Granulocytes: 0 %
Lymphocytes Relative: 30 %
Lymphs Abs: 1.7 10*3/uL (ref 0.7–4.0)
MCH: 29.2 pg (ref 26.0–34.0)
MCHC: 33 g/dL (ref 30.0–36.0)
MCV: 88.5 fL (ref 80.0–100.0)
Monocytes Absolute: 0.5 10*3/uL (ref 0.1–1.0)
Monocytes Relative: 9 %
Neutro Abs: 3.4 10*3/uL (ref 1.7–7.7)
Neutrophils Relative %: 61 %
Platelets: 225 10*3/uL (ref 150–400)
RBC: 4.52 MIL/uL (ref 3.87–5.11)
RDW: 12.3 % (ref 11.5–15.5)
WBC: 5.7 10*3/uL (ref 4.0–10.5)
nRBC: 0 % (ref 0.0–0.2)

## 2018-11-20 LAB — COMPREHENSIVE METABOLIC PANEL
ALT: 17 U/L (ref 0–44)
AST: 17 U/L (ref 15–41)
Albumin: 4 g/dL (ref 3.5–5.0)
Alkaline Phosphatase: 100 U/L (ref 38–126)
Anion gap: 6 (ref 5–15)
BUN: 15 mg/dL (ref 6–20)
CO2: 26 mmol/L (ref 22–32)
Calcium: 9.2 mg/dL (ref 8.9–10.3)
Chloride: 107 mmol/L (ref 98–111)
Creatinine, Ser: 0.96 mg/dL (ref 0.44–1.00)
GFR calc Af Amer: >60 mL/min (ref >60)
GFR calc non Af Amer: >60 mL/min (ref >60)
Glucose, Bld: 100 mg/dL — ABNORMAL HIGH (ref 70–99)
POTASSIUM: 4.1 mmol/L (ref 3.5–5.1)
Sodium: 139 mmol/L (ref 135–145)
Total Bilirubin: 0.5 mg/dL (ref 0.3–1.2)
Total Protein: 7.6 g/dL (ref 6.5–8.1)

## 2018-11-20 LAB — LACTATE DEHYDROGENASE: LDH: 133 U/L (ref 98–192)

## 2018-11-20 NOTE — Progress Notes (Signed)
Here for follow up. Overall stated " I feel marvelous "  About to go on trip to [place] .

## 2018-11-20 NOTE — Progress Notes (Signed)
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 s/p 3 cycles of RCHOPand IFRT   Chief complaint/ Reason for visit-routine follow-up of DLBCL  Heme/Onc history: 1. Patient is a 58 year old female who presented to her primary care doctor with symptoms of right neck swelling in January 2018. Her symptoms of an ongoing since December 2017. Patient works at a [employer] (medial in the swelling but the swelling did not go away. She was then referred to ENT.   2. CT of the soft tissue neck with contrast showed malignant right leg lymphadenopathy with heterogeneous enhancement and extracapsular extension occupying both the right level II and level III nodal stations. Individually B abnormal lymph nodes measure up to 4.2 cm in largest dimension and the conglomerulationof abnormal lymph nodes is 5-5.5 cm across. There is a small but asymmetric and conspicuity 7 mm lymph node along the lower right 3-Bstation. Mass effect on the right carotid space from the abnormal nodes but the major vascular structures in the neck at the skull base including the right IJ remained patent.  3. 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 Cells were CD20 positive and BCL 6 and partially dim BCL-2 positive. B cells were negative for C myc (10%). Mom one was positive in 50% of the cells. Ki-67 was 50%.Bone marrow biopsy was negative for lymphoma involvement  4. PET/CT showed IMPRESSION: 1. FDG avid malignancy is identified in the level 2 and 3  nodal stations of the right cervical lymph node chain, consistent with the patient's known lymphoma. 2. Focal uptake in the gastric cardia is suspicious for malignancy as well. Recommend direct visualization and biopsy as clinically Warranted.  5. EGD showed diffuse moderately erythematous mucosa without bleeding initial nonbleeding gastric ulcer 6 mm which was biopsied. Findings were consistent with H. pylori gastritis and patient was started on antibiotictherapy for the same. Repeat H pylori testing negative  6. EUS also showed H pylori. There were few B cells noted in lamina propria and clonality studies have been ordered. Initial testing for B cell clonality showed no monoclonal B-cell population. Confirmation testing also did not reveal clonal B cells  7. PET/CT scan after cycles of RCHOP showed: IMPRESSION: 1. Complete metabolic response. No metabolically active lymphoma. 2. Uniform low-level marrow hypermetabolism throughout the axial skeleton, compatible with reactive marrow state due to chemotherapy. 3. No residual gastric hypermetabolism  8. She completed 3 cycles of RCHOP between feb 2018-aoril 2018 followed by IFRTto her neck in June 2018   Interval history-she feels well.  Her appetite is good and she denies any unintentional weight loss.  Denies any lumps or bumps anywhere, fatigue or drenching night sweats  ECOG PS- 0 Pain scale- 0   Review of systems- Review of Systems  Constitutional: Negative for chills, fever, malaise/fatigue and weight loss.  HENT: Negative for congestion, ear discharge and nosebleeds.   Eyes: Negative for blurred vision.  Respiratory: Negative for cough, hemoptysis, sputum production, shortness of breath and wheezing.   Cardiovascular: Negative for chest pain, palpitations, orthopnea and claudication.  Gastrointestinal: Negative for abdominal  pain, blood in stool, constipation, diarrhea, heartburn, melena, nausea and vomiting.    Genitourinary: Negative for dysuria, flank pain, frequency, hematuria and urgency.  Musculoskeletal: Negative for back pain, joint pain and myalgias.  Skin: Negative for rash.  Neurological: Negative for dizziness, tingling, focal weakness, seizures, weakness and headaches.  Endo/Heme/Allergies: Does not bruise/bleed easily.  Psychiatric/Behavioral: Negative for depression and suicidal ideas. The patient does not have insomnia.       No Known Allergies   Past Medical History:  Diagnosis Date  . B-cell lymphoma ([place])   . Cancer (HCC)    lymphoma, non hodgekins B cell  . Chest pain 12/08/2016  . GERD (gastroesophageal reflux disease)      Past Surgical History:  Procedure Laterality Date  . ABDOMINAL HYSTERECTOMY    . CESAREAN SECTION     x 2   . COLONOSCOPY WITH PROPOFOL N/A 09/28/2017   Procedure: COLONOSCOPY WITH PROPOFOL;  Surgeon: Jonathon Bellows, MD;  Location: [facility] ENDOSCOPY;  Service: Gastroenterology;  Laterality: N/A;  . ESOPHAGOGASTRODUODENOSCOPY (EGD) WITH PROPOFOL N/A 11/11/2016   Procedure: ESOPHAGOGASTRODUODENOSCOPY (EGD) WITH PROPOFOL;  Surgeon: Jonathon Bellows, MD;  Location: [facility] ENDOSCOPY;  Service: Endoscopy;  Laterality: N/A;  . IR GENERIC HISTORICAL  11/03/2016   IR FLUORO GUIDE PORT INSERTION RIGHT 11/03/2016 Aletta Edouard, MD [facility]-INTERV RAD  . IR REMOVAL TUN ACCESS W/ PORT W/O FL MOD SED  02/15/2017  . REFRACTIVE SURGERY  09/2015    Social History   Socioeconomic History  . Marital status: Single    Spouse name: Not on file  . Number of children: Not on file  . Years of education: Not on file  . Highest education level: Not on file  Occupational History  . Not on file  Social Needs  . Financial resource strain: Not on file  . Food insecurity:    Worry: Not on file    Inability: Not on file  . Transportation needs:    Medical: Not on file    Non-medical: Not on file  Tobacco Use  . Smoking status: Never Smoker  . Smokeless tobacco: Never Used   Substance and Sexual Activity  . Alcohol use: No  . Drug use: No  . Sexual activity: Yes  Lifestyle  . Physical activity:    Days per week: Not on file    Minutes per session: Not on file  . Stress: Not on file  Relationships  . Social connections:    Talks on phone: Not on file    Gets together: Not on file    Attends religious service: Not on file    Active member of club or organization: Not on file    Attends meetings of clubs or organizations: Not on file    Relationship status: Not on file  . Intimate partner violence:    Fear of current or ex partner: Not on file    Emotionally abused: Not on file    Physically abused: Not on file    Forced sexual activity: Not on file  Other Topics Concern  . Not on file  Social History Narrative  . Not on file    Family History  Problem Relation Age of Onset  . Cancer Mother        breast  . Cancer Father        lung  . Heart disease Brother 49       MI  . Heart disease Paternal Grandfather        MI  Current Outpatient Medications:  .  escitalopram (LEXAPRO) 5 MG tablet, Take 1 tablet (5 mg total) by mouth daily., Disp: 90 tablet, Rfl: 3 .  levothyroxine (SYNTHROID, LEVOTHROID) 50 MCG tablet, Take 1 tablet (50 mcg total) by mouth daily., Disp: 90 tablet, Rfl: 0 .  lidocaine-prilocaine (EMLA) cream, as needed., Disp: , Rfl:   Physical exam:  Vitals:   11/20/18 1031  BP: 116/69  Pulse: (!) 55  Resp: 18  Temp: 98.7 F (37.1 C)  TempSrc: Tympanic  Weight: 172 lb 11.2 oz (78.3 kg)   Physical Exam HENT:     Head: Normocephalic and atraumatic.  Eyes:     Pupils: Pupils are equal, round, and reactive to light.  Neck:     Musculoskeletal: Normal range of motion.  Cardiovascular:     Rate and Rhythm: Normal rate and regular rhythm.     Heart sounds: Normal heart sounds.  Pulmonary:     Effort: Pulmonary effort is normal.     Breath sounds: Normal breath sounds.  Abdominal:     General: Bowel sounds are  normal.     Palpations: Abdomen is soft.  Lymphadenopathy:     Comments: No palpable cervical, supraclavicular, axillary or inguinal adenopathy   Skin:    General: Skin is warm and dry.  Neurological:     Mental Status: She is alert and oriented to person, place, and time.      CMP Latest Ref Rng & Units 11/20/2018  Glucose 70 - 99 mg/dL 100(H)  BUN 6 - 20 mg/dL 15  Creatinine 0.44 - 1.00 mg/dL 0.96  Sodium 135 - 145 mmol/L 139  Potassium 3.5 - 5.1 mmol/L 4.1  Chloride 98 - 111 mmol/L 107  CO2 22 - 32 mmol/L 26  Calcium 8.9 - 10.3 mg/dL 9.2  Total Protein 6.5 - 8.1 g/dL 7.6  Total Bilirubin 0.3 - 1.2 mg/dL 0.5  Alkaline Phos 38 - 126 U/L 100  AST 15 - 41 U/L 17  ALT 0 - 44 U/L 17   CBC Latest Ref Rng & Units 11/20/2018  WBC 4.0 - 10.5 K/uL 5.7  Hemoglobin 12.0 - 15.0 g/dL 13.2  Hematocrit 36.0 - 46.0 % 40.0  Platelets 150 - 400 K/uL 225     Assessment and plan- Patient is a 58 y.o. female h/o StageI DLBCLdouble expressor s/p 3 cycles of RCHOPand IFRTcurrently in remission.  She is here for routine surveillance visit for her DLBCL  Clinically she is doing well and there is no evidence of recurrence on today's exam.  Her CBC and CMP are normal.  I will see her back in 6 months time with CBC CMP and LDH   Visit Diagnosis 1. Encounter for follow-up surveillance of diffuse large B-cell lymphoma      Dr. Randa Evens, MD, MPH [facility] [phone] 11/20/2018 10:35 AM

## 2018-11-21 NOTE — Progress Notes (Signed)
Survivorship Care Plan visit completed.  Treatment summary reviewed and given to patient.  ASCO answers booklet reviewed and given to patient.  CARE program and Cancer Transitions discussed with patient along with other resources cancer center offers to patients and caregivers.  Patient verbalized understanding.    

## 2018-12-27 ENCOUNTER — Telehealth: Payer: Self-pay | Admitting: Nurse Practitioner

## 2018-12-27 ENCOUNTER — Other Ambulatory Visit: Payer: Self-pay | Admitting: Nurse Practitioner

## 2018-12-27 MED ORDER — LEVOTHYROXINE SODIUM 50 MCG PO TABS: 50.0000 ug | ORAL_TABLET | Freq: Every day | ORAL | 2 refills | Status: DC

## 2018-12-27 NOTE — Telephone Encounter (Signed)
Copied from [place] [id number]. Topic: Quick Communication - Rx Refill/Question >> Dec 27, 2018  2:26 PM Keene Breath wrote: Medication: levothyroxine (SYNTHROID, LEVOTHROID) 50 MCG tablet  Patient called to request a refill for the above medication.  Patient stated that she is completely out of medication  Preferred Pharmacy (with phone number or street name): [id number] [address], [place] [phone] (Phone) [phone] (Fax)

## 2018-12-27 NOTE — Progress Notes (Signed)
Levothyroxine refill sent 

## 2018-12-27 NOTE — Telephone Encounter (Signed)
Advised pt that refill has been sent

## 2018-12-27 NOTE — Telephone Encounter (Signed)
REfill sent. 

## 2019-03-04 ENCOUNTER — Other Ambulatory Visit: Payer: Self-pay | Admitting: Unknown Physician Specialty

## 2019-03-30 ENCOUNTER — Other Ambulatory Visit: Payer: Self-pay | Admitting: Nurse Practitioner

## 2019-03-30 ENCOUNTER — Telehealth: Payer: Self-pay | Admitting: Nurse Practitioner

## 2019-03-30 MED ORDER — ESCITALOPRAM OXALATE 5 MG PO TABS: 5.0000 mg | ORAL_TABLET | Freq: Every day | ORAL | 3 refills | Status: DC

## 2019-03-30 NOTE — Telephone Encounter (Signed)
Medication Refill - Medication: escitalopram (LEXAPRO) 5 MG tablet   Has the patient contacted their pharmacy? Yes.   (Agent: If no, request that the patient contact the pharmacy for the refill.) (Agent: If yes, when and what did the pharmacy advise?)  Preferred Pharmacy (with phone number or street name): [id number] - [place], [place]: Please be advised that RX refills may take up to 3 business days. We ask that you follow-up with your pharmacy.

## 2019-03-30 NOTE — Telephone Encounter (Signed)
completed

## 2019-04-12 ENCOUNTER — Telehealth: Payer: Self-pay | Admitting: *Deleted

## 2019-04-12 NOTE — Telephone Encounter (Signed)
Pt called to see if she could be sent to neurology.  States that she has muscle aches starting at knee caps to her feet. It hurts more when walking and she can only do short distances before she has to sit and rest. She wanted to see what Janese Banks had to say. I explained it to Dr. Janese Banks and she says that are different reasons but she does not think it is related to her cancer her side effects since treatment a long time ago and she should be evaluated by PCP- sometimes low mag and low potassium can cause muscle cramps and let them eval and if needed they can send her to neurology. She is fine with that and will contact PCP

## 2019-04-18 ENCOUNTER — Encounter: Payer: Self-pay | Admitting: Nurse Practitioner

## 2019-04-18 ENCOUNTER — Other Ambulatory Visit: Payer: Self-pay

## 2019-04-18 ENCOUNTER — Ambulatory Visit: Payer: 59 | Admitting: Nurse Practitioner

## 2019-04-18 VITALS — BP 106/72 | HR 67 | Temp 98.6°F

## 2019-04-18 DIAGNOSIS — E039 Hypothyroidism, unspecified: Secondary | ICD-10-CM | POA: Diagnosis not present

## 2019-04-18 DIAGNOSIS — G629 Polyneuropathy, unspecified: Secondary | ICD-10-CM

## 2019-04-18 MED ORDER — GABAPENTIN 100 MG PO CAPS: 100.0000 mg | ORAL_CAPSULE | Freq: Every day | ORAL | 3 refills | Status: DC

## 2019-04-18 NOTE — Assessment & Plan Note (Signed)
Ongoing to bilateral legs, more notable lower legs.  Obtain labs today Mag, CMP, TSH, A1C, CBC, anemia panel.  Will trial Gabapentin 100 MG at night.  Recommend Tylenol as needed at home for discomfort and frequent stretching + increased hydration.  Adjust plan of care as needed. Return in 4 weeks for follow-up or sooner for worsening discomfort.

## 2019-04-18 NOTE — Progress Notes (Signed)
BP 106/72   Pulse 67   Temp 98.6 F (37 C) (Oral)   LMP  (LMP Unknown)   SpO2 97%    Subjective:    Patient ID: Christina [name], female    DOB: 15-Jun-1961, 58 y.o.   MRN: [id number]  HPI: Christina [name] is a 58 y.o. female  Chief Complaint  Patient presents with  . Leg Pain    bilateral, states she had chemo in the past and wonders if it is neuropathy, burning sensations around shins and ankles.    LEG CRAMPS History of chemo, last 2 years ago.  She spoke to oncology provider and they stated they felt it was "slightly to none" that this was related.  Has been present for "quite awhile", at least a "good 8 months".  It feels like "that second or third day after having a good work out, very sore with muscle cramps on and off".  Mostly around ankles up to top of legs.  Reports it feels like they are going numb on and off.  States her mother has neuropathy, which also started years after he chemotherapy.  No history of tick bites.  Has taken Gabapentin in past, had to take 100 MG only at night as this was less sedating. Duration: months Pain: yes Severity: 5/10  Quality:  aching, burning and cramping Location:  from ankles to upper legs, posterior and anterior Bilateral:  yes Onset: gradual Frequency: constant Time of  day:   at random, better at night time and worse during daytime Sudden unintentional leg jerking:   yes Paresthesias:   yes Decreased sensation:  no Weakness: some weakness if on feet for long period Insomnia:   no Fatigue:   no Alleviating factors: nothing Aggravating factors: unknown, can not think of anything Status: fluctuating Treatments attempted: Tylenol  HYPOTHYROIDISM Last level 3.790 and T4 8.3.  Currently taking Levothyroxine 50 MCG.   Thyroid control status:stable Satisfied with current treatment? yes Medication side effects: no Medication compliance: good compliance Etiology of hypothyroidism:  Recent dose adjustment:no Fatigue: yes Cold  intolerance: no Heat intolerance: yes Weight gain: no Weight loss: no Constipation: yes Diarrhea/loose stools: no Palpitations: no Lower extremity edema: no Anxiety/depressed mood: no  Relevant past medical, surgical, family and social history reviewed and updated as indicated. Interim medical history since our last visit reviewed. Allergies and medications reviewed and updated.  Review of Systems  Constitutional: Negative for activity change, appetite change, diaphoresis, fatigue and fever.  Respiratory: Negative for cough, chest tightness and shortness of breath.   Cardiovascular: Negative for chest pain, palpitations and leg swelling.  Gastrointestinal: Negative for abdominal distention, abdominal pain, constipation, diarrhea, nausea and vomiting.  Endocrine: Negative for cold intolerance, heat intolerance, polydipsia, polyphagia and polyuria.  Musculoskeletal: Negative for gait problem.  Neurological: Negative for dizziness, syncope, weakness, light-headedness, numbness and headaches.  Psychiatric/Behavioral: Negative.     Per HPI unless specifically indicated above     Objective:    BP 106/72   Pulse 67   Temp 98.6 F (37 C) (Oral)   LMP  (LMP Unknown)   SpO2 97%   Wt Readings from Last 3 Encounters:  11/20/18 172 lb 11.2 oz (78.3 kg)  10/30/18 172 lb (78 kg)  09/22/18 171 lb (77.6 kg)    Physical Exam Vitals signs and nursing note reviewed.  Constitutional:      General: She is awake. She is not in acute distress.    Appearance: She is well-developed and overweight.  She is not ill-appearing.  HENT:     Head: Normocephalic.     Right Ear: Hearing normal.     Left Ear: Hearing normal.     Nose: Nose normal.     Mouth/Throat:     Mouth: Mucous membranes are moist.  Eyes:     General: Lids are normal.        Right eye: No discharge.        Left eye: No discharge.     Conjunctiva/sclera: Conjunctivae normal.     Pupils: Pupils are equal, round, and reactive  to light.  Neck:     Musculoskeletal: Normal range of motion and neck supple.     Thyroid: No thyromegaly.     Vascular: No carotid bruit.  Cardiovascular:     Rate and Rhythm: Normal rate and regular rhythm.     Pulses:          Dorsalis pedis pulses are 2+ on the right side and 2+ on the left side.       Posterior tibial pulses are 2+ on the right side and 2+ on the left side.     Heart sounds: Normal heart sounds. No murmur. No gallop.   Pulmonary:     Effort: Pulmonary effort is normal. No accessory muscle usage or respiratory distress.     Breath sounds: Normal breath sounds.  Abdominal:     General: Bowel sounds are normal.     Palpations: Abdomen is soft.  Musculoskeletal:     Right lower leg: No edema.     Left lower leg: No edema.     Right foot: Normal range of motion. No deformity.     Left foot: Normal range of motion. No deformity.  Feet:     Right foot:     Protective Sensation: 6 sites tested. 10 sites sensed.     Skin integrity: Skin integrity normal.     Toenail Condition: Right toenails are normal.     Left foot:     Protective Sensation: 7 sites tested. 10 sites sensed.     Skin integrity: Skin integrity normal.     Toenail Condition: Left toenails are normal.  Lymphadenopathy:     Cervical: No cervical adenopathy.  Skin:    General: Skin is warm and dry.  Neurological:     Mental Status: She is alert and oriented to person, place, and time.     Gait: Gait is intact.     Deep Tendon Reflexes:     Reflex Scores:      Brachioradialis reflexes are 2+ on the right side and 2+ on the left side.      Patellar reflexes are 2+ on the right side and 2+ on the left side.      Achilles reflexes are 1+ on the right side and 1+ on the left side. Psychiatric:        Attention and Perception: Attention normal.        Mood and Affect: Mood normal.        Behavior: Behavior normal. Behavior is cooperative.        Thought Content: Thought content normal.         Judgment: Judgment normal.     Results for orders placed or performed in visit on 11/20/18  Lactate dehydrogenase  Result Value Ref Range   LDH 133 98 - 192 U/L  Comprehensive metabolic panel  Result Value Ref Range   Sodium 139 135 - 145 mmol/L  Potassium 4.1 3.5 - 5.1 mmol/L   Chloride 107 98 - 111 mmol/L   CO2 26 22 - 32 mmol/L   Glucose, Bld 100 (H) 70 - 99 mg/dL   BUN 15 6 - 20 mg/dL   Creatinine, Ser 0.96 0.44 - 1.00 mg/dL   Calcium 9.2 8.9 - 10.3 mg/dL   Total Protein 7.6 6.5 - 8.1 g/dL   Albumin 4.0 3.5 - 5.0 g/dL   AST 17 15 - 41 U/L   ALT 17 0 - 44 U/L   Alkaline Phosphatase 100 38 - 126 U/L   Total Bilirubin 0.5 0.3 - 1.2 mg/dL   GFR calc non Af Amer >60 >60 mL/min   GFR calc Af Amer >60 >60 mL/min   Anion gap 6 5 - 15  CBC with Differential  Result Value Ref Range   WBC 5.7 4.0 - 10.5 K/uL   RBC 4.52 3.87 - 5.11 MIL/uL   Hemoglobin 13.2 12.0 - 15.0 g/dL   HCT 40.0 36.0 - 46.0 %   MCV 88.5 80.0 - 100.0 fL   MCH 29.2 26.0 - 34.0 pg   MCHC 33.0 30.0 - 36.0 g/dL   RDW 12.3 11.5 - 15.5 %   Platelets 225 150 - 400 K/uL   nRBC 0.0 0.0 - 0.2 %   Neutrophils Relative % 61 %   Neutro Abs 3.4 1.7 - 7.7 K/uL   Lymphocytes Relative 30 %   Lymphs Abs 1.7 0.7 - 4.0 K/uL   Monocytes Relative 9 %   Monocytes Absolute 0.5 0.1 - 1.0 K/uL   Eosinophils Relative 0 %   Eosinophils Absolute 0.0 0.0 - 0.5 K/uL   Basophils Relative 0 %   Basophils Absolute 0.0 0.0 - 0.1 K/uL   Immature Granulocytes 0 %   Abs Immature Granulocytes 0.00 0.00 - 0.07 K/uL      Assessment & Plan:   Problem List Items Addressed This Visit      Endocrine   Hypothyroid    Chronic, ongoing.  Continue current medication regimen and adjust as needed based on labs.  Thyroid panel today.          Nervous and Auditory   Neuropathy - Primary    Ongoing to bilateral legs, more notable lower legs.  Obtain labs today Mag, CMP, TSH, A1C, CBC, anemia panel.  Will trial Gabapentin 100 MG at night.   Recommend Tylenol as needed at home for discomfort and frequent stretching + increased hydration.  Adjust plan of care as needed. Return in 4 weeks for follow-up or sooner for worsening discomfort.        Relevant Orders   Magnesium   CBC with Differential/Platelet   Comprehensive metabolic panel   Anemia panel   TSH   HgB A1c       Follow up plan: Return in about 4 weeks (around 05/16/2019) for Follow-up leg cramps.

## 2019-04-18 NOTE — Patient Instructions (Signed)
Leg Cramps Leg cramps occur when one or more muscles tighten and you have no control over this tightening (involuntary muscle contraction). Muscle cramps can develop in any muscle, but the most common place is in the calf muscles of the leg. Those cramps can occur during exercise or when you are at rest. Leg cramps are painful, and they may last for a few seconds to a few minutes. Cramps may return several times before they finally stop. Usually, leg cramps are not caused by a serious medical problem. In many cases, the cause is not known. Some common causes include:  Excessive physical effort (overexertion), such as during intense exercise.  Overuse from repetitive motions, or doing the same thing over and over.  Staying in a certain position for a long period of time.  Improper preparation, form, or technique while performing a sport or an activity.  Dehydration.  Injury.  Side effects of certain medicines.  Abnormally low levels of minerals in your blood (electrolytes), especially potassium and calcium. This could result from: ? Pregnancy. ? Taking diuretic medicines. Follow these instructions at home: Eating and drinking  Drink enough fluid to keep your urine pale yellow. Staying hydrated may help prevent cramps.  Eat a healthy diet that includes plenty of nutrients to help your muscles function. A healthy diet includes fruits and vegetables, lean protein, whole grains, and low-fat or nonfat dairy products. Managing pain, stiffness, and swelling      Try massaging, stretching, and relaxing the affected muscle. Do this for several minutes at a time.  If directed, put ice on areas that are sore or painful after a cramp: ? Put ice in a plastic bag. ? Place a towel between your skin and the bag. ? Leave the ice on for 20 minutes, 2-3 times a day.  If directed, apply heat to muscles that are tense or tight. Do this before you exercise, or as often as told by your health care  provider. Use the heat source that your health care provider recommends, such as a moist heat pack or a heating pad. ? Place a towel between your skin and the heat source. ? Leave the heat on for 20-30 minutes. ? Remove the heat if your skin turns bright red. This is especially important if you are unable to feel pain, heat, or cold. You may have a greater risk of getting burned.  Try taking hot showers or baths to help relax tight muscles. General instructions  If you are having frequent leg cramps, avoid intense exercise for several days.  Take over-the-counter and prescription medicines only as told by your health care provider.  Keep all follow-up visits as told by your health care provider. This is important. Contact a health care provider if:  Your leg cramps get more severe or more frequent, or they do not improve over time.  Your foot becomes cold, numb, or blue. Summary  Muscle cramps can develop in any muscle, but the most common place is in the calf muscles of the leg.  Leg cramps are painful, and they may last for a few seconds to a few minutes.  Usually, leg cramps are not caused by a serious medical problem. Often, the cause is not known.  Stay hydrated and take over-the-counter and prescription medicines only as told by your health care provider. This information is not intended to replace advice given to you by your health care provider. Make sure you discuss any questions you have with your health care  provider. Document Released: 10/14/2004 Document Revised: 08/19/2017 Document Reviewed: 06/16/2017 Elsevier Patient Education  2020 [employer].

## 2019-04-18 NOTE — Assessment & Plan Note (Signed)
Chronic, ongoing.  Continue current medication regimen and adjust as needed based on labs.  Thyroid panel today.

## 2019-04-20 LAB — CBC WITH DIFFERENTIAL/PLATELET
Basophils Absolute: 0 10*3/uL (ref 0.0–0.2)
Basos: 1 %
EOS (ABSOLUTE): 0 10*3/uL (ref 0.0–0.4)
Eos: 1 %
Hemoglobin: 13.1 g/dL (ref 11.1–15.9)
Immature Grans (Abs): 0 10*3/uL (ref 0.0–0.1)
Immature Granulocytes: 0 %
Lymphocytes Absolute: 1.5 10*3/uL (ref 0.7–3.1)
Lymphs: 30 %
MCH: 29.4 pg (ref 26.6–33.0)
MCHC: 33.1 g/dL (ref 31.5–35.7)
MCV: 89 fL (ref 79–97)
Monocytes Absolute: 0.4 10*3/uL (ref 0.1–0.9)
Monocytes: 9 %
Neutrophils Absolute: 3 10*3/uL (ref 1.4–7.0)
Neutrophils: 59 %
Platelets: 246 10*3/uL (ref 150–450)
RBC: 4.45 x10E6/uL (ref 3.77–5.28)
RDW: 13 % (ref 11.7–15.4)
WBC: 5.1 10*3/uL (ref 3.4–10.8)

## 2019-04-20 LAB — COMPREHENSIVE METABOLIC PANEL
ALT: 12 IU/L (ref 0–32)
AST: 14 IU/L (ref 0–40)
Albumin/Globulin Ratio: 1.9 (ref 1.2–2.2)
Albumin: 4.2 g/dL (ref 3.8–4.9)
Alkaline Phosphatase: 110 IU/L (ref 39–117)
BUN/Creatinine Ratio: 14 (ref 9–23)
BUN: 13 mg/dL (ref 6–24)
Bilirubin Total: 0.2 mg/dL (ref 0.0–1.2)
CO2: 22 mmol/L (ref 20–29)
Calcium: 9.2 mg/dL (ref 8.7–10.2)
Chloride: 105 mmol/L (ref 96–106)
Creatinine, Ser: 0.92 mg/dL (ref 0.57–1.00)
GFR calc Af Amer: 80 mL/min/{1.73_m2} (ref >59)
GFR calc non Af Amer: 70 mL/min/{1.73_m2} (ref >59)
Globulin, Total: 2.2 g/dL (ref 1.5–4.5)
Glucose: 88 mg/dL (ref 65–99)
Potassium: 4.4 mmol/L (ref 3.5–5.2)
Sodium: 140 mmol/L (ref 134–144)
Total Protein: 6.4 g/dL (ref 6.0–8.5)

## 2019-04-20 LAB — ANEMIA PANEL
Ferritin: 39 ng/mL (ref 15–150)
Folate, Hemolysate: 339 ng/mL
Folate, RBC: 856 ng/mL (ref >498)
Hematocrit: 39.6 % (ref 34.0–46.6)
Iron Saturation: 26 % (ref 15–55)
Iron: 76 ug/dL (ref 27–159)
Retic Ct Pct: 1.6 % (ref 0.6–2.6)
Total Iron Binding Capacity: 294 ug/dL (ref 250–450)
UIBC: 218 ug/dL (ref 131–425)
Vitamin B-12: 366 pg/mL (ref 232–1245)

## 2019-04-20 LAB — HEMOGLOBIN A1C
Est. average glucose Bld gHb Est-mCnc: 108 mg/dL
Hgb A1c MFr Bld: 5.4 % (ref 4.8–5.6)

## 2019-04-20 LAB — TSH: TSH: 5.01 u[IU]/mL — ABNORMAL HIGH (ref 0.450–4.500)

## 2019-04-20 LAB — MAGNESIUM: Magnesium: 1.9 mg/dL (ref 1.6–2.3)

## 2019-04-30 ENCOUNTER — Ambulatory Visit: Payer: 59 | Admitting: Nurse Practitioner

## 2019-05-16 ENCOUNTER — Ambulatory Visit: Payer: 59 | Admitting: Nurse Practitioner

## 2019-05-23 ENCOUNTER — Encounter: Payer: Self-pay | Admitting: Nurse Practitioner

## 2019-05-23 ENCOUNTER — Ambulatory Visit (INDEPENDENT_AMBULATORY_CARE_PROVIDER_SITE_OTHER): Payer: 59 | Admitting: Nurse Practitioner

## 2019-05-23 ENCOUNTER — Other Ambulatory Visit: Payer: Self-pay

## 2019-05-23 DIAGNOSIS — E039 Hypothyroidism, unspecified: Secondary | ICD-10-CM | POA: Diagnosis not present

## 2019-05-23 DIAGNOSIS — G629 Polyneuropathy, unspecified: Secondary | ICD-10-CM

## 2019-05-23 NOTE — Progress Notes (Signed)
LMP  (LMP Unknown)    Subjective:    Patient ID: Christina [name], female    DOB: 1961-05-11, 58 y.o.   MRN: [id number]  HPI: [name] is a 58 y.o. female  Chief Complaint  Patient presents with  . Hypothyroidism  . Leg Cramps    . This visit was completed via D[employer] due to the restrictions of the COVID-19 pandemic. All issues as above were discussed and addressed. Physical exam was done as above through visual confirmation on Doximity. If it was felt that the patient should be evaluated in the office, they were directed there. The patient verbally consented to this visit. . Location of the patient: home . Location of the provider: home . Those involved with this call:  . Provider: Marnee Guarneri, DNP . CMA: Yvonna Alanis, CMA . Front Desk/Registration: Jill Side  . Time spent on call: 15 minutes with patient face to face via video conference. More than 50% of this time was spent in counseling and coordination of care. 10 minutes total spent in review of patient's record and preparation of their chart.  . I verified patient identity using two factors (patient name and date of birth). Patient consents verbally to being seen via telemedicine visit today.    HYPOTHYROIDISM Last level 5.010.  Currently taking Levothyroxine 50 MCG.   Thyroid control status:stable Satisfied with current treatment? 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  Was started on Gabapentin at recent visit in July. States her mother has neuropathy, which also started years after her chemotherapy. No history of tick bites.  Has taken  Gabapentin in past, had to take 100 MG only at night as this was less sedating.  Does not wish to try going up on dose at this time due to trying increased doses in past and feeling very tired.  States her hands when she wakes up are stiff and "crampy" now for about 30 minutes, as well.  Denies any pain during daytime hours in hands, just in morning.  During exam last visit sensation right foot 6/10 and left 7/10. Duration: months Pain: yes Severity: 5/10  Quality:  aching, burning and cramping Location:  from ankles to upper legs, posterior and anterior Bilateral:  yes Onset: gradual Frequency: constant Time of  day:   at random, better at night time and worse during daytime Sudden unintentional leg jerking:   yes Paresthesias:   yes Decreased sensation:  no Weakness: some weakness if on feet for long period Insomnia:   no Fatigue:   no Alleviating factors: nothing Aggravating factors: unknown, can not think of anything Status: fluctuating Treatments attempted: Tylenol  Relevant past medical, surgical, family and social history reviewed and updated as indicated. Interim medical history since our last visit reviewed. Allergies and medications reviewed and updated.  Review of Systems  Constitutional: Negative for activity change, appetite change, diaphoresis, fatigue and fever.  Respiratory: Negative for cough, chest tightness and shortness of breath.   Cardiovascular: Negative for chest pain, palpitations and leg swelling.  Gastrointestinal: Negative for abdominal distention, abdominal pain, constipation, diarrhea, nausea and vomiting.  Endocrine: Negative for cold intolerance and heat intolerance.  Musculoskeletal: Positive for arthralgias. Negative for gait  problem.  Neurological: Positive for numbness. Negative for dizziness, syncope, weakness, light-headedness and headaches.  Psychiatric/Behavioral: Negative.     Per HPI unless specifically indicated above     Objective:     LMP  (LMP Unknown)   Wt Readings from Last 3 Encounters:  11/20/18 172 lb 11.2 oz (78.3 kg)  10/30/18 172 lb (78 kg)  09/22/18 171 lb (77.6 kg)    Physical Exam Vitals signs and nursing note reviewed.  Constitutional:      General: She is awake. She is not in acute distress.    Appearance: She is well-developed. She is not ill-appearing.  HENT:     Head: Normocephalic.     Right Ear: Hearing normal.     Left Ear: Hearing normal.  Eyes:     General: Lids are normal.        Right eye: No discharge.        Left eye: No discharge.     Conjunctiva/sclera: Conjunctivae normal.  Neck:     Musculoskeletal: Normal range of motion.  Pulmonary:     Effort: Pulmonary effort is normal. No accessory muscle usage or respiratory distress.     Comments: Unable to auscultate due to virtual exam only  Neurological:     Mental Status: She is alert and oriented to person, place, and time.  Psychiatric:        Attention and Perception: Attention normal.        Mood and Affect: Mood normal.        Behavior: Behavior normal. Behavior is cooperative.        Thought Content: Thought content normal.        Judgment: Judgment normal.     Results for orders placed or performed in visit on 04/18/19  Magnesium  Result Value Ref Range   Magnesium 1.9 1.6 - 2.3 mg/dL  CBC with Differential/Platelet  Result Value Ref Range   WBC 5.1 3.4 - 10.8 x10E3/uL   RBC 4.45 3.77 - 5.28 x10E6/uL   Hemoglobin 13.1 11.1 - 15.9 g/dL   MCV 89 79 - 97 fL   MCH 29.4 26.6 - 33.0 pg   MCHC 33.1 31.5 - 35.7 g/dL   RDW 13.0 11.7 - 15.4 %   Platelets 246 150 - 450 x10E3/uL   Neutrophils 59 Not Estab. %   Lymphs 30 Not Estab. %   Monocytes 9 Not Estab. %   Eos 1 Not Estab. %   Basos 1 Not Estab. %   Neutrophils Absolute 3.0 1.4 - 7.0 x10E3/uL   Lymphocytes Absolute 1.5 0.7 - 3.1 x10E3/uL   Monocytes Absolute 0.4 0.1 - 0.9 x10E3/uL   EOS (ABSOLUTE) 0.0 0.0 - 0.4 x10E3/uL   Basophils Absolute 0.0 0.0 - 0.2 x10E3/uL    Immature Granulocytes 0 Not Estab. %   Immature Grans (Abs) 0.0 0.0 - 0.1 x10E3/uL  Comprehensive metabolic panel  Result Value Ref Range   Glucose 88 65 - 99 mg/dL   BUN 13 6 - 24 mg/dL   Creatinine, Ser 0.92 0.57 - 1.00 mg/dL   GFR calc non Af Amer 70 >59 mL/min/1.73   GFR calc Af Amer 80 >59 mL/min/1.73   BUN/Creatinine Ratio 14 9 - 23   Sodium 140 134 - 144 mmol/L   Potassium 4.4 3.5 - 5.2 mmol/L   Chloride 105 96 - 106 mmol/L   CO2 22 20 - 29 mmol/L   Calcium 9.2 8.7 - 10.2 mg/dL   Total Protein 6.4 6.0 -  8.5 g/dL   Albumin 4.2 3.8 - 4.9 g/dL   Globulin, Total 2.2 1.5 - 4.5 g/dL   Albumin/Globulin Ratio 1.9 1.2 - 2.2   Bilirubin Total 0.2 0.0 - 1.2 mg/dL   Alkaline Phosphatase 110 39 - 117 IU/L   AST 14 0 - 40 IU/L   ALT 12 0 - 32 IU/L  Anemia panel  Result Value Ref Range   Total Iron Binding Capacity 294 250 - 450 ug/dL   UIBC 218 131 - 425 ug/dL   Iron 76 27 - 159 ug/dL   Iron Saturation 26 15 - 55 %   Vitamin B-12 366 232 - 1,245 pg/mL   Folate, Hemolysate 339.0 Not Estab. ng/mL   Hematocrit 39.6 34.0 - 46.6 %   Folate, RBC 856 >498 ng/mL   Ferritin 39 15 - 150 ng/mL   Retic Ct Pct 1.6 0.6 - 2.6 %  TSH  Result Value Ref Range   TSH 5.010 (H) 0.450 - 4.500 uIU/mL  HgB A1c  Result Value Ref Range   Hgb A1c MFr Bld 5.4 4.8 - 5.6 %   Est. average glucose Bld gHb Est-mCnc 108 mg/dL      Assessment & Plan:   Problem List Items Addressed This Visit      Endocrine   Hypothyroid    Chronic, ongoing.  Continue current medication regimen and adjust as needed based on labs.  Thyroid panel outpatient to recheck.      Relevant Orders   Thyroid Panel With TSH     Nervous and Auditory   Neuropathy - Primary    Ongoing to bilateral legs and in morning in hands.  Obtain labs CRP and ESR.  Continue Gabapentin 100 MG at night + daily magnesium.  Referral to neurology for further testing and recommendations.  Recommend Tylenol as needed at home for discomfort and  frequent stretching + increased hydration.  Adjust plan of care as needed. Return in 4 weeks for follow-up or sooner for worsening discomfort.  May consider change from Lexapro to Duloxetine at next visit, may benefit both neuropathy and mood.      Relevant Orders   Sed Rate (ESR)   C-reactive protein      I discussed the assessment and treatment plan with the patient. The patient was provided an opportunity to ask questions and all were answered. The patient agreed with the plan and demonstrated an understanding of the instructions.   The patient was advised to call back or seek an in-person evaluation if the symptoms worsen or if the condition fails to improve as anticipated.   I provided 15 minutes of time during this encounter.  Follow up plan: Return in about 4 weeks (around 06/20/2019) for Neuropathy.

## 2019-05-23 NOTE — Assessment & Plan Note (Signed)
Chronic, ongoing.  Continue current medication regimen and adjust as needed based on labs.  Thyroid panel outpatient to recheck.

## 2019-05-23 NOTE — Assessment & Plan Note (Signed)
Ongoing to bilateral legs and in morning in hands.  Obtain labs CRP and ESR.  Continue Gabapentin 100 MG at night + daily magnesium.  Referral to neurology for further testing and recommendations.  Recommend Tylenol as needed at home for discomfort and frequent stretching + increased hydration.  Adjust plan of care as needed. Return in 4 weeks for follow-up or sooner for worsening discomfort.  May consider change from Lexapro to Duloxetine at next visit, may benefit both neuropathy and mood.

## 2019-05-23 NOTE — Patient Instructions (Signed)

## 2019-05-30 NOTE — Progress Notes (Signed)
Patient is coming in for follow up. She has mentioned some neuropathy but her PCP is referring her to neurology.

## 2019-05-31 ENCOUNTER — Inpatient Hospital Stay: Payer: 59 | Attending: Oncology | Admitting: Oncology

## 2019-05-31 ENCOUNTER — Inpatient Hospital Stay: Payer: 59

## 2019-05-31 ENCOUNTER — Other Ambulatory Visit: Payer: Self-pay

## 2019-05-31 VITALS — BP 110/80 | HR 77 | Temp 97.6°F | Ht 66.0 in | Wt 182.0 lb

## 2019-05-31 DIAGNOSIS — Z801 Family history of malignant neoplasm of trachea, bronchus and lung: Secondary | ICD-10-CM | POA: Diagnosis not present

## 2019-05-31 DIAGNOSIS — C8338 Diffuse large B-cell lymphoma, lymph nodes of multiple sites: Secondary | ICD-10-CM | POA: Diagnosis not present

## 2019-05-31 DIAGNOSIS — R591 Generalized enlarged lymph nodes: Secondary | ICD-10-CM | POA: Diagnosis not present

## 2019-05-31 DIAGNOSIS — Z803 Family history of malignant neoplasm of breast: Secondary | ICD-10-CM | POA: Insufficient documentation

## 2019-05-31 DIAGNOSIS — G620 Drug-induced polyneuropathy: Secondary | ICD-10-CM | POA: Diagnosis not present

## 2019-05-31 DIAGNOSIS — Z8249 Family history of ischemic heart disease and other diseases of the circulatory system: Secondary | ICD-10-CM | POA: Diagnosis not present

## 2019-05-31 DIAGNOSIS — Z08 Encounter for follow-up examination after completed treatment for malignant neoplasm: Secondary | ICD-10-CM

## 2019-05-31 DIAGNOSIS — Z8579 Personal history of other malignant neoplasms of lymphoid, hematopoietic and related tissues: Secondary | ICD-10-CM

## 2019-05-31 DIAGNOSIS — Z79899 Other long term (current) drug therapy: Secondary | ICD-10-CM | POA: Diagnosis not present

## 2019-05-31 LAB — CBC WITH DIFFERENTIAL/PLATELET
Abs Immature Granulocytes: 0.02 10*3/uL (ref 0.00–0.07)
Basophils Absolute: 0 10*3/uL (ref 0.0–0.1)
Basophils Relative: 0 %
Eosinophils Absolute: 0 10*3/uL (ref 0.0–0.5)
Eosinophils Relative: 1 %
HCT: 39.8 % (ref 36.0–46.0)
Hemoglobin: 13 g/dL (ref 12.0–15.0)
Immature Granulocytes: 0 %
Lymphocytes Relative: 23 %
Lymphs Abs: 1.4 10*3/uL (ref 0.7–4.0)
MCH: 29.1 pg (ref 26.0–34.0)
MCHC: 32.7 g/dL (ref 30.0–36.0)
MCV: 89.2 fL (ref 80.0–100.0)
Monocytes Absolute: 0.4 10*3/uL (ref 0.1–1.0)
Monocytes Relative: 7 %
Neutro Abs: 4.1 10*3/uL (ref 1.7–7.7)
Neutrophils Relative %: 69 %
Platelets: 252 10*3/uL (ref 150–400)
RBC: 4.46 MIL/uL (ref 3.87–5.11)
RDW: 12.5 % (ref 11.5–15.5)
WBC: 6 10*3/uL (ref 4.0–10.5)
nRBC: 0 % (ref 0.0–0.2)

## 2019-05-31 LAB — COMPREHENSIVE METABOLIC PANEL
ALT: 19 U/L (ref 0–44)
AST: 18 U/L (ref 15–41)
Albumin: 4.4 g/dL (ref 3.5–5.0)
Alkaline Phosphatase: 100 U/L (ref 38–126)
Anion gap: 8 (ref 5–15)
BUN: 13 mg/dL (ref 6–20)
CO2: 27 mmol/L (ref 22–32)
Calcium: 9.6 mg/dL (ref 8.9–10.3)
Chloride: 105 mmol/L (ref 98–111)
Creatinine, Ser: 0.93 mg/dL (ref 0.44–1.00)
GFR calc Af Amer: >60 mL/min (ref >60)
GFR calc non Af Amer: >60 mL/min (ref >60)
Glucose, Bld: 104 mg/dL — ABNORMAL HIGH (ref 70–99)
Potassium: 4.3 mmol/L (ref 3.5–5.1)
Sodium: 140 mmol/L (ref 135–145)
Total Bilirubin: 0.4 mg/dL (ref 0.3–1.2)
Total Protein: 7.3 g/dL (ref 6.5–8.1)

## 2019-05-31 LAB — LACTATE DEHYDROGENASE: LDH: 133 U/L (ref 98–192)

## 2019-06-01 ENCOUNTER — Encounter: Payer: Self-pay | Admitting: Oncology

## 2019-06-01 NOTE — Progress Notes (Signed)
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 s/p 3 cycles of RCHOPand IFRT   Chief complaint/ Reason for visit-routine follow-up of DLBCL  Heme/Onc history: 1. Patient is 58 year old female who presented to her primary care doctor with symptoms of right neck swelling in January 2018. Her symptoms of an ongoing since December 2017. Patient works at a [employer] (medial in the swelling but the swelling did not go away. She was then referred to ENT.   2. CT of the soft tissue neck with contrast showed malignant right leg lymphadenopathy with heterogeneous enhancement and extracapsular extension occupying both the right level II and level III nodal stations. Individually B abnormal lymph nodes measure up to 4.2 cm in largest dimension and the conglomerulationof abnormal lymph nodes is 5-5.5 cm across. There is a small but asymmetric and conspicuity 7 mm lymph node along the lower right 3-Bstation. Mass effect on the right carotid space from the abnormal nodes but the major vascular structures in the neck at the skull base including the right IJ remained patent.  3. 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 Cells were CD20 positive and BCL 6 and partially dim BCL-2 positive. B cells were negative for C myc (10%). Mom one was positive in 50% of the cells. Ki-67 was 50%.Bone marrow biopsy was negative for lymphoma involvement  4. PET/CT showed IMPRESSION: 1. FDG avid malignancy is identified in the level 2 and 3  nodal stations of the right cervical lymph node chain, consistent with the patient's known lymphoma. 2. Focal uptake in the gastric cardia is suspicious for malignancy as well. Recommend direct visualization and biopsy as clinically Warranted.  5. EGD showed diffuse moderately erythematous mucosa without bleeding initial nonbleeding gastric ulcer 6 mm which was biopsied. Findings were consistent with H. pylori gastritis and patient was started on antibiotictherapy for the same. Repeat H pylori testing negative  6. EUS also showed H pylori. There were few B cells noted in lamina propria and clonality studies have been ordered. Initial testing for B cell clonality showed no monoclonal B-cell population. Confirmation testing also did not reveal clonal B cells  7. PET/CT scan after cycles of RCHOP showed: IMPRESSION: 1. Complete metabolic response. No metabolically active lymphoma. 2. Uniform low-level marrow hypermetabolism throughout the axial skeleton, compatible with reactive marrow state due to chemotherapy. 3. No residual gastric hypermetabolism  8. She completed3 cycles of RCHOP between feb 2018-aoril 2018 followed byIFRTto her neck in June 2018  Interval history-she has been putting on weight which has been her main concern.  Also reports tingling numbness mainly in her feet but also in her hands.  She has been on gabapentin for this but symptoms have not improved significantly.  She does report some sweating at night which she has had for a long time but denies any drenching night sweats.  ECOG PS- 0 Pain scale- 0  Review of systems- Review of Systems  Constitutional: Negative for chills, fever, malaise/fatigue and weight loss.  HENT: Negative for congestion, ear discharge and nosebleeds.   Eyes: Negative for blurred  vision.  Respiratory: Negative for cough, hemoptysis, sputum production, shortness of breath and wheezing.   Cardiovascular: Negative for chest pain,  palpitations, orthopnea and claudication.  Gastrointestinal: Negative for abdominal pain, blood in stool, constipation, diarrhea, heartburn, melena, nausea and vomiting.  Genitourinary: Negative for dysuria, flank pain, frequency, hematuria and urgency.  Musculoskeletal: Negative for back pain, joint pain and myalgias.  Skin: Negative for rash.  Neurological: Negative for dizziness, tingling, focal weakness, seizures, weakness and headaches.  Endo/Heme/Allergies: Does not bruise/bleed easily.  Psychiatric/Behavioral: Negative for depression and suicidal ideas. The patient does not have insomnia.        No Known Allergies   Past Medical History:  Diagnosis Date  . B-cell lymphoma ([place])   . Cancer (H[facility])    lymphoma, non hodgekins B cell  . Chest pain 12/08/2016  . Chest pain 12/08/2016  . Dehydration 12/08/2016  . GERD (gastroesophageal reflux disease)      Past Surgical History:  Procedure Laterality Date  . ABDOMINAL HYSTERECTOMY    . CESAREAN SECTION     x 2   . COLONOSCOPY WITH PROPOFOL N/A 09/28/2017   Procedure: COLONOSCOPY WITH PROPOFOL;  Surgeon: Jonathon Bellows, MD;  Location: [facility] ENDOSCOPY;  Service: Gastroenterology;  Laterality: N/A;  . ESOPHAGOGASTRODUODENOSCOPY (EGD) WITH PROPOFOL N/A 11/11/2016   Procedure: ESOPHAGOGASTRODUODENOSCOPY (EGD) WITH PROPOFOL;  Surgeon: Jonathon Bellows, MD;  Location: [facility] ENDOSCOPY;  Service: Endoscopy;  Laterality: N/A;  . IR GENERIC HISTORICAL  11/03/2016   IR FLUORO GUIDE PORT INSERTION RIGHT 11/03/2016 Aletta Edouard, MD [facility]-INTERV RAD  . IR REMOVAL TUN ACCESS W/ PORT W/O FL MOD SED  02/15/2017  . REFRACTIVE SURGERY  09/2015    Social History   Socioeconomic History  . Marital status: Single    Spouse name: Not on file  . Number of children: Not on file  . Years of education: Not on file  . Highest education level: Not on file  Occupational History  . Not on file  Social Needs  . Financial resource strain: Not on file  . Food  insecurity    Worry: Not on file    Inability: Not on file  . Transportation needs    Medical: Not on file    Non-medical: Not on file  Tobacco Use  . Smoking status: Never Smoker  . Smokeless tobacco: Never Used  Substance and Sexual Activity  . Alcohol use: No  . Drug use: No  . Sexual activity: Yes  Lifestyle  . Physical activity    Days per week: Not on file    Minutes per session: Not on file  . Stress: Not on file  Relationships  . Social Herbalist on phone: Not on file    Gets together: Not on file    Attends religious service: Not on file    Active member of club or organization: Not on file    Attends meetings of clubs or organizations: Not on file    Relationship status: Not on file  . Intimate partner violence    Fear of current or ex partner: Not on file    Emotionally abused: Not on file    Physically abused: Not on file    Forced sexual activity: Not on file  Other Topics Concern  . Not on file  Social History Narrative  . Not on file    Family History  Problem Relation Age of Onset  . Cancer Mother        breast  .  Cancer Father        lung  . Heart disease Brother 25       MI  . Heart disease Paternal Grandfather        MI     Current Outpatient Medications:  .  escitalopram (LEXAPRO) 5 MG tablet, Take 1 tablet (5 mg total) by mouth daily., Disp: 90 tablet, Rfl: 3 .  gabapentin (NEURONTIN) 100 MG capsule, Take 1 capsule (100 mg total) by mouth at bedtime., Disp: 90 capsule, Rfl: 3 .  levothyroxine (SYNTHROID, LEVOTHROID) 50 MCG tablet, Take 1 tablet (50 mcg total) by mouth daily., Disp: 90 tablet, Rfl: 2 .  YUVAFEM 10 MCG TABS vaginal tablet, 1 (ONE) TABLET TWICE WEEKLY, Disp: , Rfl:   Physical exam:  Vitals:   05/31/19 1033  BP: 110/80  Pulse: 77  Temp: 97.6 F (36.4 C)  TempSrc: Tympanic  Weight: 182 lb (82.6 kg)  Height: _0  (1.676 m)   Physical Exam Constitutional:      General: She is not in acute distress. HENT:      Head: Normocephalic and atraumatic.  Eyes:     Pupils: Pupils are equal, round, and reactive to light.  Neck:     Musculoskeletal: Normal range of motion.  Cardiovascular:     Rate and Rhythm: Normal rate and regular rhythm.     Heart sounds: Normal heart sounds.  Pulmonary:     Effort: Pulmonary effort is normal.     Breath sounds: Normal breath sounds.  Abdominal:     General: Bowel sounds are normal.     Palpations: Abdomen is soft.  Lymphadenopathy:     Comments: No palpable cervical, supraclavicular, axillary or inguinal adenopathy   Skin:    General: Skin is warm and dry.  Neurological:     Mental Status: She is alert and oriented to person, place, and time.      CMP Latest Ref Rng & Units 05/31/2019  Glucose 70 - 99 mg/dL 104(H)  BUN 6 - 20 mg/dL 13  Creatinine 0.44 - 1.00 mg/dL 0.93  Sodium 135 - 145 mmol/L 140  Potassium 3.5 - 5.1 mmol/L 4.3  Chloride 98 - 111 mmol/L 105  CO2 22 - 32 mmol/L 27  Calcium 8.9 - 10.3 mg/dL 9.6  Total Protein 6.5 - 8.1 g/dL 7.3  Total Bilirubin 0.3 - 1.2 mg/dL 0.4  Alkaline Phos 38 - 126 U/L 100  AST 15 - 41 U/L 18  ALT 0 - 44 U/L 19   CBC Latest Ref Rng & Units 05/31/2019  WBC 4.0 - 10.5 K/uL 6.0  Hemoglobin 12.0 - 15.0 g/dL 13.0  Hematocrit 36.0 - 46.0 % 39.8  Platelets 150 - 400 K/uL 252     Assessment and plan- Patient is a 58 y.o. female h/o StageI DLBCLdouble expressor s/p 3 cycles of RCHOPand IFRTcurrently in remission.  She is here for routine surveillance of her DLBCL  CBC with differential, CMP and LDH were normal.  Clinically she is doing well and no concern for recurrence based on today's exam.  She has been a little over 2 years since her lymphoma.  She remains in remission.  I will see her back in 6 months with CBC with differential, CMP and LDH  Peripheral neuropathy: Patient did not have any symptoms of peripheral neuropathy during her chemotherapy.  It would be unusual for chemo-induced neuropathy to  manifest 2 years after completing treatment.  She has an upcoming appointment with neurology as well.  We have given her information about acupuncture to see if that will help her with her symptoms.   Visit Diagnosis 1. Encounter for follow-up surveillance of diffuse large B-cell lymphoma      Dr. Randa Evens, MD, MPH [facility] at [facility] [phone] 06/01/2019 2:18 PM

## 2019-06-27 ENCOUNTER — Ambulatory Visit: Payer: 59 | Admitting: Nurse Practitioner

## 2019-06-27 ENCOUNTER — Other Ambulatory Visit: Payer: Self-pay

## 2019-06-27 ENCOUNTER — Encounter: Payer: Self-pay | Admitting: Nurse Practitioner

## 2019-06-27 VITALS — BP 110/79 | HR 74 | Temp 98.3°F | Ht 66.0 in | Wt 182.0 lb

## 2019-06-27 DIAGNOSIS — G629 Polyneuropathy, unspecified: Secondary | ICD-10-CM

## 2019-06-27 DIAGNOSIS — E039 Hypothyroidism, unspecified: Secondary | ICD-10-CM | POA: Diagnosis not present

## 2019-06-27 DIAGNOSIS — R5383 Other fatigue: Secondary | ICD-10-CM | POA: Diagnosis not present

## 2019-06-27 NOTE — Assessment & Plan Note (Signed)
In patient with hypothyroid, check panel today.  EKG in office performed.  Will obtain BNP and A1C.  Continue to collaborate with oncology team.  Return in 6 weeks.

## 2019-06-27 NOTE — Patient Instructions (Signed)
Healthy Eating Following a healthy eating pattern may help you to achieve and maintain a healthy body weight, reduce the risk of chronic disease, and live a long and productive life. It is important to follow a healthy eating pattern at an appropriate calorie level for your body. Your nutritional needs should be met primarily through food by choosing a variety of nutrient-rich foods. What are tips for following this plan? Reading food labels  Read labels and choose the following: ? Reduced or low sodium. ? Juices with 100% fruit juice. ? Foods with low saturated fats and high polyunsaturated and monounsaturated fats. ? Foods with whole grains, such as whole wheat, cracked wheat, brown rice, and wild rice. ? Whole grains that are fortified with folic acid. This is recommended for women who are pregnant or who want to become pregnant.  Read labels and avoid the following: ? Foods with a lot of added sugars. These include foods that contain brown sugar, corn sweetener, corn syrup, dextrose, fructose, glucose, high-fructose corn syrup, honey, invert sugar, lactose, malt syrup, maltose, molasses, raw sugar, sucrose, trehalose, or turbinado sugar.  Do not eat more than the following amounts of added sugar per day:  6 teaspoons (25 g) for women.  9 teaspoons (38 g) for men. ? Foods that contain processed or refined starches and grains. ? Refined grain products, such as white flour, degermed cornmeal, white bread, and white rice. Shopping  Choose nutrient-rich snacks, such as vegetables, whole fruits, and nuts. Avoid high-calorie and high-sugar snacks, such as potato chips, fruit snacks, and candy.  Use oil-based dressings and spreads on foods instead of solid fats such as butter, stick margarine, or cream cheese.  Limit pre-made sauces, mixes, and "instant" products such as flavored rice, instant noodles, and ready-made pasta.  Try more plant-protein sources, such as tofu, tempeh, black beans,  edamame, lentils, nuts, and seeds.  Explore eating plans such as the Mediterranean diet or vegetarian diet. Cooking  Use oil to saut or stir-fry foods instead of solid fats such as butter, stick margarine, or lard.  Try baking, boiling, grilling, or broiling instead of frying.  Remove the fatty part of meats before cooking.  Steam vegetables in water or broth. Meal planning   At meals, imagine dividing your plate into fourths: ? One-half of your plate is fruits and vegetables. ? One-fourth of your plate is whole grains. ? One-fourth of your plate is protein, especially lean meats, poultry, eggs, tofu, beans, or nuts.  Include low-fat dairy as part of your daily diet. Lifestyle  Choose healthy options in all settings, including home, work, school, restaurants, or stores.  Prepare your food safely: ? Wash your hands after handling raw meats. ? Keep food preparation surfaces clean by regularly washing with hot, soapy water. ? Keep raw meats separate from ready-to-eat foods, such as fruits and vegetables. ? Cook seafood, meat, poultry, and eggs to the recommended internal temperature. ? Store foods at safe temperatures. In general:  Keep cold foods at 59F (4.4C) or below.  Keep hot foods at 159F (60C) or above.  Keep your freezer at [facility] (-17.8C) or below.  Foods are no longer safe to eat when they have been between the temperatures of 40-159F (4.4-60C) for more than 2 hours. What foods should I eat? Fruits Aim to eat 2 cup-equivalents of fresh, canned (in natural juice), or frozen fruits each day. Examples of 1 cup-equivalent of fruit include 1 small apple, 8 large strawberries, 1 cup canned fruit,  cup  dried fruit, or 1 cup 100% juice. Vegetables Aim to eat 2-3 cup-equivalents of fresh and frozen vegetables each day, including different varieties and colors. Examples of 1 cup-equivalent of vegetables include 2 medium carrots, 2 cups raw, leafy greens, 1 cup chopped  vegetable (raw or cooked), or 1 medium baked potato. Grains Aim to eat 6 ounce-equivalents of whole grains each day. Examples of 1 ounce-equivalent of grains include 1 slice of bread, 1 cup ready-to-eat cereal, 3 cups popcorn, or  cup cooked rice, pasta, or cereal. Meats and other proteins Aim to eat 5-6 ounce-equivalents of protein each day. Examples of 1 ounce-equivalent of protein include 1 egg, 1/2 cup nuts or seeds, or 1 tablespoon (16 g) peanut butter. A cut of meat or fish that is the size of a deck of cards is about 3-4 ounce-equivalents.  Of the protein you eat each week, try to have at least 8 ounces come from seafood. This includes salmon, trout, herring, and anchovies. Dairy Aim to eat 3 cup-equivalents of fat-free or low-fat dairy each day. Examples of 1 cup-equivalent of dairy include 1 cup (240 mL) milk, 8 ounces (250 g) yogurt, 1 ounces (44 g) natural cheese, or 1 cup (240 mL) fortified soy milk. Fats and oils  Aim for about 5 teaspoons (21 g) per day. Choose monounsaturated fats, such as canola and olive oils, avocados, peanut butter, and most nuts, or polyunsaturated fats, such as sunflower, corn, and soybean oils, walnuts, pine nuts, sesame seeds, sunflower seeds, and flaxseed. Beverages  Aim for six 8-oz glasses of water per day. Limit coffee to three to five 8-oz cups per day.  Limit caffeinated beverages that have added calories, such as soda and energy drinks.  Limit alcohol intake to no more than 1 drink a day for nonpregnant women and 2 drinks a day for men. One drink equals 12 oz of beer (355 mL), 5 oz of wine (148 mL), or 1 oz of hard liquor (44 mL). Seasoning and other foods  Avoid adding excess amounts of salt to your foods. Try flavoring foods with herbs and spices instead of salt.  Avoid adding sugar to foods.  Try using oil-based dressings, sauces, and spreads instead of solid fats. This information is based on general [place] nutrition guidelines. For more  information, visit choosemyplate.gov. Exact amounts may vary based on your nutrition needs. Summary  A healthy eating plan may help you to maintain a healthy weight, reduce the risk of chronic diseases, and stay active throughout your life.  Plan your meals. Make sure you eat the right portions of a variety of nutrient-rich foods.  Try baking, boiling, grilling, or broiling instead of frying.  Choose healthy options in all settings, including home, work, school, restaurants, or stores. This information is not intended to replace advice given to you by your health care provider. Make sure you discuss any questions you have with your health care provider. Document Released: 12/19/2017 Document Revised: 12/19/2017 Document Reviewed: 12/19/2017 Elsevier Patient Education  2020 [employer]  

## 2019-06-27 NOTE — Progress Notes (Signed)
BP 110/79   Pulse 74   Temp 98.3 F (36.8 C) (Oral)   Ht '5\' 6"'  (1.676 m)   Wt 182 lb (82.6 kg)   LMP  (LMP Unknown)   SpO2 97%   BMI 29.38 kg/m    Subjective:    Patient ID: Christina [name], female    DOB: November 11, 1960, 58 y.o.   MRN: [id number]  HPI: Christina [name] is a 58 y.o. female  Chief Complaint  Patient presents with  . Neuropahty    f/u  . Hypothyroidism   HYPOTHYROIDISM Levothyroxine 50 MCG daily.  Has been taking this at night, at same time as Gabapentin.  Educated on proper use of taking this medication.   Does endorse weight gain and fatigue.  States noticing some shortness of breath that has been gradually coming on over past few months, notices this going up hills and going up stairs.  This goes along with her fatigue she reports & she feels it has to do with her weight gain.  Denies any recent URI symptoms.  Denies CP, orthopnea, or cough.  No loss of taste or smell. Thyroid control status:stable Satisfied with current treatment? 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 Was started on Gabapentin at recent visit in July. States her mother has neuropathy, which also started years after her chemotherapy. On review of oncology note, this is not suspected to be related to chemo due to being years out.  She is scheduled to do acupuncture program. No history of tick bites. Has taken Gabapentin in past, had to take 100 MG only at night as this was less sedating.  Does not wish to try going up on dose at this time due to trying increased doses in past and feeling very tired.  Feels current dose is  somewhat beneficial.  Most recent sensation right foot 6/10 and left 7/10.  She is scheduled for visit with neurology on 07/11/19, Dr. Manuella Ghazi. Duration:months Pain:yes Severity:5/10 Quality:aching, burning and cramping Location:from ankles to upper legs, posterior and anterior Bilateral:yes Onset:gradual Frequency:constant Time of day: at random, better at night time and worse during daytime Sudden unintentional leg jerking:yes Paresthesias:yes Decreased sensation:no Weakness:some weakness if on feet for long period Insomnia:no Fatigue:no Alleviating factors:nothing Aggravating factors:unknown, can not think of anything Status:fluctuating Treatments attempted:Tylenol  Relevant past medical, surgical, family and social history reviewed and updated as indicated. Interim medical history since our last visit reviewed. Allergies and medications reviewed and updated.  Review of Systems  Constitutional: Positive for fatigue. Negative for activity change, appetite change, diaphoresis and fever.  Respiratory: Positive for shortness of breath (occasional). Negative for cough, chest tightness and wheezing.   Cardiovascular: Negative for chest pain, palpitations and leg swelling.  Gastrointestinal: Negative for abdominal distention, abdominal pain, constipation, diarrhea, nausea and vomiting.  Endocrine: Negative for cold intolerance, heat intolerance, polydipsia, polyphagia and polyuria.  Neurological: Negative for dizziness, syncope, weakness, light-headedness, numbness and headaches.  Psychiatric/Behavioral: Negative.     Per HPI unless specifically indicated above     Objective:    BP 110/79   Pulse 74   Temp 98.3 F (36.8 C) (Oral)   Ht '5\' 6"'  (1.676 m)  Wt 182 lb (82.6 kg)   LMP  (LMP Unknown)   SpO2 97%   BMI 29.38 kg/m   Wt Readings from Last 3 Encounters:  06/27/19 182 lb (82.6 kg)  05/31/19 182 lb (82.6 kg)  11/20/18 172 lb 11.2 oz  (78.3 kg)    Physical Exam Vitals signs and nursing note reviewed.  Constitutional:      General: She is awake. She is not in acute distress.    Appearance: She is well-developed and overweight. She is not ill-appearing.  HENT:     Head: Normocephalic.     Right Ear: Hearing normal.     Left Ear: Hearing normal.  Eyes:     General: Lids are normal.        Right eye: No discharge.        Left eye: No discharge.     Conjunctiva/sclera: Conjunctivae normal.     Pupils: Pupils are equal, round, and reactive to light.  Neck:     Musculoskeletal: Normal range of motion and neck supple.     Thyroid: No thyromegaly.     Vascular: No carotid bruit.  Cardiovascular:     Rate and Rhythm: Normal rate and regular rhythm.     Heart sounds: Normal heart sounds. No murmur. No gallop.   Pulmonary:     Effort: Pulmonary effort is normal. No accessory muscle usage or respiratory distress.     Breath sounds: Normal breath sounds.     Comments: No SOB with talking.  No cough noted.   Abdominal:     General: Bowel sounds are normal.     Palpations: Abdomen is soft.  Musculoskeletal:     Right lower leg: No edema.     Left lower leg: No edema.  Lymphadenopathy:     Cervical: No cervical adenopathy.  Skin:    General: Skin is warm and dry.  Neurological:     Mental Status: She is alert and oriented to person, place, and time.  Psychiatric:        Attention and Perception: Attention normal.        Mood and Affect: Mood normal.        Behavior: Behavior normal. Behavior is cooperative.        Thought Content: Thought content normal.        Judgment: Judgment normal.    EKG performed noting NSR rate of 67.  Results for orders placed or performed in visit on 05/31/19  Lactate dehydrogenase  Result Value Ref Range   LDH 133 98 - 192 U/L  Comprehensive metabolic panel  Result Value Ref Range   Sodium 140 135 - 145 mmol/L   Potassium 4.3 3.5 - 5.1 mmol/L   Chloride 105 98 - 111 mmol/L    CO2 27 22 - 32 mmol/L   Glucose, Bld 104 (H) 70 - 99 mg/dL   BUN 13 6 - 20 mg/dL   Creatinine, Ser 0.93 0.44 - 1.00 mg/dL   Calcium 9.6 8.9 - 10.3 mg/dL   Total Protein 7.3 6.5 - 8.1 g/dL   Albumin 4.4 3.5 - 5.0 g/dL   AST 18 15 - 41 U/L   ALT 19 0 - 44 U/L   Alkaline Phosphatase 100 38 - 126 U/L   Total Bilirubin 0.4 0.3 - 1.2 mg/dL   GFR calc non Af Amer >60 >60 mL/min   GFR calc Af Amer >60 >60 mL/min   Anion gap 8 5 - 15  CBC with Differential  Result Value Ref Range   WBC 6.0 4.0 - 10.5 K/uL   RBC 4.46 3.87 - 5.11 MIL/uL   Hemoglobin 13.0 12.0 - 15.0 g/dL   HCT 39.8 36.0 - 46.0 %   MCV 89.2 80.0 - 100.0 fL   MCH 29.1 26.0 - 34.0 pg   MCHC 32.7 30.0 - 36.0 g/dL   RDW 12.5 11.5 - 15.5 %   Platelets 252 150 - 400 K/uL   nRBC 0.0 0.0 - 0.2 %   Neutrophils Relative % 69 %   Neutro Abs 4.1 1.7 - 7.7 K/uL   Lymphocytes Relative 23 %   Lymphs Abs 1.4 0.7 - 4.0 K/uL   Monocytes Relative 7 %   Monocytes Absolute 0.4 0.1 - 1.0 K/uL   Eosinophils Relative 1 %   Eosinophils Absolute 0.0 0.0 - 0.5 K/uL   Basophils Relative 0 %   Basophils Absolute 0.0 0.0 - 0.1 K/uL   Immature Granulocytes 0 %   Abs Immature Granulocytes 0.02 0.00 - 0.07 K/uL      Assessment & Plan:   Problem List Items Addressed This Visit      Endocrine   Hypothyroid - Primary    Chronic, ongoing.  Continue current medication regimen and adjust as needed based on labs.  Thyroid panel today, as increased fatigue/weight gain/and intermittent SOB.          Nervous and Auditory   Neuropathy    Ongoing to bilateral legs and in morning in hands.  Obtain labs CRP and ESR.  Continue Gabapentin 100 MG at night + daily magnesium.  Recommend Tylenol as needed at home for discomfort and frequent stretching + increased hydration.  Adjust plan of care as needed. Return in 6 weeks for follow-up or sooner for worsening discomfort.  May consider change from Lexapro to Duloxetine at next visit, may benefit both neuropathy  and mood.        Other   Fatigue    In patient with hypothyroid, check panel today.  EKG in office performed.  Will obtain BNP and A1C.  Continue to collaborate with oncology team.  Return in 6 weeks.      Relevant Orders   B Nat Peptide   HgB A1c   EKG 12-Lead (Completed)       Follow up plan: Return in about 6 weeks (around 08/08/2019) for Weight loss, thyroid, fatigue.

## 2019-06-27 NOTE — Assessment & Plan Note (Signed)
Ongoing to bilateral legs and in morning in hands.  Obtain labs CRP and ESR.  Continue Gabapentin 100 MG at night + daily magnesium.  Recommend Tylenol as needed at home for discomfort and frequent stretching + increased hydration.  Adjust plan of care as needed. Return in 6 weeks for follow-up or sooner for worsening discomfort.  May consider change from Lexapro to Duloxetine at next visit, may benefit both neuropathy and mood.

## 2019-06-27 NOTE — Assessment & Plan Note (Signed)
Chronic, ongoing.  Continue current medication regimen and adjust as needed based on labs.  Thyroid panel today, as increased fatigue/weight gain/and intermittent SOB.

## 2019-06-28 LAB — THYROID PANEL WITH TSH
Free Thyroxine Index: 1.6 (ref 1.2–4.9)
T3 Uptake Ratio: 24 % (ref 24–39)
T4, Total: 6.7 ug/dL (ref 4.5–12.0)
TSH: 3.44 u[IU]/mL (ref 0.450–4.500)

## 2019-06-28 LAB — C-REACTIVE PROTEIN: CRP: 2 mg/L (ref 0–10)

## 2019-06-28 LAB — SEDIMENTATION RATE: Sed Rate: 14 mm/hr (ref 0–40)

## 2019-06-28 LAB — HEMOGLOBIN A1C
Est. average glucose Bld gHb Est-mCnc: 114 mg/dL
Hgb A1c MFr Bld: 5.6 % (ref 4.8–5.6)

## 2019-06-28 LAB — BRAIN NATRIURETIC PEPTIDE: BNP: 19.7 pg/mL (ref 0.0–100.0)

## 2019-07-10 ENCOUNTER — Ambulatory Visit (INDEPENDENT_AMBULATORY_CARE_PROVIDER_SITE_OTHER): Payer: 59

## 2019-07-10 ENCOUNTER — Other Ambulatory Visit: Payer: Self-pay

## 2019-07-10 ENCOUNTER — Ambulatory Visit
Admission: EM | Admit: 2019-07-10 | Discharge: 2019-07-10 | Disposition: A | Discharge status: Home / Self Care | Payer: 59 | Attending: Family Medicine | Admitting: Family Medicine

## 2019-07-10 ENCOUNTER — Encounter: Payer: Self-pay | Admitting: Emergency Medicine

## 2019-07-10 DIAGNOSIS — S29012A Strain of muscle and tendon of back wall of thorax, initial encounter: Secondary | ICD-10-CM

## 2019-07-10 DIAGNOSIS — M25512 Pain in left shoulder: Secondary | ICD-10-CM

## 2019-07-10 DIAGNOSIS — S46912A Strain of unspecified muscle, fascia and tendon at shoulder and upper arm level, left arm, initial encounter: Secondary | ICD-10-CM
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
## 2019-07-10 NOTE — Discharge Instructions (Signed)
Rest, ice/heat, over the counter tylenol/ibuprofen as needed
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
## 2019-08-07 ENCOUNTER — Other Ambulatory Visit: Payer: Self-pay

## 2019-08-08 ENCOUNTER — Ambulatory Visit: Payer: 59 | Admitting: Nurse Practitioner

## 2019-08-15 ENCOUNTER — Ambulatory Visit (INDEPENDENT_AMBULATORY_CARE_PROVIDER_SITE_OTHER): Payer: 59 | Admitting: Nurse Practitioner

## 2019-08-15 ENCOUNTER — Encounter: Payer: Self-pay | Admitting: Nurse Practitioner

## 2019-08-15 ENCOUNTER — Other Ambulatory Visit: Payer: Self-pay

## 2019-08-15 DIAGNOSIS — G629 Polyneuropathy, unspecified: Secondary | ICD-10-CM

## 2019-08-15 MED ORDER — GABAPENTIN 100 MG PO CAPS: 100.0000 mg | ORAL_CAPSULE | Freq: Every day | ORAL | 3 refills | Status: DC

## 2019-08-15 NOTE — Assessment & Plan Note (Signed)
Ongoing to bilateral legs and in morning in hands.   Continue Gabapentin 100 MG at night, have recommended she try going up to 200 MG at night and determine if increased fatigue with this dose, + daily magnesium.  Recommend Tylenol as needed at home for discomfort and frequent stretching + increased hydration.  Adjust plan of care as needed. Return in 6 weeks for follow-up or sooner for worsening discomfort.  May consider change from Lexapro to Duloxetine at next visit, may benefit both neuropathy and mood.  Continue collaboration with her chiropractor and neuro wants establishes care there.

## 2019-08-15 NOTE — Patient Instructions (Signed)
Neuropathic Pain Neuropathic pain is pain caused by damage to the nerves that are responsible for certain sensations in your body (sensory nerves). The pain can be caused by:  Damage to the sensory nerves that send signals to your spinal cord and brain (peripheral nervous system).  Damage to the sensory nerves in your brain or spinal cord (central nervous system). Neuropathic pain can make you more sensitive to pain. Even a minor sensation can feel very painful. This is usually a long-term condition that can be difficult to treat. The type of pain differs from person to person. It may:  Start suddenly (acute), or it may develop slowly and last for a long time (chronic).  Come and go as damaged nerves heal, or it may stay at the same level for years.  Cause emotional distress, loss of sleep, and a lower quality of life. What are the causes? The most common cause of this condition is diabetes. Many other diseases and conditions can also cause neuropathic pain. Causes of neuropathic pain can be classified as:  Toxic. This is caused by medicines and chemicals. The most common cause of toxic neuropathic pain is damage from cancer treatments (chemotherapy).  Metabolic. This can be caused by: ? Diabetes. This is the most common disease that damages the nerves. ? Lack of vitamin B from long-term alcohol abuse.  Traumatic. Any injury that cuts, crushes, or stretches a nerve can cause damage and pain. A common example is feeling pain after losing an arm or leg (phantom limb pain).  Compression-related. If a sensory nerve gets trapped or compressed for a long period of time, the blood supply to the nerve can be cut off.  Vascular. Many blood vessel diseases can cause neuropathic pain by decreasing blood supply and oxygen to nerves.  Autoimmune. This type of pain results from diseases in which the body's defense system (immune system) mistakenly attacks sensory nerves. Examples of autoimmune diseases  that can cause neuropathic pain include lupus and multiple sclerosis.  Infectious. Many types of viral infections can damage sensory nerves and cause pain. Shingles infection is a common cause of this type of pain.  Inherited. Neuropathic pain can be a symptom of many diseases that are passed down through families (genetic). What increases the risk? You are more likely to develop this condition if:  You have diabetes.  You smoke.  You drink too much alcohol.  You are taking certain medicines, including medicines that kill cancer cells (chemotherapy) or that treat immune system disorders. What are the signs or symptoms? The main symptom is pain. Neuropathic pain is often described as:  Burning.  Shock-like.  Stinging.  Hot or cold.  Itching. How is this diagnosed? No single test can diagnose neuropathic pain. It is diagnosed based on:  Physical exam and your symptoms. Your health care provider will ask you about your pain. You may be asked to use a pain scale to describe how bad your pain is.  Tests. These may be done to see if you have a high sensitivity to pain and to help find the cause and location of any sensory nerve damage. They include: ? Nerve conduction studies to test how well nerve signals travel through your sensory nerves (electrodiagnostic testing). ? Stimulating your sensory nerves through electrodes on your skin and measuring the response in your spinal cord and brain (somatosensory evoked potential).  Imaging studies, such as: ? X-rays. ? CT scan. ? MRI. How is this treated? Treatment for neuropathic pain may change   over time. You may need to try different treatment options or a combination of treatments. Some options include:  Treating the underlying cause of the neuropathy, such as diabetes, kidney disease, or vitamin deficiencies.  Stopping medicines that can cause neuropathy, such as chemotherapy.  Medicine to relieve pain. Medicines may  include: ? Prescription or over-the-counter pain medicine. ? Anti-seizure medicine. ? Antidepressant medicines. ? Pain-relieving patches that are applied to painful areas of skin. ? A medicine to numb the area (local anesthetic), which can be injected as a nerve block.  Transcutaneous nerve stimulation. This uses electrical currents to block painful nerve signals. The treatment is painless.  Alternative treatments, such as: ? Acupuncture. ? Meditation. ? Massage. ? Physical therapy. ? Pain management programs. ? Counseling. Follow these instructions at home: Medicines   Take over-the-counter and prescription medicines only as told by your health care provider.  Do not drive or use heavy machinery while taking prescription pain medicine.  If you are taking prescription pain medicine, take actions to prevent or treat constipation. Your health care provider may recommend that you: ? Drink enough fluid to keep your urine pale yellow. ? Eat foods that are high in fiber, such as fresh fruits and vegetables, whole grains, and beans. ? Limit foods that are high in fat and processed sugars, such as fried or sweet foods. ? Take an over-the-counter or prescription medicine for constipation. Lifestyle   Have a good support system at home.  Consider joining a chronic pain support group.  Do not use any products that contain nicotine or tobacco, such as cigarettes and e-cigarettes. If you need help quitting, ask your health care provider.  Do not drink alcohol. General instructions  Learn as much as you can about your condition.  Work closely with all your health care providers to find the treatment plan that works best for you.  Ask your health care provider what activities are safe for you.  Keep all follow-up visits as told by your health care provider. This is important. Contact a health care provider if:  Your pain treatments are not working.  You are having side effects  from your medicines.  You are struggling with tiredness (fatigue), mood changes, depression, or anxiety. Summary  Neuropathic pain is pain caused by damage to the nerves that are responsible for certain sensations in your body (sensory nerves).  Neuropathic pain may come and go as damaged nerves heal, or it may stay at the same level for years.  Neuropathic pain is usually a long-term condition that can be difficult to treat. Consider joining a chronic pain support group. This information is not intended to replace advice given to you by your health care provider. Make sure you discuss any questions you have with your health care provider. Document Released: 06/03/2004 Document Revised: 12/28/2018 Document Reviewed: 09/23/2017 Elsevier Patient Education  2020 [employer]  

## 2019-08-15 NOTE — Progress Notes (Signed)
LMP  (LMP Unknown)    Subjective:    Patient ID: Christina [name], female    DOB: 1961-01-22, 58 y.o.   MRN: [id number]  HPI: Christina [name] is a 58 y.o. female  Chief Complaint  Patient presents with  . Fatigue    . This visit was completed via FaceTime due to the restrictions of the COVID-19 pandemic. All issues as above were discussed and addressed. Physical exam was done as above through visual confirmation on Facetime. If it was felt that the patient should be evaluated in the office, they were directed there. The patient verbally consented to this visit. . Location of the patient: home . Location of the provider: work . Those involved with this call:  . Provider: Marnee Guarneri, DNP . CMA: Yvonna Alanis, CMA . Front Desk/Registration: Jill Side  . Time spent on call: 15 minutes with patient face to face via video conference. More than 50% of this time was spent in counseling and coordination of care. 10 minutes total spent in review of patient's record and preparation of their chart.  . I verified patient identity using two factors (patient name and date of birth). Patient consents verbally to being seen via telemedicine visit today.    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 On review of oncology note, this is not suspected to be related to chemo due to being years out.   No history of tick bites. Has taken Gabapentin in past, had to take 100 MG only at night as this was less sedating.Does not wish to try going up on dose at this time due to trying increased doses in past and feeling very tired, reports current dose is working well.  Saw chiropractor recently who told her she could possibly "restore the thyroid" but wants close monitor of thyroid and help with neuropathy with acupuncture.  She is seeing chiropractor on  [employer]. Most recent sensation right foot 6/10 and left 7/10.  She was scheduled for visit with neurology on 07/11/19, Dr. Manuella Ghazi, but needs to reschedule due to missing. Duration:months Pain:yes Severity:5/10 Quality:aching, burning and cramping Location:from ankles to upper legs, posterior and anterior Bilateral:yes Onset:gradual Frequency:constant Time of day: at random, better at night time and worse during daytime Sudden unintentional leg jerking:yes Paresthesias:yes Decreased sensation:no Weakness:some weakness if on feet for long period Insomnia:no Fatigue:no Alleviating factors:nothing Aggravating factors:unknown, can not think of anything Status:fluctuating Treatments attempted:Tylenol  Relevant past medical, surgical, family and social history reviewed and updated as indicated. Interim medical history since our last visit reviewed. Allergies and medications reviewed and updated.  Review of Systems  Constitutional: Negative for activity change, appetite change, diaphoresis, fatigue and fever.  Respiratory: Negative for cough, chest tightness and shortness of breath.   Cardiovascular: Negative for chest pain, palpitations and leg swelling.  Gastrointestinal: Negative for abdominal distention, abdominal pain, constipation, diarrhea, nausea and vomiting.  Endocrine: Negative for cold intolerance and heat intolerance.  Musculoskeletal: Positive for arthralgias. Negative for gait problem.  Neurological: Positive for numbness. Negative for dizziness, syncope, weakness, light-headedness and headaches.  Psychiatric/Behavioral: Negative.     Per HPI unless specifically indicated above     Objective:    LMP  (LMP Unknown)   Wt Readings from Last 3 Encounters:  07/10/19 180 lb (81.6 kg)  06/27/19 182 lb (82.6 kg)  05/31/19 182 lb (82.6 kg)  Physical Exam Vitals signs and nursing note reviewed.  Constitutional:      General: She is  awake. She is not in acute distress.    Appearance: She is well-developed. She is not ill-appearing.  HENT:     Head: Normocephalic.     Right Ear: Hearing normal.     Left Ear: Hearing normal.  Eyes:     General: Lids are normal.        Right eye: No discharge.        Left eye: No discharge.     Conjunctiva/sclera: Conjunctivae normal.  Neck:     Musculoskeletal: Normal range of motion.  Pulmonary:     Effort: Pulmonary effort is normal. No accessory muscle usage or respiratory distress.  Neurological:     Mental Status: She is alert and oriented to person, place, and time.  Psychiatric:        Attention and Perception: Attention normal.        Mood and Affect: Mood normal.        Behavior: Behavior normal. Behavior is cooperative.        Thought Content: Thought content normal.        Judgment: Judgment normal.     Results for orders placed or performed in visit on 06/27/19  B Nat Peptide  Result Value Ref Range   BNP 19.7 0.0 - 100.0 pg/mL  HgB A1c  Result Value Ref Range   Hgb A1c MFr Bld 5.6 4.8 - 5.6 %   Est. average glucose Bld gHb Est-mCnc 114 mg/dL  Thyroid Panel With TSH  Result Value Ref Range   TSH 3.440 0.450 - 4.500 uIU/mL   T4, Total 6.7 4.5 - 12.0 ug/dL   T3 Uptake Ratio 24 24 - 39 %   Free Thyroxine Index 1.6 1.2 - 4.9  Sedimentation rate  Result Value Ref Range   Sed Rate 14 0 - 40 mm/hr  C-reactive protein  Result Value Ref Range   CRP 2 0 - 10 mg/L      Assessment & Plan:   Problem List Items Addressed This Visit      Nervous and Auditory   Neuropathy    Ongoing to bilateral legs and in morning in hands.   Continue Gabapentin 100 MG at night, have recommended she try going up to 200 MG at night and determine if increased fatigue with this dose, + daily magnesium.  Recommend Tylenol as needed at home for discomfort and frequent stretching + increased hydration.  Adjust plan of care as needed. Return in 6 weeks for follow-up or sooner for  worsening discomfort.  May consider change from Lexapro to Duloxetine at next visit, may benefit both neuropathy and mood.  Continue collaboration with her chiropractor and neuro wants establishes care there.          Follow up plan: Return in about 6 weeks (around 09/26/2019) for Neuropathy and hypothyroid.

## 2019-09-22 ENCOUNTER — Encounter: Payer: Self-pay | Admitting: Emergency Medicine

## 2019-09-22 ENCOUNTER — Emergency Department
Admission: EM | Admit: 2019-09-22 | Discharge: 2019-09-23 | Disposition: A | Discharge status: Home / Self Care | Payer: 59 | Attending: Emergency Medicine | Admitting: Emergency Medicine

## 2019-09-22 ENCOUNTER — Emergency Department: Payer: 59

## 2019-09-22 ENCOUNTER — Other Ambulatory Visit: Payer: Self-pay

## 2019-09-22 DIAGNOSIS — Z79899 Other long term (current) drug therapy: Secondary | ICD-10-CM | POA: Diagnosis not present

## 2019-09-22 DIAGNOSIS — K8020 Calculus of gallbladder without cholecystitis without obstruction: Secondary | ICD-10-CM | POA: Insufficient documentation

## 2019-09-22 DIAGNOSIS — R1011 Right upper quadrant pain: Secondary | ICD-10-CM | POA: Diagnosis present

## 2019-09-22 DIAGNOSIS — Z8572 Personal history of non-Hodgkin lymphomas: Secondary | ICD-10-CM | POA: Insufficient documentation

## 2019-09-22 LAB — BASIC METABOLIC PANEL
Anion gap: 10 (ref 5–15)
BUN: 16 mg/dL (ref 6–20)
CO2: 23 mmol/L (ref 22–32)
Calcium: 9.5 mg/dL (ref 8.9–10.3)
Chloride: 109 mmol/L (ref 98–111)
Creatinine, Ser: 1.03 mg/dL — ABNORMAL HIGH (ref 0.44–1.00)
GFR calc Af Amer: >60 mL/min (ref >60)
GFR calc non Af Amer: >60 mL/min (ref >60)
Glucose, Bld: 115 mg/dL — ABNORMAL HIGH (ref 70–99)
Potassium: 3.9 mmol/L (ref 3.5–5.1)
Sodium: 142 mmol/L (ref 135–145)

## 2019-09-22 LAB — CBC
HCT: 38.4 % (ref 36.0–46.0)
Hemoglobin: 13.2 g/dL (ref 12.0–15.0)
MCH: 29.1 pg (ref 26.0–34.0)
MCHC: 34.4 g/dL (ref 30.0–36.0)
MCV: 84.6 fL (ref 80.0–100.0)
Platelets: 277 10*3/uL (ref 150–400)
RBC: 4.54 MIL/uL (ref 3.87–5.11)
RDW: 12.6 % (ref 11.5–15.5)
WBC: 9.3 10*3/uL (ref 4.0–10.5)
nRBC: 0 % (ref 0.0–0.2)

## 2019-09-22 LAB — URINALYSIS, COMPLETE (UACMP) WITH MICROSCOPIC
Bacteria, UA: NONE SEEN
Bilirubin Urine: NEGATIVE
Glucose, UA: NEGATIVE mg/dL
Hgb urine dipstick: NEGATIVE
Ketones, ur: NEGATIVE mg/dL
Leukocytes,Ua: NEGATIVE
Nitrite: NEGATIVE
Protein, ur: NEGATIVE mg/dL
Specific Gravity, Urine: 1.02 (ref 1.005–1.030)
pH: 6 (ref 5.0–8.0)

## 2019-09-22 MED ORDER — MORPHINE SULFATE (PF) 2 MG/ML IV SOLN
2.0000 mg | Freq: Once | INTRAVENOUS | Status: AC
  Administered 2019-09-22: 2 mg via INTRAVENOUS
  Filled 2019-09-22: qty 1

## 2019-09-22 MED ORDER — OXYCODONE-ACETAMINOPHEN 5-325 MG PO TABS: 1.0000 | ORAL_TABLET | ORAL | 0 refills | Status: DC | PRN

## 2019-09-22 MED ORDER — MORPHINE SULFATE (PF) 4 MG/ML IV SOLN
4.0000 mg | Freq: Once | INTRAVENOUS | Status: AC
  Administered 2019-09-22: 23:00:00 4 mg via INTRAVENOUS

## 2019-09-22 MED ORDER — MORPHINE SULFATE (PF) 4 MG/ML IV SOLN
INTRAVENOUS | Status: AC
  Filled 2019-09-22: qty 1

## 2019-09-22 MED ORDER — ONDANSETRON 4 MG PO TBDP: 4.0000 mg | ORAL_TABLET | Freq: Three times a day (TID) | ORAL | 0 refills | Status: DC | PRN

## 2019-09-22 MED ORDER — ONDANSETRON HCL 4 MG/2ML IJ SOLN
4.0000 mg | Freq: Once | INTRAMUSCULAR | Status: AC
  Administered 2019-09-22: 4 mg via INTRAVENOUS
  Filled 2019-09-22: qty 2

## 2019-09-22 MED ORDER — IOHEXOL 300 MG/ML  SOLN
100.0000 mL | Freq: Once | INTRAMUSCULAR | Status: AC | PRN
  Administered 2019-09-22: 100 mL via INTRAVENOUS
  Filled 2019-09-22: qty 100

## 2019-09-22 NOTE — ED Provider Notes (Signed)
[facility] Emergency Department Provider Note  ____________________________________________   First MD Initiated Contact with Patient 09/22/19 2150     (approximate)  I have reviewed the triage vital signs and the nursing notes.   HISTORY  Chief Complaint Flank Pain   HPI Christina [name] is a 59 y.o. female with below list of previous medical conditions presents to the emergency department secondary to right upper quadrant/right flank pain which patient states has been occurring since September 14, 2019.  Patient also admits to nausea however no vomiting.  Patient denies any urinary symptoms no frequency urgency hematuria or dysuria.  Patient denies any diarrhea or constipation.       Past Medical History:  Diagnosis Date  . B-cell lymphoma ([place])   . Cancer (H[facility])    lymphoma, non hodgekins B cell  . Chest pain 12/08/2016  . Chest pain 12/08/2016  . Dehydration 12/08/2016  . GERD (gastroesophageal reflux disease)     Patient Active Problem List   Diagnosis Date Noted  . Fatigue 06/27/2019  . Neuropathy 04/18/2019  . Hot flashes due to menopause 03/21/2018  . Hypothyroid 02/08/2018  . Dehydration 12/08/2016  . Chest pain 12/08/2016  . Goals of care, counseling/discussion 11/11/2016  . 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 ABDOMINAL HYSTERECTOMY    . CESAREAN SECTION     x 2   . COLONOSCOPY WITH PROPOFOL N/A 09/28/2017   Procedure: COLONOSCOPY WITH PROPOFOL;  Surgeon: Jonathon Bellows, MD;  Location: [facility] ENDOSCOPY;  Service: Gastroenterology;  Laterality: N/A;  . ESOPHAGOGASTRODUODENOSCOPY (EGD) WITH PROPOFOL N/A 11/11/2016   Procedure: ESOPHAGOGASTRODUODENOSCOPY (EGD) WITH PROPOFOL;  Surgeon: Jonathon Bellows, MD;  Location: [facility] ENDOSCOPY;  Service: Endoscopy;  Laterality: N/A;  . IR GENERIC HISTORICAL  11/03/2016   IR FLUORO GUIDE PORT INSERTION RIGHT 11/03/2016 Aletta Edouard, MD [facility]-INTERV RAD  . IR REMOVAL TUN ACCESS W/ PORT W/O FL MOD SED  02/15/2017  . REFRACTIVE SURGERY  09/2015    Prior to Admission medications   Medication Sig Start Date End Date Taking? Authorizing Provider  escitalopram (LEXAPRO) 5 MG tablet Take 1 tablet (5 mg total) by mouth daily. 03/30/19   Cannady, Henrine Screws T, NP  gabapentin (NEURONTIN) 100 MG capsule Take 1 capsule (100 mg total) by mouth at bedtime. 08/15/19   Cannady, Henrine Screws T, NP  levothyroxine (SYNTHROID, LEVOTHROID) 50 MCG tablet Take 1 tablet (50 mcg total) by mouth daily. 12/27/18   Cannady, Jolene T, NP  ondansetron (ZOFRAN ODT) 4 MG disintegrating tablet Take 1 tablet (4 mg total) by mouth every 8 (eight) hours as needed. 09/22/19   Gregor Hams, MD  oxyCODONE-acetaminophen (PERCOCET) 5-325 MG tablet Take 1 tablet by mouth every 4 (four) hours as needed. 09/22/19 09/21/20  Gregor Hams, MD  YUVAFEM 10 MCG TABS vaginal tablet 1 (ONE) TABLET TWICE WEEKLY 03/08/19   [provider]    Allergies Patient has no known allergies.  Family History  Problem Relation Age of Onset  . Cancer Mother        breast  . Cancer Father        lung  . Heart disease Brother 64       MI  . Heart disease Paternal Grandfather        MI    Social History Social History   Tobacco Use  . Smoking status: Never Smoker  . Smokeless tobacco:  Never Used  Substance Use Topics  . Alcohol use: No  . Drug use: No    Review of Systems Constitutional: No fever/chills Eyes: No visual changes. ENT: No sore throat. Cardiovascular: Denies chest pain. Respiratory: Denies shortness of breath. Gastrointestinal: Right upper quadrant pain with palpation.  No diarrhea.  No constipation. Genitourinary: Negative for dysuria. Musculoskeletal: Negative for neck pain.  Negative for back pain. Integumentary: Negative for rash. Neurological: Negative for headaches, focal weakness or numbness.    ____________________________________________   PHYSICAL EXAM:  VITAL SIGNS: ED Triage Vitals  Enc Vitals Group     BP 09/22/19 1840 124/83     Pulse Rate 09/22/19 1838 90     Resp 09/22/19 1838 16     Temp 09/22/19 1838 98.2 F (36.8 C)     Temp Source 09/22/19 1838 Oral     SpO2 09/22/19 1838 97 %     Weight 09/22/19 1836 82.6 kg (182 lb)     Height 09/22/19 1836 1.676 m (5\' 6" )     Head Circumference --      Peak Flow --      Pain Score 09/22/19 1836 8     Pain Loc --      Pain Edu? --      Excl. in [place]? --     Constitutional: Alert and oriented.  Eyes: Conjunctivae are normal.  Mouth/Throat: Patient is wearing a mask. Neck: No stridor.  No meningeal signs.   Cardiovascular: Normal rate, regular rhythm. Good peripheral circulation. Grossly normal heart sounds. Respiratory: Normal respiratory effort.  No retractions. Gastrointestinal: Soft and nontender. No distention.  Musculoskeletal: No lower extremity tenderness nor edema. No gross deformities of extremities. Neurologic:  Normal speech and language. No gross focal neurologic deficits are appreciated.  Skin:  Skin is warm, dry and intact. Psychiatric: Mood and affect are normal. Speech and behavior are normal.  ____________________________________________   LABS (all labs ordered are listed, but only abnormal results are displayed)  Labs Reviewed  URINALYSIS, COMPLETE (UACMP) WITH MICROSCOPIC - Abnormal; Notable for the following components:      Result Value   Color, Urine YELLOW (*)    APPearance CLEAR (*)    All other components within normal limits  BASIC METABOLIC PANEL - Abnormal; Notable for the following components:   Glucose, Bld 115 (*)    Creatinine, Ser 1.03 (*)    All other components within normal limits  CBC   ____________________________________________  EKG   RADIOLOGY I, Ventana N Kaila Devries, personally viewed and evaluated these images (plain radiographs) as part of my medical decision  making, as well as reviewing the written report by the radiologist.  ED MD interpretation: Right upper quadrant ultrasound revealed cholelithiasis without sonographic evidence of cholecystitis.  Official radiology report(s): No results found.  ___________________________________________  Procedures   ____________________________________________   INITIAL IMPRESSION / MDM / ASSESSMENT AND PLAN / ED COURSE  As part of my medical decision making, I reviewed the following data within the electronic MEDICAL RECORD NUMBER   59 year old female presented with above-stated history and physical exam a differential diagnosis including but not limited to cholelithiasis, cholecystitis less likely appendicitis.  Ultrasound consistent with cholelithiasis without evidence of cholecystitis.  Patient given IV morphine with improvement of pain  ____________________________________________  FINAL CLINICAL IMPRESSION(S) / ED DIAGNOSES  Final diagnoses:  RUQ pain  Calculus of gallbladder without cholecystitis without obstruction     MEDICATIONS GIVEN DURING THIS VISIT:  Medications  morphine 2 MG/ML injection 2  mg (2 mg Intravenous Given 09/22/19 2217)  ondansetron (ZOFRAN) injection 4 mg (4 mg Intravenous Given 09/22/19 2217)  morphine 4 MG/ML injection 4 mg (4 mg Intravenous Given 09/22/19 2258)  iohexol (OMNIPAQUE) 300 MG/ML solution 100 mL (100 mLs Intravenous Contrast Given 09/22/19 2316)     ED Discharge Orders         Ordered    oxyCODONE-acetaminophen (PERCOCET) 5-325 MG tablet  Every 4 hours PRN     09/22/19 2257    ondansetron (ZOFRAN ODT) 4 MG disintegrating tablet  Every 8 hours PRN     09/22/19 2257          *Please note:  MARISKA STENNETT was evaluated in Emergency Department on 09/24/2019 for the symptoms described in the history of present illness. She was evaluated in the context of the global COVID-19 pandemic, which necessitated consideration that the patient might be at risk for  infection with the SARS-CoV-2 virus that causes COVID-19. Institutional protocols and algorithms that pertain to the evaluation of patients at risk for COVID-19 are in a state of rapid change based on information released by regulatory bodies including the CDC and federal and state organizations. These policies and algorithms were followed during the patient's care in the ED.  Some ED evaluations and interventions may be delayed as a result of limited staffing during the pandemic.*  Note:  This document was prepared using Dragon voice recognition software and may include unintentional dictation errors.   Gregor Hams, MD 09/24/19 1233

## 2019-09-22 NOTE — ED Triage Notes (Signed)
Pt to ED via POV c/o right flank pain. Pt states that she has had pain x 1 week. Pain radiates around into her back. Pt states that she has had nausea with no vomiting. Pt is in NAD at this time.

## 2019-09-23 NOTE — ED Provider Notes (Signed)
  Physical Exam  BP 124/83   Pulse 90   Temp 98.2 F (36.8 C) (Oral)   Resp 16   Ht 5\' 6"  (1.676 m)   Wt 82.6 kg   LMP  (LMP Unknown)   SpO2 97%   BMI 29.38 kg/m   Physical Exam  ED Course/Procedures     Procedures  MDM   Assumed care for Dr. Owens Shark.  CT abdomen and pelvis revealed no evidence of appendicitis.  Patient was discharged home with follow-up with general surgery to address cholelithiasis.  Return precautions were given to return with new or worsening symptoms.  All patient questions were answered.       Vallarie Mare Venedy, PA-C 09/23/19 0014    Carrie Mew, MD 09/27/19 [phone]

## 2019-09-23 NOTE — ED Notes (Signed)
Patient transferred from stretcher to wheelchair independently and with steady gait.

## 2019-09-26 ENCOUNTER — Ambulatory Visit (INDEPENDENT_AMBULATORY_CARE_PROVIDER_SITE_OTHER): Payer: 59 | Admitting: Surgery

## 2019-09-26 ENCOUNTER — Other Ambulatory Visit: Payer: Self-pay

## 2019-09-26 ENCOUNTER — Encounter: Payer: Self-pay | Admitting: Surgery

## 2019-09-26 VITALS — BP 116/82 | HR 82 | Temp 97.7°F | Ht 66.0 in | Wt 186.0 lb

## 2019-09-26 DIAGNOSIS — K8020 Calculus of gallbladder without cholecystitis without obstruction: Secondary | ICD-10-CM | POA: Diagnosis not present

## 2019-09-26 NOTE — H&P (View-Only) (Signed)
09/26/2019  Reason for Visit:  Symptomatic cholelithiasis  History of Present Illness: Christina [name] is a 59 y.o. female presenting for evaluation of symptomatic cholelithiasis.  She was seen in the ED on 1/2 with a 1-week history of RUQ and back pain, associated with nausea but no emesis.  She reports the symptoms initially started on 12/26 and had nausea and diarrhea for 1-2 days, associated with RUQ pain and back pain.  The diarrhea resolved, but her other symptoms continued.  She reports that overall there is a constant low grade amount of tenderness/soreness, and it spikes after eating, and also particularly at night.  She has tried a low fat diet, but this is not helping as she had pain last night just with eating some chips.  She denies any fevers, chills, chest pain, shortness of breath.  She does have some issues with chronic constipation, and her last BM was about 4-5 days ago.    She presented to the ED on 1/2 because she could not wait until her appointment with her PCP due to the symptoms.  Earlier that night, she and her husband had gone to [employer] and she felt ill after only a couple of bites of pasta.  In the ED, she had overall normal labs with normal WBC, but LFTs were not obtained.  She had a CT scan as well as an U/S which showed cholelithiasis without any gallbladder wall thickening or surrounding fluid.  She did have moderate stool burden in her colon.  Of note, she has a history of B-cell lymphoma, s/p chemo and radiation in 2018.  She has been doing well from that standpoint.  Past Medical History: Past Medical History:  Diagnosis Date  . B-cell lymphoma ([place])   . Cancer ([place]) 2018   lymphoma, non hodgekins B cell  . Chest pain 12/08/2016  . Chest pain 12/08/2016  . Dehydration 12/08/2016  . GERD (gastroesophageal reflux disease)      Past Surgical History: Past Surgical History:  Procedure Laterality Date  . ABDOMINAL HYSTERECTOMY    . [place]   x 2   . COLONOSCOPY WITH PROPOFOL N/A 09/28/2017   Procedure: COLONOSCOPY WITH PROPOFOL;  Surgeon: Jonathon Bellows, MD;  Location: [facility] ENDOSCOPY;  Service: Gastroenterology;  Laterality: N/A;  . ESOPHAGOGASTRODUODENOSCOPY (EGD) WITH PROPOFOL N/A 11/11/2016   Procedure: ESOPHAGOGASTRODUODENOSCOPY (EGD) WITH PROPOFOL;  Surgeon: Jonathon Bellows, MD;  Location: [facility] ENDOSCOPY;  Service: Endoscopy;  Laterality: N/A;  . IR GENERIC HISTORICAL  11/03/2016   IR FLUORO GUIDE PORT INSERTION RIGHT 11/03/2016 Aletta Edouard, MD [facility]-INTERV RAD  . IR REMOVAL TUN ACCESS W/ PORT W/O FL MOD SED  02/15/2017  . REFRACTIVE SURGERY  09/2015    Home Medications: Prior to Admission medications   Medication Sig Start Date End Date Taking? Authorizing Provider  escitalopram (LEXAPRO) 5 MG tablet Take 1 tablet (5 mg total) by mouth daily. 03/30/19  Yes Cannady, Jolene T, NP  gabapentin (NEURONTIN) 100 MG capsule Take 1 capsule (100 mg total) by mouth at bedtime. 08/15/19  Yes Cannady, Jolene T, NP  levothyroxine (SYNTHROID, LEVOTHROID) 50 MCG tablet Take 1 tablet (50 mcg total) by mouth daily. 12/27/18  Yes Cannady, Jolene T, NP  ondansetron (ZOFRAN ODT) 4 MG disintegrating tablet Take 1 tablet (4 mg total) by mouth every 8 (eight) hours as needed. 09/22/19  Yes Gregor Hams, MD  YUVAFEM 10 MCG TABS vaginal tablet 1 (ONE) TABLET TWICE WEEKLY 03/08/19  Yes [provider]  oxyCODONE-acetaminophen (PERCOCET) 5-325 MG tablet Take 1 tablet by mouth every 4 (four) hours as needed. Patient not taking: Reported on 09/26/2019 09/22/19 09/21/20  Gregor Hams, MD    Allergies: No Known Allergies  Social History:  reports that she has never smoked. She has never used smokeless tobacco. She reports that she does not drink alcohol or use drugs.   Family History: Family History  Problem Relation Age of Onset  . Cancer Mother        breast  . Cancer Father        lung  . Heart disease Brother 68       MI  . Heart  disease Paternal Grandfather        MI    Review of Systems: Review of Systems  Constitutional: Negative for chills and fever.  HENT: Negative for hearing loss.   Respiratory: Negative for shortness of breath.   Cardiovascular: Negative for chest pain.  Gastrointestinal: Positive for abdominal pain, constipation and nausea. Negative for diarrhea and vomiting.  Genitourinary: Negative for dysuria.  Musculoskeletal: Positive for back pain.  Skin: Negative for rash.  Neurological: Negative for dizziness.  Psychiatric/Behavioral: Negative for depression.    Physical Exam BP 116/82   Pulse 82   Temp 97.7 F (36.5 C)   Ht 5\' 6"  (1.676 m)   Wt 84.4 kg   LMP  (LMP Unknown)   SpO2 96%   BMI 30.02 kg/m  CONSTITUTIONAL: No acute distress HEENT:  Normocephalic, atraumatic, extraocular motion intact. NECK: Trachea is midline, and there is no jugular venous distension.  RESPIRATORY:  Lungs are clear, and breath sounds are equal bilaterally. Normal respiratory effort without pathologic use of accessory muscles. CARDIOVASCULAR: Heart is regular without murmurs, gallops, or rubs. GI: The abdomen is soft, non-distended, with some tenderness to palpation in the right upper quadrant extending towards the right flank.  Negative Murphy's sign at this point. There were no palpable masses. She may have a very small umbilical hernia. MUSCULOSKELETAL:  Normal muscle strength and tone in all four extremities.  No peripheral edema or cyanosis. SKIN: Skin turgor is normal. There are no pathologic skin lesions.  NEUROLOGIC:  Motor and sensation is grossly normal.  Cranial nerves are grossly intact. PSYCH:  Alert and oriented to person, place and time. Affect is normal.  Laboratory Analysis: Labs 09/22/19: Na 142, K 3.9, Cl 109, CO2 23, BUN 16, Cr 1.03.  WBC 9.3, Hgb 13.2, Hct 38.4, Plt 277.  Imaging: CT scan abd/pelvis 09/22/19: IMPRESSION: 1. No acute abnormality in the abdomen/pelvis. 2. Mild hepatic  steatosis. 3. Moderate colonic stool burden with mild sigmoid tortuosity, query constipation.  U/S 09/22/19: IMPRESSION: 1. Cholelithiasis without sonographic evidence for acute cholecystitis or biliary dilatation 2. Echogenic liver consistent with steatosis and or hepatocellular Disease  Assessment and Plan: This is a 59 y.o. female with symptomatic cholelithiasis.  Discussed with her that her symptoms of RUQ pain, nausea, are very common to gallbladder pathology.  Despite the moderate stool burden, this is not as a result of constipation.  Given her persistent low grade symptoms at this point, I do not think that a trial of low fat diet would really be of much help.  I discussed with her that we can proceed with a laparoscopic cholecystectomy.  Discussed the surgery at length, the risks of bleeding, infection, and injury to surrounding structures.  Discussed that she would need to get tested for COVID prior to surgery, as well as the  post-operative recovery and activity restrictions.  She's willing to proceed.  We will schedule her for 10/04/19 in the morning.  As a precaution, will send clearance forms to her Oncologist and PCP.    Face-to-face time spent with the patient and care providers was 45 minutes, with more than 50% of the time spent counseling, educating, and coordinating care of the patient.     Melvyn Neth, [facility]

## 2019-09-26 NOTE — Progress Notes (Signed)
09/26/2019  Reason for Visit:  Symptomatic cholelithiasis  History of Present Illness: [name] is a 59 y.o. female presenting for evaluation of symptomatic cholelithiasis.  She was seen in the ED on 1/2 with a 1-week history of RUQ and back pain, associated with nausea but no emesis.  She reports the symptoms initially started on 12/26 and had nausea and diarrhea for 1-2 days, associated with RUQ pain and back pain.  The diarrhea resolved, but her other symptoms continued.  She reports that overall there is a constant low grade amount of tenderness/soreness, and it spikes after eating, and also particularly at night.  She has tried a low fat diet, but this is not helping as she had pain last night just with eating some chips.  She denies any fevers, chills, chest pain, shortness of breath.  She does have some issues with chronic constipation, and her last BM was about 4-5 days ago.    She presented to the ED on 1/2 because she could not wait until her appointment with her PCP due to the symptoms.  Earlier that night, she and her husband had gone to [place] [employer] and she felt ill after only a couple of bites of pasta.  In the ED, she had overall normal labs with normal WBC, but LFTs were not obtained.  She had a CT scan as well as an U/S which showed cholelithiasis without any gallbladder wall thickening or surrounding fluid.  She did have moderate stool burden in her colon.  Of note, she has a history of B-cell lymphoma, s/p chemo and radiation in 2018.  She has been doing well from that standpoint.  Past Medical History: Past Medical History:  Diagnosis Date  . B-cell lymphoma ([place])   . Cancer ([place]) 2018   lymphoma, non hodgekins B cell  . Chest pain 12/08/2016  . Chest pain 12/08/2016  . Dehydration 12/08/2016  . GERD (gastroesophageal reflux disease)      Past Surgical History: Past Surgical History:  Procedure Laterality Date  . ABDOMINAL HYSTERECTOMY    . [place]   x 2   . COLONOSCOPY WITH PROPOFOL N/A 09/28/2017   Procedure: COLONOSCOPY WITH PROPOFOL;  Surgeon: Jonathon Bellows, MD;  Location[facility] ENDOSCOPY;  Service: Gastroenterology;  Laterality: N/A;  . ESOPHAGOGASTRODUODENOSCOPY (EGD) WITH PROPOFOL N/A 11/11/2016   Procedure: ESOPHAGOGASTRODUODENOSCOPY (EGD) WITH PROPOFOL;  Surgeon: Jonathon Bellows, MD;  Location: [facility] ENDOSCOPY;  Service: Endoscopy;  Laterality: N/A;  . IR GENERIC HISTORICAL  11/03/2016   IR FLUORO GUIDE PORT INSERTION RIGHT 11/03/2016 Aletta Edouard, MD [facility]-INTERV RAD  . IR REMOVAL TUN ACCESS W/ PORT W/O FL MOD SED  02/15/2017  . REFRACTIVE SURGERY  09/2015    Home Medications: Prior to Admission medications   Medication Sig Start Date End Date Taking? Authorizing Provider  escitalopram (LEXAPRO) 5 MG tablet Take 1 tablet (5 mg total) by mouth daily. 03/30/19  Yes Cannady, Jolene T, NP  gabapentin (NEURONTIN) 100 MG capsule Take 1 capsule (100 mg total) by mouth at bedtime. 08/15/19  Yes Cannady, Jolene T, NP  levothyroxine (SYNTHROID, LEVOTHROID) 50 MCG tablet Take 1 tablet (50 mcg total) by mouth daily. 12/27/18  Yes Cannady, Jolene T, NP  ondansetron (ZOFRAN ODT) 4 MG disintegrating tablet Take 1 tablet (4 mg total) by mouth every 8 (eight) hours as needed. 09/22/19  Yes Gregor Hams, MD  YUVAFEM 10 MCG TABS vaginal tablet 1 (ONE) TABLET TWICE WEEKLY 03/08/19  Yes [provider]  oxyCODONE-acetaminophen (PERCOCET) 5-325 MG tablet Take 1 tablet by mouth every 4 (four) hours as needed. Patient not taking: Reported on 09/26/2019 09/22/19 09/21/20  Gregor Hams, MD    Allergies: No Known Allergies  Social History:  reports that she has never smoked. She has never used smokeless tobacco. She reports that she does not drink alcohol or use drugs.   Family History: Family History  Problem Relation Age of Onset  . Cancer Mother        breast  . Cancer Father        lung  . Heart disease Brother 41       MI  . Heart  disease Paternal Grandfather        MI    Review of Systems: Review of Systems  Constitutional: Negative for chills and fever.  HENT: Negative for hearing loss.   Respiratory: Negative for shortness of breath.   Cardiovascular: Negative for chest pain.  Gastrointestinal: Positive for abdominal pain, constipation and nausea. Negative for diarrhea and vomiting.  Genitourinary: Negative for dysuria.  Musculoskeletal: Positive for back pain.  Skin: Negative for rash.  Neurological: Negative for dizziness.  Psychiatric/Behavioral: Negative for depression.    Physical Exam BP 116/82   Pulse 82   Temp 97.7 F (36.5 C)   Ht 5\' 6"  (1.676 m)   Wt 84.4 kg   LMP  (LMP Unknown)   SpO2 96%   BMI 30.02 kg/m  CONSTITUTIONAL: No acute distress HEENT:  Normocephalic, atraumatic, extraocular motion intact. NECK: Trachea is midline, and there is no jugular venous distension.  RESPIRATORY:  Lungs are clear, and breath sounds are equal bilaterally. Normal respiratory effort without pathologic use of accessory muscles. CARDIOVASCULAR: Heart is regular without murmurs, gallops, or rubs. GI: The abdomen is soft, non-distended, with some tenderness to palpation in the right upper quadrant extending towards the right flank.  Negative Murphy's sign at this point. There were no palpable masses. She may have a very small umbilical hernia. MUSCULOSKELETAL:  Normal muscle strength and tone in all four extremities.  No peripheral edema or cyanosis. SKIN: Skin turgor is normal. There are no pathologic skin lesions.  NEUROLOGIC:  Motor and sensation is grossly normal.  Cranial nerves are grossly intact. PSYCH:  Alert and oriented to person, place and time. Affect is normal.  Laboratory Analysis: Labs 09/22/19: Na 142, K 3.9, Cl 109, CO2 23, BUN 16, Cr 1.03.  WBC 9.3, Hgb 13.2, Hct 38.4, Plt 277.  Imaging: CT scan abd/pelvis 09/22/19: IMPRESSION: 1. No acute abnormality in the abdomen/pelvis. 2. Mild hepatic  steatosis. 3. Moderate colonic stool burden with mild sigmoid tortuosity, query constipation.  U/S 09/22/19: IMPRESSION: 1. Cholelithiasis without sonographic evidence for acute cholecystitis or biliary dilatation 2. Echogenic liver consistent with steatosis and or hepatocellular Disease  Assessment and Plan: This is a 59 y.o. female with symptomatic cholelithiasis.  Discussed with her that her symptoms of RUQ pain, nausea, are very common to gallbladder pathology.  Despite the moderate stool burden, this is not as a result of constipation.  Given her persistent low grade symptoms at this point, I do not think that a trial of low fat diet would really be of much help.  I discussed with her that we can proceed with a laparoscopic cholecystectomy.  Discussed the surgery at length, the risks of bleeding, infection, and injury to surrounding structures.  Discussed that she would need to get tested for COVID prior to surgery, as well as the  post-operative recovery and activity restrictions.  She's willing to proceed.  We will schedule her for 10/04/19 in the morning.  As a precaution, will send clearance forms to her Oncologist and PCP.    Face-to-face time spent with the patient and care providers was 45 minutes, with more than 50% of the time spent counseling, educating, and coordinating care of the patient.     Melvyn Neth, [facility]

## 2019-09-26 NOTE — Patient Instructions (Addendum)
You have requested to have your Gallbladder removed. We will arrange this to be done on 10/04/19 at [name] with Dr Hampton Abbot.   You will be off from work for approximately 1-2 weeks depending on your recovery.   Please avoid greasy and fried foods if at all possible prior to your scheduled surgery to decrease symptoms until then.  Please see the [facility]) pre-care form you have been given today.  If you have any questions or concerns please call our office.   You will need to refrain from heavy lifting, no more than 10-15 pounds, for 4 weeks following surgery.     Laparoscopic Cholecystectomy Laparoscopic cholecystectomy is surgery to remove the gallbladder. The gallbladder is a pear-shaped organ that lies beneath the liver on the right side of the body. The gallbladder stores bile, which is a fluid that helps the body to digest fats. Cholecystectomy is often done for inflammation of the gallbladder (cholecystitis). This condition is usually caused by a buildup of gallstones (cholelithiasis) in the gallbladder. Gallstones can block the flow of bile, which can result in inflammation and pain. In severe cases, emergency surgery may be required. This procedure is done though small incisions in your abdomen (laparoscopic surgery). A thin scope with a camera (laparoscope) is inserted through one incision. Thin surgical instruments are inserted through the other incisions. In some cases, a laparoscopic procedure may be turned into a type of surgery that is done through a larger incision (open surgery). Tell a health care provider about:  Any allergies you have.  All medicines you are taking, including vitamins, herbs, eye drops, creams, and over-the-counter medicines.  Any problems you or family members have had with anesthetic medicines.  Any blood disorders you have.  Any surgeries you have had.  Any medical conditions you have.  Whether you are pregnant or may be pregnant. What are  the risks? Generally, this is a safe procedure. However, problems may occur, including:  Infection.  Bleeding.  Allergic reactions to medicines.  Damage to other structures or organs.  A stone remaining in the common bile duct. The common bile duct carries bile from the gallbladder into the small intestine.  A bile leak from the cyst duct that is clipped when your gallbladder is removed. What happens before the procedure? Staying hydrated Follow instructions from your health care provider about hydration, which may include:  Up to 2 hours before the procedure - you may continue to drink clear liquids, such as water, clear fruit juice, black coffee, and plain tea. Eating and drinking restrictions Follow instructions from your health care provider about eating and drinking, which may include:  8 hours before the procedure - stop eating heavy meals or foods such as meat, fried foods, or fatty foods.  6 hours before the procedure - stop eating light meals or foods, such as toast or cereal.  6 hours before the procedure - stop drinking milk or drinks that contain milk.  2 hours before the procedure - stop drinking clear liquids. Medicines  Ask your health care provider about: ? Changing or stopping your regular medicines. This is especially important if you are taking diabetes medicines or blood thinners. ? Taking medicines such as aspirin and ibuprofen. These medicines can thin your blood. Do not take these medicines before your procedure if your health care provider instructs you not to.  You may be given antibiotic medicine to help prevent infection. General instructions  Let your health care provider know if you develop  a cold or an infection before surgery.  Plan to have someone take you home from the [name] or clinic.  Ask your health care provider how your surgical site will be marked or identified. What happens during the procedure?   To reduce your risk of  infection: ? Your health care team will wash or sanitize their hands. ? Your skin will be washed with soap. ? Hair may be removed from the surgical area.  An IV tube may be inserted into one of your veins.  You will be given one or more of the following: ? A medicine to help you relax (sedative). ? A medicine to make you fall asleep (general anesthetic).  A breathing tube will be placed in your mouth.  Your surgeon will make several small cuts (incisions) in your abdomen.  The laparoscope will be inserted through one of the small incisions. The camera on the laparoscope will send images to a TV screen (monitor) in the operating room. This lets your surgeon see inside your abdomen.  Air-like gas will be pumped into your abdomen. This will expand your abdomen to give the surgeon more room to perform the surgery.  Other tools that are needed for the procedure will be inserted through the other incisions. The gallbladder will be removed through one of the incisions.  Your common bile duct may be examined. If stones are found in the common bile duct, they may be removed.  After your gallbladder has been removed, the incisions will be closed with stitches (sutures), staples, or skin glue.  Your incisions may be covered with a bandage (dressing). The procedure may vary among health care providers and hospitals. What happens after the procedure?  Your blood pressure, heart rate, breathing rate, and blood oxygen level will be monitored until the medicines you were given have worn off.  You will be given medicines as needed to control your pain.  Do not drive for 24 hours if you were given a sedative. This information is not intended to replace advice given to you by your health care provider. Make sure you discuss any questions you have with your health care provider. Document Revised: 08/19/2017 Document Reviewed: 02/23/2016 Elsevier Patient Education  [place].

## 2019-09-26 NOTE — Progress Notes (Signed)
Request for Oncology clearance for surgery scheduled for 10/04/19 has been faxed to Dr Elroy Channel office. [place]: [id number]

## 2019-09-27 ENCOUNTER — Telehealth: Payer: Self-pay | Admitting: *Deleted

## 2019-09-27 NOTE — Telephone Encounter (Signed)
-----   Message from Clancy Gourd sent at 09/27/2019  7:42 AM EST ----- Regarding: [place],  I just uploaded a [place] form to be completed into this pt's chart in Epic under the Media tab. Thanks.

## 2019-09-28 ENCOUNTER — Telehealth: Payer: Self-pay

## 2019-09-28 ENCOUNTER — Telehealth: Payer: Self-pay | Admitting: Surgery

## 2019-09-28 NOTE — Telephone Encounter (Signed)
We received clearance from Oncology from [place] office. This patient is optimized for surgery at [facility].

## 2019-09-28 NOTE — Telephone Encounter (Signed)
Outbound call made to patient in an attempt to review & discuss upcoming sx details (date, pre-op/COVID testing date/times); patient requested a call back on Monday (10/01/19) to discuss further.

## 2019-10-01 ENCOUNTER — Telehealth: Payer: Self-pay | Admitting: Surgery

## 2019-10-01 NOTE — Telephone Encounter (Signed)
Pt has been advised of pre admission date/time, Covid Testing date and Surgery date.  Surgery Date: 10/04/19 Preadmission Testing Date: 10/02/19 Covid Testing Date: 10/02/19 - patient advised to go to the [facility]  [employer] Video sent via [employer] [facility] and [employer].  Patient has been made aware to call [phone], between 1-3:00pm the day before surgery, to find out what time to arrive.

## 2019-10-02 ENCOUNTER — Other Ambulatory Visit: Payer: Self-pay

## 2019-10-02 ENCOUNTER — Encounter
Admission: RE | Admit: 2019-10-02 | Discharge: 2019-10-02 | Disposition: A | Discharge status: Home / Self Care | Payer: 59 | Source: Ambulatory Visit | Attending: Surgery | Admitting: Surgery

## 2019-10-02 DIAGNOSIS — Z01818 Encounter for other preprocedural examination: Secondary | ICD-10-CM | POA: Insufficient documentation

## 2019-10-02 HISTORY — DX: Hypothyroidism, unspecified: E03.9

## 2019-10-02 NOTE — Patient Instructions (Signed)
Your procedure is scheduled QA:[id number] 1/14 Report to Day Surgery. [facility] To find out your arrival time please call [phone] between [place] on Wed. 1/13.  Remember: Instructions that are not followed completely may result in serious medical risk,  up to and including death, or upon the discretion of your surgeon and anesthesiologist your  surgery may need to be rescheduled.     _X__ 1. Do not eat food after midnight the night before your procedure.                 No gum chewing or hard candies. You may drink clear liquids up to 2 hours                 before you are scheduled to arrive for your surgery- DO not drink clear                 liquids within 2 hours of the start of your surgery.                 Clear Liquids include:  water, apple juice without pulp, clear carbohydrate                 drink such as Clearfast of Gatorade, Black Coffee or Tea (Do not add                 anything to coffee or tea).  __X__2.  On the morning of surgery brush your teeth with toothpaste and water, you                may rinse your mouth with mouthwash if you wish.  Do not swallow any toothpaste of mouthwash.     ___ 3.  No Alcohol for 24 hours before or after surgery.   ___ 4.  Do Not Smoke or use e-cigarettes For 24 Hours Prior to Your Surgery.                 Do not use any chewable tobacco products for at least 6 hours prior to                 surgery.  ____  5.  Bring all medications with you on the day of surgery if instructed.   _x__  6.  Notify your doctor if there is any change in your medical condition      (cold, fever, infections).     Do not wear jewelry, make-up, hairpins, clips or nail polish. Do not wear lotions, powders, or perfumes. You may wear deodorant. Do not shave 48 hours prior to surgery. Men may shave face and neck. Do not bring valuables to the hospital.    [facility] is not responsible for any belongings or valuables.  Contacts,  dentures or bridgework may not be worn into surgery. Leave your suitcase in the car. After surgery it may be brought to your room. For patients admitted to the hospital, discharge time is determined by your treatment team.   Patients discharged the day of surgery will not be allowed to drive home.   Please read over the following fact sheets that you were given:     _x___ Take these medicines the morning of surgery with A SIP OF WATER:    1. levothyroxine (SYNTHROID, LEVOTHROID) 50 MCG tablet  2.   3.   4.  5.  6.  ____ Fleet Enema (as directed)   __x__ Use CHG Soap as directed  ____ Use inhalers on the day of surgery  ____ Stop metformin 2 days prior to surgery    ____ Take 1/2 of usual insulin dose the night before surgery. No insulin the morning          of surgery.   ____ Stop Coumadin/Plavix/aspirin on   _x___ Stop Anti-inflammatories No ibuprofen aleve or aspirin products until after surgery      May take tylenol   ____ Stop supplements until after surgery.    ____ Bring C-Pap to the hospital.

## 2019-10-03 ENCOUNTER — Ambulatory Visit: Payer: 59 | Admitting: Nurse Practitioner

## 2019-10-03 ENCOUNTER — Other Ambulatory Visit
Admission: RE | Admit: 2019-10-03 | Discharge: 2019-10-03 | Disposition: A | Discharge status: Home / Self Care | Payer: 59 | Source: Ambulatory Visit | Attending: Surgery | Admitting: Surgery

## 2019-10-03 DIAGNOSIS — Z20822 Contact with and (suspected) exposure to covid-19: Secondary | ICD-10-CM | POA: Diagnosis not present

## 2019-10-03 DIAGNOSIS — Z01812 Encounter for preprocedural laboratory examination: Secondary | ICD-10-CM | POA: Diagnosis present

## 2019-10-03 LAB — SARS CORONAVIRUS 2 (TAT 6-24 HRS): SARS Coronavirus 2: NEGATIVE

## 2019-10-03 LAB — COMPREHENSIVE METABOLIC PANEL
ALT: 19 U/L (ref 0–44)
AST: 22 U/L (ref 15–41)
Albumin: 4 g/dL (ref 3.5–5.0)
Alkaline Phosphatase: 106 U/L (ref 38–126)
Anion gap: 8 (ref 5–15)
BUN: 13 mg/dL (ref 6–20)
CO2: 27 mmol/L (ref 22–32)
Calcium: 9.3 mg/dL (ref 8.9–10.3)
Chloride: 107 mmol/L (ref 98–111)
Creatinine, Ser: 1.01 mg/dL — ABNORMAL HIGH (ref 0.44–1.00)
GFR calc Af Amer: >60 mL/min (ref >60)
GFR calc non Af Amer: >60 mL/min (ref >60)
Glucose, Bld: 125 mg/dL — ABNORMAL HIGH (ref 70–99)
Potassium: 4.4 mmol/L (ref 3.5–5.1)
Sodium: 142 mmol/L (ref 135–145)
Total Bilirubin: 0.7 mg/dL (ref 0.3–1.2)
Total Protein: 6.9 g/dL (ref 6.5–8.1)

## 2019-10-04 ENCOUNTER — Ambulatory Visit: Payer: 59 | Admitting: Certified Registered Nurse Anesthetist

## 2019-10-04 ENCOUNTER — Other Ambulatory Visit: Payer: Self-pay

## 2019-10-04 ENCOUNTER — Encounter
Admission: RE | Disposition: A | Discharge status: Home / Self Care | Payer: Self-pay | Source: Home / Self Care | Attending: Surgery

## 2019-10-04 ENCOUNTER — Ambulatory Visit
Admission: RE | Admit: 2019-10-04 | Discharge: 2019-10-04 | Disposition: A | Discharge status: Home / Self Care | Payer: 59 | Attending: Surgery | Admitting: Surgery

## 2019-10-04 ENCOUNTER — Encounter: Payer: Self-pay | Admitting: Surgery

## 2019-10-04 DIAGNOSIS — K429 Umbilical hernia without obstruction or gangrene: Secondary | ICD-10-CM | POA: Insufficient documentation

## 2019-10-04 DIAGNOSIS — K8020 Calculus of gallbladder without cholecystitis without obstruction: Secondary | ICD-10-CM

## 2019-10-04 DIAGNOSIS — K811 Chronic cholecystitis: Secondary | ICD-10-CM | POA: Diagnosis not present

## 2019-10-04 DIAGNOSIS — Z79899 Other long term (current) drug therapy: Secondary | ICD-10-CM | POA: Insufficient documentation

## 2019-10-04 DIAGNOSIS — Z9221 Personal history of antineoplastic chemotherapy: Secondary | ICD-10-CM | POA: Diagnosis not present

## 2019-10-04 DIAGNOSIS — Z79890 Hormone replacement therapy (postmenopausal): Secondary | ICD-10-CM | POA: Insufficient documentation

## 2019-10-04 DIAGNOSIS — Z8572 Personal history of non-Hodgkin lymphomas: Secondary | ICD-10-CM | POA: Diagnosis not present

## 2019-10-04 DIAGNOSIS — Z923 Personal history of irradiation: Secondary | ICD-10-CM | POA: Diagnosis not present

## 2019-10-04 HISTORY — PX: CHOLECYSTECTOMY: SHX55

## 2019-10-04 SURGERY — LAPAROSCOPIC CHOLECYSTECTOMY
Anesthesia: General | Site: Abdomen

## 2019-10-04 MED ORDER — PROPOFOL 500 MG/50ML IV EMUL
INTRAVENOUS | Status: AC
  Filled 2019-10-04: qty 50

## 2019-10-04 MED ORDER — FAMOTIDINE 20 MG PO TABS: 20.0000 mg | ORAL_TABLET | Freq: Once | ORAL | Status: AC

## 2019-10-04 MED ORDER — CHLORHEXIDINE GLUCONATE CLOTH 2 % EX PADS: 6.0000 | MEDICATED_PAD | Freq: Once | CUTANEOUS | Status: DC

## 2019-10-04 MED ORDER — OXYCODONE HCL 5 MG/5ML PO SOLN: 5.0000 mg | Freq: Once | ORAL | Status: AC | PRN

## 2019-10-04 MED ORDER — PHENYLEPHRINE HCL (PRESSORS) 10 MG/ML IV SOLN
INTRAVENOUS | Status: DC | PRN
  Administered 2019-10-04 (×2): 100 ug via INTRAVENOUS

## 2019-10-04 MED ORDER — IBUPROFEN 600 MG PO TABS: 600.0000 mg | ORAL_TABLET | Freq: Three times a day (TID) | ORAL | 0 refills | Status: DC | PRN

## 2019-10-04 MED ORDER — ROCURONIUM BROMIDE 50 MG/5ML IV SOLN
INTRAVENOUS | Status: AC
  Filled 2019-10-04: qty 1

## 2019-10-04 MED ORDER — LACTATED RINGERS IV SOLN: INTRAVENOUS | Status: DC

## 2019-10-04 MED ORDER — FENTANYL CITRATE (PF) 100 MCG/2ML IJ SOLN
INTRAMUSCULAR | Status: AC
  Filled 2019-10-04: qty 2

## 2019-10-04 MED ORDER — FENTANYL CITRATE (PF) 100 MCG/2ML IJ SOLN
25.0000 ug | INTRAMUSCULAR | Status: DC | PRN
  Administered 2019-10-04: 50 ug via INTRAVENOUS

## 2019-10-04 MED ORDER — OXYCODONE HCL 5 MG PO TABS: 5.0000 mg | ORAL_TABLET | ORAL | 0 refills | Status: DC | PRN

## 2019-10-04 MED ORDER — EPHEDRINE SULFATE 50 MG/ML IJ SOLN
INTRAMUSCULAR | Status: AC
  Filled 2019-10-04: qty 1

## 2019-10-04 MED ORDER — FAMOTIDINE 20 MG PO TABS
ORAL_TABLET | ORAL | Status: AC
  Administered 2019-10-04: 10:00:00 20 mg via ORAL
  Filled 2019-10-04: qty 1

## 2019-10-04 MED ORDER — CEFAZOLIN SODIUM-DEXTROSE 2-4 GM/100ML-% IV SOLN
2.0000 g | INTRAVENOUS | Status: AC
  Administered 2019-10-04: 10:00:00 2 g via INTRAVENOUS

## 2019-10-04 MED ORDER — ACETAMINOPHEN 500 MG PO TABS
ORAL_TABLET | ORAL | Status: AC
  Administered 2019-10-04: 10:00:00 1000 mg via ORAL
  Filled 2019-10-04: qty 2

## 2019-10-04 MED ORDER — GABAPENTIN 300 MG PO CAPS
ORAL_CAPSULE | ORAL | Status: AC
  Administered 2019-10-04: 300 mg via ORAL
  Filled 2019-10-04: qty 1

## 2019-10-04 MED ORDER — ROCURONIUM BROMIDE 100 MG/10ML IV SOLN
INTRAVENOUS | Status: DC | PRN
  Administered 2019-10-04: 50 mg via INTRAVENOUS

## 2019-10-04 MED ORDER — CEFAZOLIN SODIUM-DEXTROSE 2-4 GM/100ML-% IV SOLN
INTRAVENOUS | Status: AC
  Filled 2019-10-04: qty 100

## 2019-10-04 MED ORDER — OXYCODONE HCL 5 MG PO TABS
5.0000 mg | ORAL_TABLET | Freq: Once | ORAL | Status: AC | PRN
  Administered 2019-10-04: 14:00:00 5 mg via ORAL

## 2019-10-04 MED ORDER — ACETAMINOPHEN 500 MG PO TABS: 1000.0000 mg | ORAL_TABLET | ORAL | Status: AC

## 2019-10-04 MED ORDER — PHENYLEPHRINE HCL (PRESSORS) 10 MG/ML IV SOLN
INTRAVENOUS | Status: AC
  Filled 2019-10-04: qty 1

## 2019-10-04 MED ORDER — KETOROLAC TROMETHAMINE 30 MG/ML IJ SOLN
INTRAMUSCULAR | Status: DC | PRN
  Administered 2019-10-04: 30 mg via INTRAVENOUS

## 2019-10-04 MED ORDER — ONDANSETRON HCL 4 MG/2ML IJ SOLN
INTRAMUSCULAR | Status: DC | PRN
  Administered 2019-10-04: 4 mg via INTRAVENOUS

## 2019-10-04 MED ORDER — LABETALOL HCL 5 MG/ML IV SOLN
INTRAVENOUS | Status: DC | PRN
  Administered 2019-10-04: 5 mg via INTRAVENOUS

## 2019-10-04 MED ORDER — ONDANSETRON HCL 4 MG/2ML IJ SOLN
INTRAMUSCULAR | Status: AC
  Filled 2019-10-04: qty 2

## 2019-10-04 MED ORDER — MIDAZOLAM HCL 2 MG/2ML IJ SOLN
INTRAMUSCULAR | Status: AC
  Filled 2019-10-04: qty 2

## 2019-10-04 MED ORDER — OXYCODONE HCL 5 MG PO TABS
5.0000 mg | ORAL_TABLET | ORAL | Status: DC | PRN
  Filled 2019-10-04: qty 1

## 2019-10-04 MED ORDER — ONDANSETRON HCL 4 MG/2ML IJ SOLN: 4.0000 mg | Freq: Once | INTRAMUSCULAR | Status: DC | PRN

## 2019-10-04 MED ORDER — SUCCINYLCHOLINE CHLORIDE 20 MG/ML IJ SOLN
INTRAMUSCULAR | Status: AC
  Filled 2019-10-04: qty 1

## 2019-10-04 MED ORDER — FENTANYL CITRATE (PF) 100 MCG/2ML IJ SOLN
INTRAMUSCULAR | Status: AC
  Administered 2019-10-04: 12:00:00 50 ug via INTRAVENOUS
  Filled 2019-10-04: qty 2

## 2019-10-04 MED ORDER — SUGAMMADEX SODIUM 200 MG/2ML IV SOLN
INTRAVENOUS | Status: AC
  Filled 2019-10-04: qty 2

## 2019-10-04 MED ORDER — LIDOCAINE HCL (PF) 2 % IJ SOLN
INTRAMUSCULAR | Status: AC
  Filled 2019-10-04: qty 5

## 2019-10-04 MED ORDER — FENTANYL CITRATE (PF) 100 MCG/2ML IJ SOLN
INTRAMUSCULAR | Status: DC | PRN
  Administered 2019-10-04 (×2): 50 ug via INTRAVENOUS

## 2019-10-04 MED ORDER — LIDOCAINE HCL (CARDIAC) PF 100 MG/5ML IV SOSY
PREFILLED_SYRINGE | INTRAVENOUS | Status: DC | PRN
  Administered 2019-10-04: 100 mg via INTRAVENOUS

## 2019-10-04 MED ORDER — PROPOFOL 10 MG/ML IV BOLUS
INTRAVENOUS | Status: DC | PRN
  Administered 2019-10-04: 200 mg via INTRAVENOUS

## 2019-10-04 MED ORDER — HYDROMORPHONE HCL 1 MG/ML IJ SOLN
INTRAMUSCULAR | Status: AC
  Filled 2019-10-04: qty 1

## 2019-10-04 MED ORDER — FENTANYL CITRATE (PF) 100 MCG/2ML IJ SOLN
INTRAMUSCULAR | Status: AC
  Administered 2019-10-04: 50 ug via INTRAVENOUS
  Filled 2019-10-04: qty 2

## 2019-10-04 MED ORDER — HYDROMORPHONE HCL 1 MG/ML IJ SOLN
INTRAMUSCULAR | Status: AC
  Administered 2019-10-04: 13:00:00 0.5 mg via INTRAVENOUS
  Filled 2019-10-04: qty 1

## 2019-10-04 MED ORDER — 0.9 % SODIUM CHLORIDE (POUR BTL) OPTIME
TOPICAL | Status: DC | PRN
  Administered 2019-10-04: 500 mL

## 2019-10-04 MED ORDER — MIDAZOLAM HCL 2 MG/2ML IJ SOLN
INTRAMUSCULAR | Status: DC | PRN
  Administered 2019-10-04: 2 mg via INTRAVENOUS

## 2019-10-04 MED ORDER — DEXAMETHASONE SODIUM PHOSPHATE 10 MG/ML IJ SOLN
INTRAMUSCULAR | Status: AC
  Filled 2019-10-04: qty 1

## 2019-10-04 MED ORDER — KETOROLAC TROMETHAMINE 30 MG/ML IJ SOLN
INTRAMUSCULAR | Status: AC
  Filled 2019-10-04: qty 1

## 2019-10-04 MED ORDER — DEXAMETHASONE SODIUM PHOSPHATE 10 MG/ML IJ SOLN
INTRAMUSCULAR | Status: DC | PRN
  Administered 2019-10-04: 10 mg via INTRAVENOUS

## 2019-10-04 MED ORDER — SUGAMMADEX SODIUM 200 MG/2ML IV SOLN
INTRAVENOUS | Status: DC | PRN
  Administered 2019-10-04: 200 mg via INTRAVENOUS

## 2019-10-04 MED ORDER — HYDROMORPHONE HCL 1 MG/ML IJ SOLN
0.2500 mg | INTRAMUSCULAR | Status: DC | PRN
  Administered 2019-10-04 (×2): 0.5 mg via INTRAVENOUS

## 2019-10-04 MED ORDER — OXYCODONE HCL 5 MG PO TABS
ORAL_TABLET | ORAL | Status: AC
  Filled 2019-10-04: qty 1

## 2019-10-04 MED ORDER — GLYCOPYRROLATE 0.2 MG/ML IJ SOLN
INTRAMUSCULAR | Status: AC
  Filled 2019-10-04: qty 1

## 2019-10-04 MED ORDER — BUPIVACAINE-EPINEPHRINE (PF) 0.25% -1:200000 IJ SOLN
INTRAMUSCULAR | Status: DC | PRN
  Administered 2019-10-04: 30 mL

## 2019-10-04 MED ORDER — GABAPENTIN 300 MG PO CAPS: 300.0000 mg | ORAL_CAPSULE | ORAL | Status: AC

## 2019-10-04 SURGICAL SUPPLY — 46 items
APPLIER CLIP 5 13 M/L LIGAMAX5 (MISCELLANEOUS) ×3
BLADE SURG 15 STRL LF DISP TIS (BLADE) ×1 IMPLANT
BLADE SURG 15 STRL SS (BLADE) ×2
CANISTER SUCT 1200ML W/VALVE (MISCELLANEOUS) ×3 IMPLANT
CATH CHOLANGI 4FR 420404F (CATHETERS) IMPLANT
CHLORAPREP W/TINT 26 (MISCELLANEOUS) ×3 IMPLANT
CLIP APPLIE 5 13 M/L LIGAMAX5 (MISCELLANEOUS) ×1 IMPLANT
CONRAY 60ML FOR OR (MISCELLANEOUS) ×3 IMPLANT
COVER WAND RF STERILE (DRAPES) ×3 IMPLANT
DERMABOND ADVANCED (GAUZE/BANDAGES/DRESSINGS) ×2
DERMABOND ADVANCED .7 DNX12 (GAUZE/BANDAGES/DRESSINGS) ×1 IMPLANT
DRAPE C-ARM XRAY 36X54 (DRAPES) IMPLANT
ELECT CAUTERY BLADE TIP 2.5 (TIP) ×3
ELECT REM PT RETURN 9FT ADLT (ELECTROSURGICAL) ×3
ELECTRODE CAUTERY BLDE TIP 2.5 (TIP) ×1 IMPLANT
ELECTRODE REM PT RTRN 9FT ADLT (ELECTROSURGICAL) ×1 IMPLANT
GLOVE SURG SYN 7.0 (GLOVE) ×3 IMPLANT
GLOVE SURG SYN 7.5  E (GLOVE) ×2
GLOVE SURG SYN 7.5 E (GLOVE) ×1 IMPLANT
GOWN STRL REUS W/ TWL LRG LVL3 (GOWN DISPOSABLE) ×3 IMPLANT
GOWN STRL REUS W/TWL LRG LVL3 (GOWN DISPOSABLE) ×6
IRRIGATION STRYKERFLOW (MISCELLANEOUS) IMPLANT
IRRIGATOR STRYKERFLOW (MISCELLANEOUS)
IV CATH ANGIO 12GX3 LT BLUE (NEEDLE) ×3 IMPLANT
IV NS 1000ML (IV SOLUTION)
IV NS 1000ML BAXH (IV SOLUTION) IMPLANT
JACKSON PRATT 10 (INSTRUMENTS) IMPLANT
L-HOOK LAP DISP 36CM (ELECTROSURGICAL) ×3
LABEL OR SOLS (LABEL) ×3 IMPLANT
LHOOK LAP DISP 36CM (ELECTROSURGICAL) ×1 IMPLANT
NEEDLE HYPO 22GX1.5 SAFETY (NEEDLE) ×6 IMPLANT
PACK LAP CHOLECYSTECTOMY (MISCELLANEOUS) ×3 IMPLANT
PENCIL ELECTRO HAND CTR (MISCELLANEOUS) ×3 IMPLANT
POUCH SPECIMEN RETRIEVAL 10MM (ENDOMECHANICALS) ×3 IMPLANT
SCISSORS METZENBAUM CVD 33 (INSTRUMENTS) ×3 IMPLANT
SET TUBE SMOKE EVAC HIGH FLOW (TUBING) ×3 IMPLANT
SLEEVE ADV FIXATION 5X100MM (TROCAR) ×9 IMPLANT
SPONGE VERSALON 4X4 4PLY (MISCELLANEOUS) IMPLANT
SUT MNCRL 4-0 (SUTURE) ×4
SUT MNCRL 4-0 27XMFL (SUTURE) ×2
SUT VIC AB 2-0 SH 27 (SUTURE) ×2
SUT VIC AB 2-0 SH 27XBRD (SUTURE) ×1 IMPLANT
SUT VICRYL 0 AB UR-6 (SUTURE) ×3 IMPLANT
SUTURE MNCRL 4-0 27XMF (SUTURE) ×2 IMPLANT
TROCAR BALLN GELPORT 12X130M (ENDOMECHANICALS) ×3 IMPLANT
TROCAR Z-THREAD OPTICAL 5X100M (TROCAR) ×3 IMPLANT

## 2019-10-04 NOTE — Anesthesia Preprocedure Evaluation (Signed)
Anesthesia Evaluation  Patient identified by MRN, date of birth, ID band Patient awake    Reviewed: Allergy & Precautions, NPO status , Patient's Chart, lab work & pertinent test results  History of Anesthesia Complications Negative for: history of anesthetic complications  Airway Mallampati: I  TM Distance: >3 FB Neck ROM: Full    Dental no notable dental hx. (+) Teeth Intact, Dental Advisory Given   Pulmonary neg pulmonary ROS, neg sleep apnea, neg COPD, Patient abstained from smoking.Not current smoker,    Pulmonary exam normal breath sounds clear to auscultation       Cardiovascular Exercise Tolerance: Good METS(-) hypertension(-) CAD and (-) Past MI negative cardio ROS  (-) dysrhythmias  Rhythm:Regular Rate:Normal - Systolic murmurs    Neuro/Psych negative neurological ROS  negative psych ROS   GI/Hepatic GERD  Controlled,(+)     (-) substance abuse  ,   Endo/Other  neg diabetesHypothyroidism   Renal/GU negative Renal ROS     Musculoskeletal   Abdominal   Peds  Hematology   Anesthesia Other Findings Past Medical History: No date: B-cell lymphoma ([place]) 2018: Cancer ([place])     Comment:  lymphoma, non hodgekins B cell 12/08/2016: Chest pain 12/08/2016: Chest pain 12/08/2016: Dehydration No date: GERD (gastroesophageal reflux disease) No date: Hypothyroidism  Reproductive/Obstetrics                             Anesthesia Physical Anesthesia Plan  ASA: II  Anesthesia Plan: General   Post-op Pain Management:    Induction: Intravenous  PONV Risk Score and Plan: 4 or greater and Ondansetron, Dexamethasone and Midazolam  Airway Management Planned: Oral ETT  Additional Equipment: None  Intra-op Plan:   Post-operative Plan: Extubation in OR  Informed Consent: I have reviewed the patients History and Physical, chart, labs and discussed the procedure including the risks,  benefits and alternatives for the proposed anesthesia with the patient or authorized representative who has indicated his/her understanding and acceptance.     Dental advisory given  Plan Discussed with: CRNA and Surgeon  Anesthesia Plan Comments: (Discussed risks of anesthesia with patient, including PONV, sore throat, lip/dental damage. Rare risks discussed as well, such as cardiorespiratory sequelae. Patient understands.)        Anesthesia Quick Evaluation

## 2019-10-04 NOTE — Op Note (Signed)
  Procedure Date:  10/04/2019  Pre-operative Diagnosis:  Symptomatic cholelithiasis  Post-operative Diagnosis:  Symptomatic cholelithiasis, umbilical hernia.  Procedure:  Laparoscopic cholecystectomy and open umbilical hernia repair.  Surgeon:  Melvyn Neth, MD  Assistant:  Freddie Apley, PA-S  Anesthesia:  General endotracheal  Estimated Blood Loss:  5 ml  Specimens:  gallbladder  Complications:  None  Indications for Procedure:  This is a 59 y.o. female who presents with abdominal pain and workup revealing symptomatic cholelithiasis.  The benefits, complications, treatment options, and expected outcomes were discussed with the patient. The risks of bleeding, infection, recurrence of symptoms, failure to resolve symptoms, bile duct damage, bile duct leak, retained common bile duct stone, bowel injury, and need for further procedures were all discussed with the patient and she was willing to proceed.  Description of Procedure: The patient was correctly identified in the preoperative area and brought into the operating room.  The patient was placed supine with VTE prophylaxis in place.  Appropriate time-outs were performed.  Anesthesia was induced and the patient was intubated.  Appropriate antibiotics were infused.  The abdomen was prepped and draped in a sterile fashion. An infraumbilical incision was made. Cautery was used to dissect down the umbilical stalk to the fascia.  There was a small palpable umbilical hernia.  The stalk was detached using cautery and the hernia defect was more evident, measuring 5 mm.  The incision was extended to fit the Hasson trocar.  Pneumoperitoneum was obtained with appropriate opening pressures.  A 5-mm port was placed in the subxiphoid area and two 5-mm ports were placed in the right upper quadrant under direct visualization.  The gallbladder was identified.  The fundus was grasped and retracted cephalad.  Adhesions were lysed bluntly and with  electrocautery. The infundibulum was grasped and retracted laterally, exposing the peritoneum overlying the gallbladder.  This was incised with electrocautery and extended on either side of the gallbladder.  The cystic duct and cystic artery were clearly identified and bluntly dissected.  Both were clipped twice proximally and once distally, cutting in between.  The gallbladder was taken from the gallbladder fossa in a retrograde fashion with electrocautery. The gallbladder was placed in an Endocatch bag. The liver bed was inspected and any bleeding was controlled with electrocautery. The right upper quadrant was then inspected again revealing intact clips, no bleeding, and no ductal injury.  The 5 mm ports were removed under direct visualization and the Hasson trocar was removed.  The Endocatch bag was brought out via the umbilical incision. The fascial opening was closed using 0 vicryl suture, repairing the umbilical hernia.  Local anesthetic was infused in all incisions and the incisions were closed with 4-0 Monocryl.  The wounds were cleaned and sealed with DermaBond.  The patient was emerged from anesthesia and extubated and brought to the recovery room for further management.  The patient tolerated the procedure well and all counts were correct at the end of the case.   Melvyn Neth, MD

## 2019-10-04 NOTE — Discharge Instructions (Signed)

## 2019-10-04 NOTE — Anesthesia Postprocedure Evaluation (Signed)
Anesthesia Post Note  Patient: [name]  Procedure(s) Performed: LAPAROSCOPIC CHOLECYSTECTOMY (N/A Abdomen)  Patient location during evaluation: PACU Anesthesia Type: General Level of consciousness: awake and alert Pain management: pain level controlled Vital Signs Assessment: post-procedure vital signs reviewed and stable Respiratory status: spontaneous breathing, nonlabored ventilation, respiratory function stable and patient connected to nasal cannula oxygen Cardiovascular status: blood pressure returned to baseline and stable Postop Assessment: no apparent nausea or vomiting Anesthetic complications: no     Last Vitals:  Vitals:   10/04/19 1319 10/04/19 1329  BP: 105/64 117/65  Pulse: 74 79  Resp: 20 16  Temp: 36.8 C 36.6 C  SpO2: 96% 94%    Last Pain:  Vitals:   10/04/19 1329  TempSrc: Temporal  PainSc: 6                  Arita Miss

## 2019-10-04 NOTE — Transfer of Care (Signed)
Immediate Anesthesia Transfer of Care Note  Patient: Christina [name]  Procedure(s) Performed: LAPAROSCOPIC CHOLECYSTECTOMY (N/A Abdomen)  Patient Location: PACU  Anesthesia Type:General  Level of Consciousness: drowsy  Airway & Oxygen Therapy: spontaneous breathing with face mask  Post-op Assessment: Report given to RN and Post -op Vital signs reviewed and stable  Post vital signs: Reviewed and stable  Last Vitals:  Vitals Value Taken Time  BP 137/81 10/04/19 1150  Temp    Pulse 71 10/04/19 1150  Resp 16 10/04/19 1150  SpO2 100 % 10/04/19 1150  Vitals shown include unvalidated device data.  Last Pain:  Vitals:   10/04/19 0917  TempSrc: Temporal         Complications: No apparent anesthesia complications

## 2019-10-04 NOTE — Anesthesia Procedure Notes (Signed)
Procedure Name: Intubation Date/Time: 10/04/2019 10:15 AM Performed by: Johnna Acosta, CRNA Pre-anesthesia Checklist: Patient identified, Emergency Drugs available, Suction available, Patient being monitored and Timeout performed Patient Re-evaluated:Patient Re-evaluated prior to induction Oxygen Delivery Method: Circle system utilized Preoxygenation: Pre-oxygenation with 100% oxygen Induction Type: IV induction Ventilation: Mask ventilation without difficulty Laryngoscope Size: Miller and 2 Grade View: Grade I Tube type: Oral Tube size: 7.0 mm Number of attempts: 1 Airway Equipment and Method: Stylet and Oral airway Placement Confirmation: ETT inserted through vocal cords under direct vision,  positive ETCO2 and breath sounds checked- equal and bilateral Secured at: 21 cm Tube secured with: Tape Dental Injury: Teeth and Oropharynx as per pre-operative assessment

## 2019-10-04 NOTE — Interval H&P Note (Signed)
History and Physical Interval Note:  10/04/2019 9:39 AM  Christina [name]  has presented today for surgery, with the diagnosis of symptomatic cholelithiasis.  The various methods of treatment have been discussed with the patient and family. After consideration of risks, benefits and other options for treatment, the patient has consented to  Procedure(s): LAPAROSCOPIC CHOLECYSTECTOMY (N/A) as a surgical intervention.  The patient's history has been reviewed, patient examined, no change in status, stable for surgery.  I have reviewed the patient's chart and labs.  Questions were answered to the patient's satisfaction.     Anani Gu

## 2019-10-05 LAB — SURGICAL PATHOLOGY

## 2019-10-18 ENCOUNTER — Ambulatory Visit (INDEPENDENT_AMBULATORY_CARE_PROVIDER_SITE_OTHER): Payer: Self-pay | Admitting: Physician Assistant

## 2019-10-18 ENCOUNTER — Other Ambulatory Visit: Payer: Self-pay

## 2019-10-18 ENCOUNTER — Encounter: Payer: Self-pay | Admitting: Physician Assistant

## 2019-10-18 VITALS — BP 119/85 | HR 70 | Temp 97.7°F | Resp 12 | Ht 66.0 in | Wt 185.0 lb

## 2019-10-18 DIAGNOSIS — K8020 Calculus of gallbladder without cholecystitis without obstruction: Secondary | ICD-10-CM

## 2019-10-18 DIAGNOSIS — Z09 Encounter for follow-up examination after completed treatment for conditions other than malignant neoplasm: Secondary | ICD-10-CM

## 2019-10-18 MED ORDER — IBUPROFEN 600 MG PO TABS: 600.0000 mg | ORAL_TABLET | Freq: Four times a day (QID) | ORAL | 0 refills | Status: DC | PRN

## 2019-10-18 NOTE — Progress Notes (Signed)
[facility] POST-OP OFFICE VISIT  10/18/2019  HPI: Christina [name] is a 59 y.o. female 14 days s/p laparoscopic cholecystectomy and umbilical hernia repair for symptomatic cholelithiasis with Dr Hampton Abbot.   Today, she reports that she had been doing well from a surgical standpoint. No fever, chills, nausea, or emesis. No issues with abdominal pain. Tolerating PO and having normal bowel function.   She does report having right lower back and posterior hip pain since around christmas. This radiates down her leg. She has tried heating pads and anti-inflammatories with some relief. She was not sure if this was related to surgical or not. No loss of bladder or function, no saddle anesthesia.   Vital signs: BP 119/85   Pulse 70   Temp 97.7 F (36.5 C) (Temporal)   Resp 12   Ht 5\' 6"  (1.676 m)   Wt 185 lb (83.9 kg)   LMP  (LMP Unknown)   SpO2 97%   BMI 29.86 kg/m    Physical Exam: Constitutional: Well appearing female, NAD Abdomen: Soft, non-tender, non-distended, no rebound/guarding Skin: Laparoscopic incisions are well healed, no erythema or drainage Musculoskeletal: Point tender over the right PSIS  Assessment/Plan: This is a 59 y.o. female 14 days s/p laparoscopic cholecystectomy and umbilical hernia repair for symptomatic cholelithiasis   - Recommend anti-inflammatories for pain, suspect some component of sciatic irritation. Heat/Ice/Stretching. If pain does not improve recommend evaluation from PCP  - Reviewed wound care and post-op restrictions  - Reviewed pathology: Mild chronic cholecystitis  - follow up with general surgery prn  -- Edison Simon, PA-C [facility] 10/18/2019, 10:36 AM [phone] M-F: 7am - 4pm

## 2019-10-18 NOTE — Patient Instructions (Addendum)
  Please call if you have questions or concerns.   GENERAL POST-OPERATIVE PATIENT INSTRUCTIONS   WOUND CARE INSTRUCTIONS:  Keep a dry clean dressing on the wound if there is drainage. The initial bandage may be removed after 24 hours.  Once the wound has quit draining you may leave it open to air.  If clothing rubs against the wound or causes irritation and the wound is not draining you may cover it with a dry dressing during the daytime.  Try to keep the wound dry and avoid ointments on the wound unless directed to do so.  If the wound becomes bright red and painful or starts to drain infected material that is not clear, please contact your physician immediately.  If the wound is mildly pink and has a thick firm ridge underneath it, this is normal, and is referred to as a healing ridge.  This will resolve over the next 4-6 weeks.  BATHING: You may shower if you have been informed of this by your surgeon. However, Please do not submerge in a tub, hot tub, or pool until incisions are completely sealed or have been told by your surgeon that you may do so.  DIET:  You may eat any foods that you can tolerate.  It is a good idea to eat a high fiber diet and take in plenty of fluids to prevent constipation.  If you do become constipated you may want to take a mild laxative or take ducolax tablets on a daily basis until your bowel habits are regular.  Constipation can be very uncomfortable, along with straining, after recent surgery.  ACTIVITY:  You are encouraged to cough and deep breath or use your incentive spirometer if you were given one, every 15-30 minutes when awake.  This will help prevent respiratory complications and low grade fevers post-operatively if you had a general anesthetic.  You may want to hug a pillow when coughing and sneezing to add additional support to the surgical area, if you had abdominal or chest surgery, which will decrease pain during these times.  You are encouraged to walk and  engage in light activity for the next two weeks.  You should not lift more than 20 pounds, until 11/15/2019  as it could put you at increased risk for complications.  Twenty pounds is roughly equivalent to a plastic bag of groceries. At that time- Listen to your body when lifting, if you have pain when lifting, stop and then try again in a few days. Soreness after doing exercises or activities of daily living is normal as you get back in to your normal routine.  MEDICATIONS:  Try to take narcotic medications and anti-inflammatory medications, such as tylenol, ibuprofen, naprosyn, etc., with food.  This will minimize stomach upset from the medication.  Should you develop nausea and vomiting from the pain medication, or develop a rash, please discontinue the medication and contact your physician.  You should not drive, make important decisions, or operate machinery when taking narcotic pain medication.  SUNBLOCK Use sun block to incision area over the next year if this area will be exposed to sun. This helps decrease scarring and will allow you avoid a permanent darkened area over your incision.  QUESTIONS:  Please feel free to call our office if you have any questions, and we will be glad to assist you. [phone]

## 2019-10-24 ENCOUNTER — Telehealth: Payer: Self-pay | Admitting: Oncology

## 2019-10-24 NOTE — Telephone Encounter (Signed)
MD will not be at the office on 11-29-19. Lab appt rescheduled for 11-22-19 and MD was rescheduled for 11-23-19.

## 2019-11-22 ENCOUNTER — Other Ambulatory Visit: Payer: Self-pay

## 2019-11-22 ENCOUNTER — Inpatient Hospital Stay: Payer: 59 | Attending: Oncology

## 2019-11-22 DIAGNOSIS — Z8579 Personal history of other malignant neoplasms of lymphoid, hematopoietic and related tissues: Secondary | ICD-10-CM | POA: Diagnosis present

## 2019-11-22 DIAGNOSIS — Z08 Encounter for follow-up examination after completed treatment for malignant neoplasm: Secondary | ICD-10-CM | POA: Insufficient documentation

## 2019-11-22 LAB — CBC WITH DIFFERENTIAL/PLATELET
Abs Immature Granulocytes: 0.02 10*3/uL (ref 0.00–0.07)
Basophils Absolute: 0 10*3/uL (ref 0.0–0.1)
Basophils Relative: 1 %
Eosinophils Absolute: 0 10*3/uL (ref 0.0–0.5)
Eosinophils Relative: 1 %
HCT: 40.1 % (ref 36.0–46.0)
Hemoglobin: 12.9 g/dL (ref 12.0–15.0)
Immature Granulocytes: 0 %
Lymphocytes Relative: 31 %
Lymphs Abs: 2.1 10*3/uL (ref 0.7–4.0)
MCH: 28.9 pg (ref 26.0–34.0)
MCHC: 32.2 g/dL (ref 30.0–36.0)
MCV: 89.9 fL (ref 80.0–100.0)
Monocytes Absolute: 0.6 10*3/uL (ref 0.1–1.0)
Monocytes Relative: 9 %
Neutro Abs: 4 10*3/uL (ref 1.7–7.7)
Neutrophils Relative %: 58 %
Platelets: 271 10*3/uL (ref 150–400)
RBC: 4.46 MIL/uL (ref 3.87–5.11)
RDW: 12.5 % (ref 11.5–15.5)
WBC: 6.8 10*3/uL (ref 4.0–10.5)
nRBC: 0 % (ref 0.0–0.2)

## 2019-11-22 LAB — COMPREHENSIVE METABOLIC PANEL
ALT: 19 U/L (ref 0–44)
AST: 19 U/L (ref 15–41)
Albumin: 4.1 g/dL (ref 3.5–5.0)
Alkaline Phosphatase: 126 U/L (ref 38–126)
Anion gap: 9 (ref 5–15)
BUN: 14 mg/dL (ref 6–20)
CO2: 24 mmol/L (ref 22–32)
Calcium: 9.3 mg/dL (ref 8.9–10.3)
Chloride: 104 mmol/L (ref 98–111)
Creatinine, Ser: 0.9 mg/dL (ref 0.44–1.00)
GFR calc Af Amer: >60 mL/min (ref >60)
GFR calc non Af Amer: >60 mL/min (ref >60)
Glucose, Bld: 113 mg/dL — ABNORMAL HIGH (ref 70–99)
Potassium: 3.8 mmol/L (ref 3.5–5.1)
Sodium: 137 mmol/L (ref 135–145)
Total Bilirubin: 0.4 mg/dL (ref 0.3–1.2)
Total Protein: 7.4 g/dL (ref 6.5–8.1)

## 2019-11-22 LAB — LACTATE DEHYDROGENASE: LDH: 142 U/L (ref 98–192)

## 2019-11-23 ENCOUNTER — Encounter: Payer: Self-pay | Admitting: Nurse Practitioner

## 2019-11-23 ENCOUNTER — Inpatient Hospital Stay (HOSPITAL_BASED_OUTPATIENT_CLINIC_OR_DEPARTMENT_OTHER): Payer: 59 | Admitting: Oncology

## 2019-11-23 ENCOUNTER — Encounter: Payer: Self-pay | Admitting: Oncology

## 2019-11-23 ENCOUNTER — Other Ambulatory Visit: Payer: Self-pay

## 2019-11-23 DIAGNOSIS — Z08 Encounter for follow-up examination after completed treatment for malignant neoplasm: Secondary | ICD-10-CM | POA: Diagnosis not present

## 2019-11-23 DIAGNOSIS — Z8579 Personal history of other malignant neoplasms of lymphoid, hematopoietic and related tissues: Secondary | ICD-10-CM | POA: Diagnosis not present

## 2019-11-23 NOTE — Progress Notes (Signed)
Patient stated that she had been doing well with no complaints. Patient is going out of the country and would like to know where she could go and have a test done.

## 2019-11-26 ENCOUNTER — Ambulatory Visit: Payer: 59 | Attending: Internal Medicine

## 2019-11-26 DIAGNOSIS — Z20822 Contact with and (suspected) exposure to covid-19: Secondary | ICD-10-CM

## 2019-11-26 NOTE — Progress Notes (Signed)
I connected with Christina [name] on 11/26/19 at  1:15 PM EST by video enabled telemedicine visit and verified that I am speaking with the correct person using two identifiers.   I discussed the limitations, risks, security and privacy concerns of performing an evaluation and management service by telemedicine and the availability of in-person appointments. I also discussed with the patient that there may be a patient responsible charge related to this service. The patient expressed understanding and agreed to proceed.  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 Patient is a59 year old female who presented to her primary care doctor with symptoms of right neck swelling in January 2018. Her symptoms of an ongoing since December 2017. Patient works at a [employer] (medial in the swelling but the swelling did not go away. She was then referred to ENT.   2. CT of the soft tissue neck with contrast showed malignant right leg lymphadenopathy with heterogeneous enhancement and extracapsular extension occupying both the right level II and level III nodal stations. Individually B abnormal lymph nodes measure up to 4.2 cm in largest dimension and the conglomerulationof abnormal lymph nodes is 5-5.5 cm across. There is a small but asymmetric and conspicuity 7 mm lymph node along the lower right 3-Bstation. Mass effect on the right carotid space from the abnormal nodes but the major vascular structures in the neck at the skull base including the right IJ remained patent.  3. 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 Cells were CD20 positive and BCL 6 and partially dim BCL-2 positive. B cells were negative for C myc (10%).  Mom one was positive in 50% of the cells. Ki-67 was 50%.Bone marrow biopsy was negative for lymphoma involvement  4. PET/CT showed IMPRESSION: 1. FDG avid malignancy is identified in the level 2 and 3 nodal stations of the right cervical lymph node chain, consistent with the patient's known lymphoma. 2. Focal uptake in the gastric cardia is suspicious for malignancy as well. Recommend direct visualization and biopsy as clinically Warranted.  5. EGD showed diffuse moderately erythematous mucosa without bleeding initial nonbleeding gastric ulcer 6 mm which was biopsied. Findings were consistent with H. pylori gastritis and patient was started on antibiotictherapy for the same. Repeat H pylori testing negative  6. EUS also showed H pylori. There were few B cells noted in lamina propria and clonality studies have been ordered. Initial testing for B cell clonality showed no monoclonal B-cell population. Confirmation testing also did not reveal clonal B cells  7. PET/CT scan after cycles of RCHOP showed: IMPRESSION: 1. Complete metabolic response. No metabolically active lymphoma. 2. Uniform low-level marrow hypermetabolism throughout the axial skeleton, compatible with reactive marrow state due to chemotherapy. 3. No residual gastric hypermetabolism  8. She completed3 cycles of RCHOP between feb 2018-aoril 2018 followed byIFRTto her neck in June 2018  Interval history: Patient reports doing well and denies any complaints presently.  Appetite and weight are good.  Neuropathy is improved significantly.  Denies any fatigue, drenching night sweats or lumps or bumps anywhere.   Review of Systems  Constitutional: Negative for chills, fever, malaise/fatigue and weight loss.  HENT: Negative for congestion, ear discharge and nosebleeds.   Eyes: Negative for blurred vision.  Respiratory: Negative for cough, hemoptysis,  sputum production, shortness of breath and wheezing.   Cardiovascular:  Negative for chest pain, palpitations, orthopnea and claudication.  Gastrointestinal: Negative for abdominal pain, blood in stool, constipation, diarrhea, heartburn, melena, nausea and vomiting.  Genitourinary: Negative for dysuria, flank pain, frequency, hematuria and urgency.  Musculoskeletal: Negative for back pain, joint pain and myalgias.  Skin: Negative for rash.  Neurological: Negative for dizziness, tingling, focal weakness, seizures, weakness and headaches.  Endo/Heme/Allergies: Does not bruise/bleed easily.  Psychiatric/Behavioral: Negative for depression and suicidal ideas. The patient does not have insomnia.     No Known Allergies  Past Medical History:  Diagnosis Date  . B-cell lymphoma ([place])   . Cancer ([place]) 2018   lymphoma, non hodgekins B cell  . Chest pain 12/08/2016  . Chest pain 12/08/2016  . Dehydration 12/08/2016  . GERD (gastroesophageal reflux disease)   . Hypothyroidism     Past Surgical History:  Procedure Laterality Date  . ABDOMINAL HYSTERECTOMY    . [place]   x 2   . CHOLECYSTECTOMY N/A 10/04/2019   Procedure: LAPAROSCOPIC CHOLECYSTECTOMY;  Surgeon: Olean Ree, MD;  Location: [facility] ORS;  Service: General;  Laterality: N/A;  . COLONOSCOPY WITH PROPOFOL N/A 09/28/2017   Procedure: COLONOSCOPY WITH PROPOFOL;  Surgeon: Jonathon Bellows, MD;  [facility] ENDOSCOPY;  Service: Gastroenterology;  Laterality: N/A;  . ESOPHAGOGASTRODUODENOSCOPY (EGD) WITH PROPOFOL N/A 11/11/2016   Procedure: ESOPHAGOGASTRODUODENOSCOPY (EGD) WITH PROPOFOL;  Surgeon: Jonathon Bellows, MD;  Location: [facility] ENDOSCOPY;  Service: Endoscopy;  Laterality: N/A;  . IR GENERIC HISTORICAL  11/03/2016   IR FLUORO GUIDE PORT INSERTION RIGHT 11/03/2016 Aletta Edouard, MD [facility]-INTERV RAD  . IR REMOVAL TUN ACCESS W/ PORT W/O FL MOD SED  02/15/2017  . REFRACTIVE SURGERY  09/2015    Social History   Socioeconomic History  . Marital status: Single    Spouse name: Not on file  . Number  of children: Not on file  . Years of education: Not on file  . Highest education level: Not on file  Occupational History  . Not on file  Tobacco Use  . Smoking status: Never Smoker  . Smokeless tobacco: Never Used  Substance and Sexual Activity  . Alcohol use: No  . Drug use: No  . Sexual activity: Yes  Other Topics Concern  . Not on file  Social History Narrative  . Not on file   Social Determinants of Health   Financial Resource Strain:   . Difficulty of Paying Living Expenses: Not on file  Food Insecurity:   . Worried About Charity fundraiser in the Last Year: Not on file  . Ran Out of Food in the Last Year: Not on file  Transportation Needs:   . Lack of Transportation (Medical): Not on file  . Lack of Transportation (Non-Medical): Not on file  Physical Activity:   . Days of Exercise per Week: Not on file  . Minutes of Exercise per Session: Not on file  Stress:   . Feeling of Stress : Not on file  Social Connections:   . Frequency of Communication with Friends and Family: Not on file  . Frequency of Social Gatherings with Friends and Family: Not on file  . Attends Religious Services: Not on file  . Active Member of Clubs or Organizations: Not on file  . Attends Archivist Meetings: Not on file  . Marital Status: Not on file  Intimate Partner Violence:   . Fear of Current or Ex-Partner: Not  on file  . Emotionally Abused: Not on file  . Physically Abused: Not on file  . Sexually Abused: Not on file    Family History  Problem Relation Age of Onset  . Cancer Mother        breast  . Cancer Father        lung  . Heart disease Brother 62       MI  . Heart disease Paternal Grandfather        MI     Current Outpatient Medications:  .  Cholecalciferol (VITAMIN D3 GUMMIES ADULT PO), Take 2 Doses by mouth 2 (two) times daily., Disp: , Rfl:  .  Cyanocobalamin ([employer] B12 GUMMIES PO), Take 2 Doses by mouth 2 (two) times daily., Disp: , Rfl:  .  gabapentin  (NEURONTIN) 100 MG capsule, Take 1 capsule (100 mg total) by mouth at bedtime., Disp: 90 capsule, Rfl: 3 .  levothyroxine (SYNTHROID, LEVOTHROID) 50 MCG tablet, Take 1 tablet (50 mcg total) by mouth daily., Disp: 90 tablet, Rfl: 2 .  YUVAFEM 10 MCG TABS vaginal tablet, Place 10 mcg vaginally every Tuesday. , Disp: , Rfl:   No results found.  No images are attached to the encounter.   CMP Latest Ref Rng & Units 11/22/2019  Glucose 70 - 99 mg/dL 113(H)  BUN 6 - 20 mg/dL 14  Creatinine 0.44 - 1.00 mg/dL 0.90  Sodium 135 - 145 mmol/L 137  Potassium 3.5 - 5.1 mmol/L 3.8  Chloride 98 - 111 mmol/L 104  CO2 22 - 32 mmol/L 24  Calcium 8.9 - 10.3 mg/dL 9.3  Total Protein 6.5 - 8.1 g/dL 7.4  Total Bilirubin 0.3 - 1.2 mg/dL 0.4  Alkaline Phos 38 - 126 U/L 126  AST 15 - 41 U/L 19  ALT 0 - 44 U/L 19   CBC Latest Ref Rng & Units 11/22/2019  WBC 4.0 - 10.5 K/uL 6.8  Hemoglobin 12.0 - 15.0 g/dL 12.9  Hematocrit 36.0 - 46.0 % 40.1  Platelets 150 - 400 K/uL 271     Observation/objective: Appears in no acute distress over video visit today.  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  This is a routine follow-up visit for DLBCL.  Patient continues to have normal labs CBC with differential CMP and LDH.  Clinically she is doing well and no concerning symptoms of recurrence.  She does not require any surveillance imaging.   Follow-up instructions:I will see her in 6 months with CBC CMP and LDH for in person visit for lymph node exam I discussed the assessment and treatment plan with the patient. The patient was provided an opportunity to ask questions and all were answered. The patient agreed with the plan and demonstrated an understanding of the instructions.   The patient was advised to call back or seek an in-person evaluation if the symptoms worsen or if the  condition fails to improve as anticipated.    Visit Diagnosis: 1. Encounter for follow-up surveillance of diffuse large B-cell lymphoma     Dr. Randa Evens, MD, MPH [facility] Tel- [phone] 11/26/2019 12:48 PM

## 2019-11-27 LAB — NOVEL CORONAVIRUS, NAA: SARS-CoV-2, NAA: NOT DETECTED

## 2019-11-29 ENCOUNTER — Other Ambulatory Visit: Payer: 59

## 2019-11-29 ENCOUNTER — Ambulatory Visit: Payer: 59 | Admitting: Oncology

## 2020-02-06 ENCOUNTER — Other Ambulatory Visit: Payer: Self-pay | Admitting: Nurse Practitioner

## 2020-02-06 NOTE — Telephone Encounter (Signed)
Requested Prescriptions  Pending Prescriptions Disp Refills  . levothyroxine (SYNTHROID) 50 MCG tablet [Pharmacy Med Name: LEVOTHYROXINE 50 MCG TABLET] 90 tablet 2    Sig: TAKE 1 TABLET BY MOUTH EVERY DAY     Endocrinology:  Hypothyroid Agents Failed - 02/06/2020  1:12 AM      Failed - TSH needs to be rechecked within 3 months after an abnormal result. Refill until TSH is due.      Passed - TSH in normal range and within 360 days    TSH  Date Value Ref Range Status  06/27/2019 3.440 0.450 - 4.500 uIU/mL Final         Passed - Valid encounter within last 12 months    Recent Outpatient Visits          5 months ago Neuropathy   [place], [place] T, NP   7 months ago Hypothyroidism, unspecified type   [place], Barbaraann Faster, NP   8 months ago Neuropathy   [place], Barbaraann Faster, NP   9 months ago Neuropathy   [place], [place] T, NP   1 year ago Hypothyroidism, unspecified type   [facility] [place], Barbaraann Faster, NP

## 2020-05-23 ENCOUNTER — Inpatient Hospital Stay (HOSPITAL_BASED_OUTPATIENT_CLINIC_OR_DEPARTMENT_OTHER): Payer: 59 | Admitting: Oncology

## 2020-05-23 ENCOUNTER — Other Ambulatory Visit: Payer: Self-pay

## 2020-05-23 ENCOUNTER — Inpatient Hospital Stay: Payer: 59 | Attending: Oncology

## 2020-05-23 ENCOUNTER — Encounter: Payer: Self-pay | Admitting: Oncology

## 2020-05-23 VITALS — BP 117/92 | HR 62 | Temp 97.2°F | Resp 18 | Ht 66.0 in | Wt 182.1 lb

## 2020-05-23 DIAGNOSIS — Z08 Encounter for follow-up examination after completed treatment for malignant neoplasm: Secondary | ICD-10-CM

## 2020-05-23 DIAGNOSIS — Z79899 Other long term (current) drug therapy: Secondary | ICD-10-CM | POA: Insufficient documentation

## 2020-05-23 DIAGNOSIS — Z8249 Family history of ischemic heart disease and other diseases of the circulatory system: Secondary | ICD-10-CM | POA: Insufficient documentation

## 2020-05-23 DIAGNOSIS — Z8572 Personal history of non-Hodgkin lymphomas: Secondary | ICD-10-CM | POA: Insufficient documentation

## 2020-05-23 DIAGNOSIS — Z803 Family history of malignant neoplasm of breast: Secondary | ICD-10-CM | POA: Diagnosis not present

## 2020-05-23 DIAGNOSIS — Z8579 Personal history of other malignant neoplasms of lymphoid, hematopoietic and related tissues: Secondary | ICD-10-CM

## 2020-05-23 DIAGNOSIS — Z801 Family history of malignant neoplasm of trachea, bronchus and lung: Secondary | ICD-10-CM | POA: Diagnosis not present

## 2020-05-23 DIAGNOSIS — Z9049 Acquired absence of other specified parts of digestive tract: Secondary | ICD-10-CM | POA: Insufficient documentation

## 2020-05-23 LAB — CBC WITH DIFFERENTIAL/PLATELET
Abs Immature Granulocytes: 0.02 10*3/uL (ref 0.00–0.07)
Basophils Absolute: 0 10*3/uL (ref 0.0–0.1)
Basophils Relative: 1 %
Eosinophils Absolute: 0.1 10*3/uL (ref 0.0–0.5)
Eosinophils Relative: 1 %
HCT: 40.9 % (ref 36.0–46.0)
Hemoglobin: 13.9 g/dL (ref 12.0–15.0)
Immature Granulocytes: 0 %
Lymphocytes Relative: 28 %
Lymphs Abs: 2 10*3/uL (ref 0.7–4.0)
MCH: 29.6 pg (ref 26.0–34.0)
MCHC: 34 g/dL (ref 30.0–36.0)
MCV: 87 fL (ref 80.0–100.0)
Monocytes Absolute: 0.6 10*3/uL (ref 0.1–1.0)
Monocytes Relative: 9 %
Neutro Abs: 4.4 10*3/uL (ref 1.7–7.7)
Neutrophils Relative %: 61 %
Platelets: 257 10*3/uL (ref 150–400)
RBC: 4.7 MIL/uL (ref 3.87–5.11)
RDW: 12.3 % (ref 11.5–15.5)
WBC: 7.1 10*3/uL (ref 4.0–10.5)
nRBC: 0 % (ref 0.0–0.2)

## 2020-05-23 LAB — COMPREHENSIVE METABOLIC PANEL
ALT: 18 U/L (ref 0–44)
AST: 18 U/L (ref 15–41)
Albumin: 4.6 g/dL (ref 3.5–5.0)
Alkaline Phosphatase: 116 U/L (ref 38–126)
Anion gap: 10 (ref 5–15)
BUN: 18 mg/dL (ref 6–20)
CO2: 26 mmol/L (ref 22–32)
Calcium: 9.5 mg/dL (ref 8.9–10.3)
Chloride: 105 mmol/L (ref 98–111)
Creatinine, Ser: 0.92 mg/dL (ref 0.44–1.00)
GFR calc Af Amer: >60 mL/min (ref >60)
GFR calc non Af Amer: >60 mL/min (ref >60)
Glucose, Bld: 102 mg/dL — ABNORMAL HIGH (ref 70–99)
Potassium: 4.7 mmol/L (ref 3.5–5.1)
Sodium: 141 mmol/L (ref 135–145)
Total Bilirubin: 0.5 mg/dL (ref 0.3–1.2)
Total Protein: 7.5 g/dL (ref 6.5–8.1)

## 2020-05-23 LAB — LACTATE DEHYDROGENASE: LDH: 129 U/L (ref 98–192)

## 2020-05-23 NOTE — Progress Notes (Signed)
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 Patient is a59 year old female who presented to her primary care doctor with symptoms of right neck swelling in January 2018. Her symptoms of an ongoing since December 2017. Patient works at a [employer] (medial in the swelling but the swelling did not go away. She was then referred to ENT.   2. CT of the soft tissue neck with contrast showed malignant right leg lymphadenopathy with heterogeneous enhancement and extracapsular extension occupying both the right level II and level III nodal stations. Individually B abnormal lymph nodes measure up to 4.2 cm in largest dimension and the conglomerulationof abnormal lymph nodes is 5-5.5 cm across. There is a small but asymmetric and conspicuity 7 mm lymph node along the lower right 3-Bstation. Mass effect on the right carotid space from the abnormal nodes but the major vascular structures in the neck at the skull base including the right IJ remained patent.  3. 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 Cells were CD20 positive and BCL 6 and partially dim BCL-2 positive. B cells were negative for C myc (10%). Mom one was positive in 50% of the cells. Ki-67 was 50%.Bone marrow biopsy was negative for lymphoma involvement  4. PET/CT showed IMPRESSION: 1. FDG avid  malignancy is identified in the level 2 and 3 nodal stations of the right cervical lymph node chain, consistent with the patient's known lymphoma. 2. Focal uptake in the gastric cardia is suspicious for malignancy as well. Recommend direct visualization and biopsy as clinically Warranted.  5. EGD showed diffuse moderately erythematous mucosa without bleeding initial nonbleeding gastric ulcer 6 mm which was biopsied. Findings were consistent with H. pylori gastritis and patient was started on antibiotictherapy for the same. Repeat H pylori testing negative  6. EUS also showed H pylori. There were few B cells noted in lamina propria and clonality studies have been ordered. Initial testing for B cell clonality showed no monoclonal B-cell population. Confirmation testing also did not reveal clonal B cells  7. PET/CT scan after cycles of RCHOP showed: IMPRESSION: 1. Complete metabolic response. No metabolically active lymphoma. 2. Uniform low-level marrow hypermetabolism throughout the axial skeleton, compatible with reactive marrow state due to chemotherapy. 3. No residual gastric hypermetabolism  8. She completed3 cycles of RCHOP between feb 2018-aoril 2018 followed byIFRTto her neck in June 2018   Interval history- Patient reports doing well denies any complaints at this time.  Appetite and weight has remained stable.  Denies any significant fatigue unintentional weight loss, drenching night sweats or palpable lumps  ECOG PS- 0 Pain scale- 0   Review of systems- Review of Systems  Constitutional: Negative for chills, fever, malaise/fatigue and weight loss.  HENT: Negative for congestion, ear discharge and nosebleeds.   Eyes: Negative for blurred vision.  Respiratory: Negative for cough, hemoptysis, sputum production, shortness of breath and wheezing.   Cardiovascular: Negative for chest pain, palpitations, orthopnea  and claudication.  Gastrointestinal: Negative for abdominal  pain, blood in stool, constipation, diarrhea, heartburn, melena, nausea and vomiting.  Genitourinary: Negative for dysuria, flank pain, frequency, hematuria and urgency.  Musculoskeletal: Negative for back pain, joint pain and myalgias.  Skin: Negative for rash.  Neurological: Negative for dizziness, tingling, focal weakness, seizures, weakness and headaches.  Endo/Heme/Allergies: Does not bruise/bleed easily.  Psychiatric/Behavioral: Negative for depression and suicidal ideas. The patient does not have insomnia.       No Known Allergies   Past Medical History:  Diagnosis Date  . B-cell lymphoma ([place])   . Cancer ([place]) 2018   lymphoma, non hodgekins B cell  . Chest pain 12/08/2016  . Chest pain 12/08/2016  . Dehydration 12/08/2016  . GERD (gastroesophageal reflux disease)   . Hypothyroidism      Past Surgical History:  Procedure Laterality Date  . ABDOMINAL HYSTERECTOMY    . [place]   x 2   . CHOLECYSTECTOMY N/A 10/04/2019   Procedure: LAPAROSCOPIC CHOLECYSTECTOMY;  Surgeon: Olean Ree, MD;  Location: [facility] ORS;  Service: General;  Laterality: N/A;  . COLONOSCOPY WITH PROPOFOL N/A 09/28/2017   Procedure: COLONOSCOPY WITH PROPOFOL;  Surgeon: Jonathon Bellows, MD;  Location: [facility] ENDOSCOPY;  Service: Gastroenterology;  Laterality: N/A;  . ESOPHAGOGASTRODUODENOSCOPY (EGD) WITH PROPOFOL N/A 11/11/2016   Procedure: ESOPHAGOGASTRODUODENOSCOPY (EGD) WITH PROPOFOL;  Surgeon: Jonathon Bellows, MD;  Location: [facility] ENDOSCOPY;  Service: Endoscopy;  Laterality: N/A;  . IR GENERIC HISTORICAL  11/03/2016   IR FLUORO GUIDE PORT INSERTION RIGHT 11/03/2016 Aletta Edouard, MD [facility]-INTERV RAD  . IR REMOVAL TUN ACCESS W/ PORT W/O FL MOD SED  02/15/2017  . REFRACTIVE SURGERY  09/2015    Social History   Socioeconomic History  . Marital status: Single    Spouse name: Not on file  . Number of children: Not on file  . Years of education: Not on file  . Highest education level: Not on file    Occupational History  . Not on file  Tobacco Use  . Smoking status: Never Smoker  . Smokeless tobacco: Never Used  Vaping Use  . Vaping Use: Never used  Substance and Sexual Activity  . Alcohol use: No  . Drug use: No  . Sexual activity: Yes  Other Topics Concern  . Not on file  Social History Narrative  . Not on file   Social Determinants of Health   Financial Resource Strain:   . Difficulty of Paying Living Expenses: Not on file  Food Insecurity:   . Worried About Charity fundraiser in the Last Year: Not on file  . Ran Out of Food in the Last Year: Not on file  Transportation Needs:   . Lack of Transportation (Medical): Not on file  . Lack of Transportation (Non-Medical): Not on file  Physical Activity:   . Days of Exercise per Week: Not on file  . Minutes of Exercise per Session: Not on file  Stress:   . Feeling of Stress : Not on file  Social Connections:   . Frequency of Communication with Friends and Family: Not on file  . Frequency of Social Gatherings with Friends and Family: Not on file  . Attends Religious Services: Not on file  . Active Member of Clubs or Organizations: Not on file  . Attends Archivist Meetings: Not on file  . Marital Status: Not on file  Intimate Partner Violence:   . Fear of Current or Ex-Partner: Not on file  .  Emotionally Abused: Not on file  . Physically Abused: Not on file  . Sexually Abused: Not on file    Family History  Problem Relation Age of Onset  . Cancer Mother        breast  . Cancer Father        lung  . Heart disease Brother 39       MI  . Heart disease Paternal Grandfather        MI     Current Outpatient Medications:  .  gabapentin (NEURONTIN) 100 MG capsule, Take 1 capsule (100 mg total) by mouth at bedtime., Disp: 90 capsule, Rfl: 3 .  levothyroxine (SYNTHROID) 50 MCG tablet, TAKE 1 TABLET BY MOUTH EVERY DAY, Disp: 90 tablet, Rfl: 1 .  YUVAFEM 10 MCG TABS vaginal tablet, Place 10 mcg vaginally  every Tuesday. , Disp: , Rfl:  .  Cholecalciferol (VITAMIN D3 GUMMIES ADULT PO), Take 2 Doses by mouth 2 (two) times daily. (Patient not taking: Reported on 05/23/2020), Disp: , Rfl:  .  Cyanocobalamin ([employer] B12 GUMMIES PO), Take 2 Doses by mouth 2 (two) times daily. (Patient not taking: Reported on 05/23/2020), Disp: , Rfl:   Physical exam:  Vitals:   05/23/20 0944  BP: (!) 117/92  Pulse: 62  Resp: 18  Temp: (!) 97.2 F (36.2 C)  TempSrc: Tympanic  SpO2: 99%  Weight: 182 lb 1.6 oz (82.6 kg)  Height: _0  (1.676 m)   Physical Exam Cardiovascular:     Rate and Rhythm: Normal rate and regular rhythm.     Heart sounds: Normal heart sounds.  Pulmonary:     Effort: Pulmonary effort is normal.     Breath sounds: Normal breath sounds.  Abdominal:     General: Bowel sounds are normal.     Palpations: Abdomen is soft.     Comments: No palpable splenomegaly  Lymphadenopathy:     Comments: No palpable cervical, supraclavicular, axillary or inguinal adenopathy   Skin:    General: Skin is warm and dry.  Neurological:     Mental Status: She is alert and oriented to person, place, and time.      CMP Latest Ref Rng & Units 05/23/2020  Glucose 70 - 99 mg/dL 102(H)  BUN 6 - 20 mg/dL 18  Creatinine 0.44 - 1.00 mg/dL 0.92  Sodium 135 - 145 mmol/L 141  Potassium 3.5 - 5.1 mmol/L 4.7  Chloride 98 - 111 mmol/L 105  CO2 22 - 32 mmol/L 26  Calcium 8.9 - 10.3 mg/dL 9.5  Total Protein 6.5 - 8.1 g/dL 7.5  Total Bilirubin 0.3 - 1.2 mg/dL 0.5  Alkaline Phos 38 - 126 U/L 116  AST 15 - 41 U/L 18  ALT 0 - 44 U/L 18   CBC Latest Ref Rng & Units 05/23/2020  WBC 4.0 - 10.5 K/uL 7.1  Hemoglobin 12.0 - 15.0 g/dL 13.9  Hematocrit 36 - 46 % 40.9  Platelets 150 - 400 K/uL 257     Assessment and plan- Patient is a 59 y.o. 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  This is a routine follow-up visit  CBC CMP and LDH are unremarkable.   Clinically patient is doing well with no concerning signs and symptoms of recurrence based on today's exam.  No palpable adenopathy or splenomegaly.  Clinically she continues to be in remission.  I will see her back in 6 months with CBC  with differential CMP and LDH   Visit Diagnosis 1. Encounter for follow-up surveillance of diffuse large B-cell lymphoma      Dr. Randa Evens, MD, MPH [facility] [phone] 05/23/2020 9:45 AM

## 2020-07-03 LAB — HM MAMMOGRAPHY

## 2020-07-23 ENCOUNTER — Other Ambulatory Visit: Payer: Self-pay

## 2020-07-23 ENCOUNTER — Encounter: Payer: Self-pay | Admitting: Nurse Practitioner

## 2020-07-23 ENCOUNTER — Ambulatory Visit: Payer: 59 | Admitting: Nurse Practitioner

## 2020-07-23 VITALS — BP 108/74 | HR 70 | Temp 97.8°F | Ht 66.0 in | Wt 183.2 lb

## 2020-07-23 DIAGNOSIS — E039 Hypothyroidism, unspecified: Secondary | ICD-10-CM | POA: Diagnosis not present

## 2020-07-23 DIAGNOSIS — C8331 Diffuse large B-cell lymphoma, lymph nodes of head, face, and neck: Secondary | ICD-10-CM

## 2020-07-23 DIAGNOSIS — Z6829 Body mass index (BMI) 29.0-29.9, adult: Secondary | ICD-10-CM | POA: Diagnosis not present

## 2020-07-23 DIAGNOSIS — E669 Obesity, unspecified: Secondary | ICD-10-CM | POA: Insufficient documentation

## 2020-07-23 DIAGNOSIS — Z6826 Body mass index (BMI) 26.0-26.9, adult: Secondary | ICD-10-CM | POA: Insufficient documentation

## 2020-07-23 NOTE — Progress Notes (Signed)
BP 108/74    Pulse 70    Temp 97.8 F (36.6 C) (Oral)    Ht 5\' 6"  (1.676 m)    Wt 183 lb 3.2 oz (83.1 kg)    LMP  (LMP Unknown)    SpO2 97%    BMI 29.57 kg/m    Subjective:    Patient ID: Christina [name], female    DOB: Jul 30, 1961, 59 y.o.   MRN: [id number]  HPI: Christina [name] is a 59 y.o. female  Chief Complaint  Patient presents with   Weight Loss    trying to lose weight, has been excercising, watch what and how much she is eating.     WEIGHT LOSS: Presents today to discuss weight loss, has been attempting on own with exercise and monitoring food intake.  Has been going to the gym twice a week and walking every night, walk [facility] 5 times.  Eating lots of chicken and greens, occasionally has one baked potato a week.  Salads 4 times a week.  Cutting back on carbohydrates and sugar heavy meals.  Drinking lots of lemon water.  Has underlying hypothyroid -- TSH in October was 3.440.  She would like to gradually lose 40 pounds, that is her goal.  Does want to cover herself constantly.  She does have increased stressors in life at this time, with wedding planing and two businesses.  Is interested in medication to help with weight loss.  Followed by oncology for B Cell Lymphoma, treated in 2018.  Had recent visit with them, no changes made.  Relevant past medical, surgical, family and social history reviewed and updated as indicated. Interim medical history since our last visit reviewed. Allergies and medications reviewed and updated.  Review of Systems  Constitutional: Negative for activity change, appetite change, diaphoresis, fatigue and fever.  Respiratory: Negative for cough, chest tightness, shortness of breath and wheezing.   Cardiovascular: Negative for chest pain, palpitations and leg swelling.  Gastrointestinal: Negative.   Endocrine: Negative for cold intolerance, heat intolerance, polydipsia, polyphagia and polyuria.  Neurological: Negative.   Psychiatric/Behavioral:  Negative.     Per HPI unless specifically indicated above     Objective:    BP 108/74    Pulse 70    Temp 97.8 F (36.6 C) (Oral)    Ht 5\' 6"  (1.676 m)    Wt 183 lb 3.2 oz (83.1 kg)    LMP  (LMP Unknown)    SpO2 97%    BMI 29.57 kg/m   Wt Readings from Last 3 Encounters:  07/23/20 183 lb 3.2 oz (83.1 kg)  05/23/20 182 lb 1.6 oz (82.6 kg)  10/18/19 185 lb (83.9 kg)    Physical Exam Vitals and nursing note reviewed.  Constitutional:      General: She is awake. She is not in acute distress.    Appearance: She is well-developed and overweight. She is not ill-appearing.  HENT:     Head: Normocephalic.     Right Ear: Hearing normal.     Left Ear: Hearing normal.  Eyes:     General: Lids are normal.        Right eye: No discharge.        Left eye: No discharge.     Conjunctiva/sclera: Conjunctivae normal.     Pupils: Pupils are equal, round, and reactive to light.  Neck:     Thyroid: No thyromegaly.     Vascular: No carotid bruit.  Cardiovascular:  Rate and Rhythm: Normal rate and regular rhythm.     Heart sounds: Normal heart sounds. No murmur heard.  No gallop.   Pulmonary:     Effort: Pulmonary effort is normal. No accessory muscle usage or respiratory distress.     Breath sounds: Normal breath sounds.  Abdominal:     General: Bowel sounds are normal.     Palpations: Abdomen is soft.  Musculoskeletal:     Cervical back: Normal range of motion and neck supple.     Right lower leg: No edema.     Left lower leg: No edema.  Lymphadenopathy:     Cervical: No cervical adenopathy.  Skin:    General: Skin is warm and dry.  Neurological:     Mental Status: She is alert and oriented to person, place, and time.  Psychiatric:        Attention and Perception: Attention normal.        Mood and Affect: Mood normal.        Behavior: Behavior normal. Behavior is cooperative.        Thought Content: Thought content normal.        Judgment: Judgment normal.     Results for  orders placed or performed in visit on 07/08/20  HM MAMMOGRAPHY  Result Value Ref Range   HM Mammogram 0-4 Bi-Rad 0-4 Bi-Rad, Self Reported Normal      Assessment & Plan:   Problem List Items Addressed This Visit      Endocrine   Hypothyroid    Chronic, ongoing.  Continue current medication regimen and adjust as needed based on labs.  Thyroid labs today.      Relevant Orders   TSH   T4, free     Immune and Lymphatic   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[place]) - Primary    Continue collaboration with oncology, recent note reviewed.        Other   BMI 29.0-29.9,adult    Has been working towards weight loss with no success.  Goal is 40 pounds of weight loss.  Will place referral to weight management provider for further recommendations, discussed with her.  Recommended eating smaller high protein, low fat meals more frequently and exercising 30 mins a day 5 times a week with a goal of 10-15lb weight loss in the next 3 months. Patient voiced their understanding and motivation to adhere to these recommendations.  Discussed various herbal supplements can take OTC for stress and sleep, she wishes to avoid prescription medication for stress at this time.  A1C and BMP today.      Relevant Orders   Basic metabolic panel   HgB O3J   Amb Ref to Medical Weight Management       Follow up plan: Return in about 6 months (around 01/20/2021) for Annual physical.

## 2020-07-23 NOTE — Assessment & Plan Note (Signed)
Has been working towards weight loss with no success.  Goal is 40 pounds of weight loss.  Will place referral to weight management provider for further recommendations, discussed with her.  Recommended eating smaller high protein, low fat meals more frequently and exercising 30 mins a day 5 times a week with a goal of 10-15lb weight loss in the next 3 months. Patient voiced their understanding and motivation to adhere to these recommendations.  Discussed various herbal supplements can take OTC for stress and sleep, she wishes to avoid prescription medication for stress at this time.  A1C and BMP today.

## 2020-07-23 NOTE — Assessment & Plan Note (Signed)
Chronic, ongoing.  Continue current medication regimen and adjust as needed based on labs.  Thyroid labs today. 

## 2020-07-23 NOTE — Assessment & Plan Note (Signed)
Continue collaboration with oncology, recent note reviewed. 

## 2020-07-23 NOTE — Patient Instructions (Signed)
Healthy Eating Following a healthy eating pattern may help you to achieve and maintain a healthy body weight, reduce the risk of chronic disease, and live a long and productive life. It is important to follow a healthy eating pattern at an appropriate calorie level for your body. Your nutritional needs should be met primarily through food by choosing a variety of nutrient-rich foods. What are tips for following this plan? Reading food labels  Read labels and choose the following: ? Reduced or low sodium. ? Juices with 100% fruit juice. ? Foods with low saturated fats and high polyunsaturated and monounsaturated fats. ? Foods with whole grains, such as whole wheat, cracked wheat, brown rice, and wild rice. ? Whole grains that are fortified with folic acid. This is recommended for women who are pregnant or who want to become pregnant.  Read labels and avoid the following: ? Foods with a lot of added sugars. These include foods that contain brown sugar, corn sweetener, corn syrup, dextrose, fructose, glucose, high-fructose corn syrup, honey, invert sugar, lactose, malt syrup, maltose, molasses, raw sugar, sucrose, trehalose, or turbinado sugar.  Do not eat more than the following amounts of added sugar per day:  6 teaspoons (25 g) for women.  9 teaspoons (38 g) for men. ? Foods that contain processed or refined starches and grains. ? Refined grain products, such as white flour, degermed cornmeal, white bread, and white rice. Shopping  Choose nutrient-rich snacks, such as vegetables, whole fruits, and nuts. Avoid high-calorie and high-sugar snacks, such as potato chips, fruit snacks, and candy.  Use oil-based dressings and spreads on foods instead of solid fats such as butter, stick margarine, or cream cheese.  Limit pre-made sauces, mixes, and "instant" products such as flavored rice, instant noodles, and ready-made pasta.  Try more plant-protein sources, such as tofu, tempeh, black beans,  edamame, lentils, nuts, and seeds.  Explore eating plans such as the Mediterranean diet or vegetarian diet. Cooking  Use oil to saut or stir-fry foods instead of solid fats such as butter, stick margarine, or lard.  Try baking, boiling, grilling, or broiling instead of frying.  Remove the fatty part of meats before cooking.  Steam vegetables in water or broth. Meal planning   At meals, imagine dividing your plate into fourths: ? One-half of your plate is fruits and vegetables. ? One-fourth of your plate is whole grains. ? One-fourth of your plate is protein, especially lean meats, poultry, eggs, tofu, beans, or nuts.  Include low-fat dairy as part of your daily diet. Lifestyle  Choose healthy options in all settings, including home, work, school, restaurants, or stores.  Prepare your food safely: ? Wash your hands after handling raw meats. ? Keep food preparation surfaces clean by regularly washing with hot, soapy water. ? Keep raw meats separate from ready-to-eat foods, such as fruits and vegetables. ? Cook seafood, meat, poultry, and eggs to the recommended internal temperature. ? Store foods at safe temperatures. In general:  Keep cold foods at 59F (4.4C) or below.  Keep hot foods at 159F (60C) or above.  Keep your freezer at [facility] (-17.8C) or below.  Foods are no longer safe to eat when they have been between the temperatures of 40-159F (4.4-60C) for more than 2 hours. What foods should I eat? Fruits Aim to eat 2 cup-equivalents of fresh, canned (in natural juice), or frozen fruits each day. Examples of 1 cup-equivalent of fruit include 1 small apple, 8 large strawberries, 1 cup canned fruit,  cup  dried fruit, or 1 cup 100% juice. Vegetables Aim to eat 2-3 cup-equivalents of fresh and frozen vegetables each day, including different varieties and colors. Examples of 1 cup-equivalent of vegetables include 2 medium carrots, 2 cups raw, leafy greens, 1 cup chopped  vegetable (raw or cooked), or 1 medium baked potato. Grains Aim to eat 6 ounce-equivalents of whole grains each day. Examples of 1 ounce-equivalent of grains include 1 slice of bread, 1 cup ready-to-eat cereal, 3 cups popcorn, or  cup cooked rice, pasta, or cereal. Meats and other proteins Aim to eat 5-6 ounce-equivalents of protein each day. Examples of 1 ounce-equivalent of protein include 1 egg, 1/2 cup nuts or seeds, or 1 tablespoon (16 g) peanut butter. A cut of meat or fish that is the size of a deck of cards is about 3-4 ounce-equivalents.  Of the protein you eat each week, try to have at least 8 ounces come from seafood. This includes salmon, trout, herring, and anchovies. Dairy Aim to eat 3 cup-equivalents of fat-free or low-fat dairy each day. Examples of 1 cup-equivalent of dairy include 1 cup (240 mL) milk, 8 ounces (250 g) yogurt, 1 ounces (44 g) natural cheese, or 1 cup (240 mL) fortified soy milk. Fats and oils  Aim for about 5 teaspoons (21 g) per day. Choose monounsaturated fats, such as canola and olive oils, avocados, peanut butter, and most nuts, or polyunsaturated fats, such as sunflower, corn, and soybean oils, walnuts, pine nuts, sesame seeds, sunflower seeds, and flaxseed. Beverages  Aim for six 8-oz glasses of water per day. Limit coffee to three to five 8-oz cups per day.  Limit caffeinated beverages that have added calories, such as soda and energy drinks.  Limit alcohol intake to no more than 1 drink a day for nonpregnant women and 2 drinks a day for men. One drink equals 12 oz of beer (355 mL), 5 oz of wine (148 mL), or 1 oz of hard liquor (44 mL). Seasoning and other foods  Avoid adding excess amounts of salt to your foods. Try flavoring foods with herbs and spices instead of salt.  Avoid adding sugar to foods.  Try using oil-based dressings, sauces, and spreads instead of solid fats. This information is based on general [place] nutrition guidelines. For more  information, visit [employer].es. Exact amounts may vary based on your nutrition needs. Summary  A healthy eating plan may help you to maintain a healthy weight, reduce the risk of chronic diseases, and stay active throughout your life.  Plan your meals. Make sure you eat the right portions of a variety of nutrient-rich foods.  Try baking, boiling, grilling, or broiling instead of frying.  Choose healthy options in all settings, including home, work, school, restaurants, or stores. This information is not intended to replace advice given to you by your health care provider. Make sure you discuss any questions you have with your health care provider. Document Revised: 12/19/2017 Document Reviewed: 12/19/2017 Elsevier Patient Education  [place].

## 2020-07-24 LAB — BASIC METABOLIC PANEL
BUN/Creatinine Ratio: 17 (ref 9–23)
BUN: 14 mg/dL (ref 6–24)
CO2: 22 mmol/L (ref 20–29)
Calcium: 10.1 mg/dL (ref 8.7–10.2)
Chloride: 105 mmol/L (ref 96–106)
Creatinine, Ser: 0.82 mg/dL (ref 0.57–1.00)
GFR calc Af Amer: 91 mL/min/{1.73_m2} (ref >59)
GFR calc non Af Amer: 79 mL/min/{1.73_m2} (ref >59)
Glucose: 84 mg/dL (ref 65–99)
Potassium: 4.4 mmol/L (ref 3.5–5.2)
Sodium: 141 mmol/L (ref 134–144)

## 2020-07-24 LAB — HEMOGLOBIN A1C
Est. average glucose Bld gHb Est-mCnc: 111 mg/dL
Hgb A1c MFr Bld: 5.5 % (ref 4.8–5.6)

## 2020-07-24 LAB — TSH: TSH: 3.6 u[IU]/mL (ref 0.450–4.500)

## 2020-07-24 LAB — T4, FREE: Free T4: 1.21 ng/dL (ref 0.82–1.77)

## 2020-07-24 NOTE — Progress Notes (Signed)
Contacted via MyChart  Good morning Christina [name], I am so grateful I was able to talk to you yesterday.  You were like the angel I needed during this time.  Thank you so much.  Your labs have returned and all are normal, include no diabetes or prediabetes.  Your thyroid labs are normal and can continue current dose of medication.  Let me know if you do not hear from Dr. Tonette Bihari office over next week.  I am here for you anytime.  Thank you again. Keep being awesome!!  Thank you for allowing me to participate in your care. Kindest regards, Shivaun Bilello

## 2020-08-18 ENCOUNTER — Encounter: Payer: Self-pay | Admitting: Oncology

## 2020-08-19 ENCOUNTER — Telehealth: Payer: Self-pay | Admitting: *Deleted

## 2020-08-19 ENCOUNTER — Other Ambulatory Visit: Payer: Self-pay

## 2020-08-19 ENCOUNTER — Telehealth: Payer: Self-pay | Admitting: Oncology

## 2020-08-19 ENCOUNTER — Encounter: Payer: Self-pay | Admitting: Oncology

## 2020-08-19 DIAGNOSIS — C8331 Diffuse large B-cell lymphoma, lymph nodes of head, face, and neck: Secondary | ICD-10-CM

## 2020-08-19 NOTE — Telephone Encounter (Signed)
Patient called stating that she has a "knot where my throat and neck meet" on further questioning she reports that it is tender when palpating the area which is just below her previous lymphoma site and off to the right side. When asked the size of the knot, she was unable to say size and said "well it just hurts when I mash on it, I think it is my Lymph node there"She denies fevers or night sweats, but does report hot flashes at night. She is asking for her appointment to be moved up so that she can be evaluated for this. Please advise

## 2020-08-19 NOTE — Telephone Encounter (Signed)
08/19/2020 Returned pt call; she left VM saying she is currently having pain and issues with her lymph nodes. Transferred her to the triage nurse and will follow up w/ Dr. Janese Banks and team about scheduling her for an appt  SRW

## 2020-08-19 NOTE — Telephone Encounter (Signed)
Can we get US soft tissue neck first before we see her?

## 2020-08-19 NOTE — Telephone Encounter (Signed)
We will get US soft tissue neck scheduled first and I will see her following that on thursday

## 2020-08-20 ENCOUNTER — Other Ambulatory Visit: Payer: Self-pay

## 2020-08-20 ENCOUNTER — Ambulatory Visit
Admission: RE | Admit: 2020-08-20 | Discharge: 2020-08-20 | Disposition: A | Discharge status: Home / Self Care | Payer: 59 | Source: Ambulatory Visit | Attending: Oncology | Admitting: Oncology

## 2020-08-20 ENCOUNTER — Encounter: Payer: Self-pay | Admitting: Oncology

## 2020-08-20 DIAGNOSIS — C8331 Diffuse large B-cell lymphoma, lymph nodes of head, face, and neck: Secondary | ICD-10-CM | POA: Insufficient documentation

## 2020-08-21 ENCOUNTER — Inpatient Hospital Stay: Payer: 59 | Attending: Oncology | Admitting: Oncology

## 2020-08-21 ENCOUNTER — Other Ambulatory Visit: Payer: Self-pay | Admitting: *Deleted

## 2020-08-21 DIAGNOSIS — R591 Generalized enlarged lymph nodes: Secondary | ICD-10-CM | POA: Diagnosis not present

## 2020-08-21 DIAGNOSIS — E039 Hypothyroidism, unspecified: Secondary | ICD-10-CM | POA: Insufficient documentation

## 2020-08-21 DIAGNOSIS — Z9049 Acquired absence of other specified parts of digestive tract: Secondary | ICD-10-CM | POA: Insufficient documentation

## 2020-08-21 DIAGNOSIS — Z7952 Long term (current) use of systemic steroids: Secondary | ICD-10-CM | POA: Diagnosis not present

## 2020-08-21 DIAGNOSIS — Z8249 Family history of ischemic heart disease and other diseases of the circulatory system: Secondary | ICD-10-CM | POA: Diagnosis not present

## 2020-08-21 DIAGNOSIS — M542 Cervicalgia: Secondary | ICD-10-CM | POA: Diagnosis not present

## 2020-08-21 DIAGNOSIS — Z801 Family history of malignant neoplasm of trachea, bronchus and lung: Secondary | ICD-10-CM | POA: Diagnosis not present

## 2020-08-21 DIAGNOSIS — Z803 Family history of malignant neoplasm of breast: Secondary | ICD-10-CM | POA: Insufficient documentation

## 2020-08-21 DIAGNOSIS — Z79899 Other long term (current) drug therapy: Secondary | ICD-10-CM | POA: Insufficient documentation

## 2020-08-21 DIAGNOSIS — C8338 Diffuse large B-cell lymphoma, lymph nodes of multiple sites: Secondary | ICD-10-CM | POA: Diagnosis not present

## 2020-08-21 MED ORDER — PREDNISONE 10 MG (21) PO TBPK: ORAL_TABLET | ORAL | 0 refills | Status: DC

## 2020-08-26 NOTE — Progress Notes (Signed)
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 Patient is a59 year old female who presented to her primary care doctor with symptoms of right neck swelling in January 2018. Her symptoms of an ongoing since December 2017. Patient works at a [employer] (medial in the swelling but the swelling did not go away. She was then referred to ENT.   2. CT of the soft tissue neck with contrast showed malignant right leg lymphadenopathy with heterogeneous enhancement and extracapsular extension occupying both the right level II and level III nodal stations. Individually B abnormal lymph nodes measure up to 4.2 cm in largest dimension and the conglomerulationof abnormal lymph nodes is 5-5.5 cm across. There is a small but asymmetric and conspicuity 7 mm lymph node along the lower right 3-Bstation. Mass effect on the right carotid space from the abnormal nodes but the major vascular structures in the neck at the skull base including the right IJ remained patent.  3. 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 Cells were CD20 positive and BCL 6 and partially dim BCL-2 positive. B cells were negative for C myc (10%). Mom one was positive in 50% of the cells. Ki-67 was 50%.Bone marrow biopsy was negative for lymphoma involvement  4. PET/CT showed IMPRESSION: 1. FDG avid malignancy is identified in the level 2 and 3  nodal stations of the right cervical lymph node chain, consistent with the patient's known lymphoma. 2. Focal uptake in the gastric cardia is suspicious for malignancy as well. Recommend direct visualization and biopsy as clinically Warranted.  5. EGD showed diffuse moderately erythematous mucosa without bleeding initial nonbleeding gastric ulcer 6 mm which was biopsied. Findings were consistent with H. pylori gastritis and patient was started on antibiotictherapy for the same. Repeat H pylori testing negative  6. EUS also showed H pylori. There were few B cells noted in lamina propria and clonality studies have been ordered. Initial testing for B cell clonality showed no monoclonal B-cell population. Confirmation testing also did not reveal clonal B cells  7. PET/CT scan after cycles of RCHOP showed: IMPRESSION: 1. Complete metabolic response. No metabolically active lymphoma. 2. Uniform low-level marrow hypermetabolism throughout the axial skeleton, compatible with reactive marrow state due to chemotherapy. 3. No residual gastric hypermetabolism  8. She completed3 cycles of RCHOP between feb 2018-aoril 2018 followed byIFRTto her neck in June 2018   Interval history-patient reports having right-sided neck pain for the last 1 week.  She reports pain when she moves her neck as well as pain when she swallows.  Denies any neck trauma.  ECOG PS- 0 Pain scale- 3  Review of systems- Review of Systems  Constitutional: Negative for chills, fever, malaise/fatigue and weight loss.  HENT: Negative for congestion, ear discharge and nosebleeds.        Neck pain  Eyes: Negative for blurred vision.  Respiratory: Negative for cough, hemoptysis, sputum production, shortness of breath and wheezing.   Cardiovascular: Negative for  chest pain, palpitations, orthopnea and claudication.  Gastrointestinal: Negative for abdominal pain, blood in stool, constipation, diarrhea, heartburn, melena,  nausea and vomiting.  Genitourinary: Negative for dysuria, flank pain, frequency, hematuria and urgency.  Musculoskeletal: Negative for back pain, joint pain and myalgias.  Skin: Negative for rash.  Neurological: Negative for dizziness, tingling, focal weakness, seizures, weakness and headaches.  Endo/Heme/Allergies: Does not bruise/bleed easily.  Psychiatric/Behavioral: Negative for depression and suicidal ideas. The patient does not have insomnia.       No Known Allergies   Past Medical History:  Diagnosis Date  . B-cell lymphoma ([place])   . Cancer ([place]) 2018   lymphoma, non hodgekins B cell  . Chest pain 12/08/2016  . Chest pain 12/08/2016  . Dehydration 12/08/2016  . GERD (gastroesophageal reflux disease)   . Hypothyroidism      Past Surgical History:  Procedure Laterality Date  . ABDOMINAL HYSTERECTOMY    . [place]   x 2   . CHOLECYSTECTOMY N/A 10/04/2019   Procedure: LAPAROSCOPIC CHOLECYSTECTOMY;  Surgeon: Olean Ree, MD;  Location: [facility] ORS;  Service: General;  Laterality: N/A;  . COLONOSCOPY WITH PROPOFOL N/A 09/28/2017   Procedure: COLONOSCOPY WITH PROPOFOL;  Surgeon: Jonathon Bellows, MD;  Location: [facility] ENDOSCOPY;  Service: Gastroenterology;  Laterality: N/A;  . ESOPHAGOGASTRODUODENOSCOPY (EGD) WITH PROPOFOL N/A 11/11/2016   Procedure: ESOPHAGOGASTRODUODENOSCOPY (EGD) WITH PROPOFOL;  Surgeon: Jonathon Bellows, MD;  Location: [facility] ENDOSCOPY;  Service: Endoscopy;  Laterality: N/A;  . IR GENERIC HISTORICAL  11/03/2016   IR FLUORO GUIDE PORT INSERTION RIGHT 11/03/2016 Aletta Edouard, MD [facility]-INTERV RAD  . IR REMOVAL TUN ACCESS W/ PORT W/O FL MOD SED  02/15/2017  . REFRACTIVE SURGERY  09/2015    Social History   Socioeconomic History  . Marital status: Single    Spouse name: Not on file  . Number of children: Not on file  . Years of education: Not on file  . Highest education level: Not on file  Occupational History  . Not on file  Tobacco Use  . Smoking  status: Never Smoker  . Smokeless tobacco: Never Used  Vaping Use  . Vaping Use: Never used  Substance and Sexual Activity  . Alcohol use: No  . Drug use: No  . Sexual activity: Yes  Other Topics Concern  . Not on file  Social History Narrative  . Not on file   Social Determinants of Health   Financial Resource Strain:   . Difficulty of Paying Living Expenses: Not on file  Food Insecurity:   . Worried About Charity fundraiser in the Last Year: Not on file  . Ran Out of Food in the Last Year: Not on file  Transportation Needs:   . Lack of Transportation (Medical): Not on file  . Lack of Transportation (Non-Medical): Not on file  Physical Activity:   . Days of Exercise per Week: Not on file  . Minutes of Exercise per Session: Not on file  Stress:   . Feeling of Stress : Not on file  Social Connections:   . Frequency of Communication with Friends and Family: Not on file  . Frequency of Social Gatherings with Friends and Family: Not on file  . Attends Religious Services: Not on file  . Active Member of Clubs or Organizations: Not on file  . Attends Archivist Meetings: Not on file  . Marital Status: Not on file  Intimate Partner Violence:   . Fear of Current or Ex-Partner:  Not on file  . Emotionally Abused: Not on file  . Physically Abused: Not on file  . Sexually Abused: Not on file    Family History  Problem Relation Age of Onset  . Cancer Mother        breast  . Cancer Father        lung  . Heart disease Brother 68       MI  . Heart disease Paternal Grandfather        MI     Current Outpatient Medications:  .  gabapentin (NEURONTIN) 100 MG capsule, Take 1 capsule (100 mg total) by mouth at bedtime., Disp: 90 capsule, Rfl: 3 .  levothyroxine (SYNTHROID) 50 MCG tablet, TAKE 1 TABLET BY MOUTH EVERY DAY, Disp: 90 tablet, Rfl: 1 .  predniSONE (STERAPRED UNI-PAK 21 TAB) 10 MG (21) TBPK tablet, Start with 6 tablets and every day decrease by 1 tablet until  pills are gone. Take with food, Disp: 21 tablet, Rfl: 0  Physical exam: There were no vitals filed for this visit. Physical Exam Constitutional:      General: She is not in acute distress. HENT:     Mouth/Throat:     Mouth: Mucous membranes are moist.     Pharynx: Oropharynx is clear.  Cardiovascular:     Rate and Rhythm: Normal rate and regular rhythm.     Heart sounds: Normal heart sounds.  Pulmonary:     Effort: Pulmonary effort is normal.     Breath sounds: Normal breath sounds.  Abdominal:     General: Bowel sounds are normal.     Palpations: Abdomen is soft.  Lymphadenopathy:     Comments: No palpable cervical adenopathy  Skin:    General: Skin is warm and dry.  Neurological:     Mental Status: She is alert and oriented to person, place, and time.      CMP Latest Ref Rng & Units 07/23/2020  Glucose 65 - 99 mg/dL 84  BUN 6 - 24 mg/dL 14  Creatinine 0.57 - 1.00 mg/dL 0.82  Sodium 134 - 144 mmol/L 141  Potassium 3.5 - 5.2 mmol/L 4.4  Chloride 96 - 106 mmol/L 105  CO2 20 - 29 mmol/L 22  Calcium 8.7 - 10.2 mg/dL 10.1  Total Protein 6.5 - 8.1 g/dL -  Total Bilirubin 0.3 - 1.2 mg/dL -  Alkaline Phos 38 - 126 U/L -  AST 15 - 41 U/L -  ALT 0 - 44 U/L -   CBC Latest Ref Rng & Units 05/23/2020  WBC 4.0 - 10.5 K/uL 7.1  Hemoglobin 12.0 - 15.0 g/dL 13.9  Hematocrit 36 - 46 % 40.9  Platelets 150 - 400 K/uL 257    No images are attached to the encounter.  US Soft Tissue Head/Neck  Result Date: 08/20/2020 CLINICAL DATA:  History of diffuse large B-cell lymphoma. Evaluate for cervical lymphadenopathy. EXAM: ULTRASOUND OF HEAD/NECK SOFT TISSUES TECHNIQUE: Ultrasound examination of the head and neck soft tissues was performed in the area of clinical concern. COMPARISON:  PET-CT-01/06/2017; 11/08/2016 FINDINGS: Sonographic evaluation of patient's area of discomfort involving the right neck correlates with a non pathologically enlarged cervical lymph node. The lymph node is not  enlarged by size criteria measuring 0.5 cm in greatest short axis diameter. Otherwise, there is no sonographic correlate for patient's palpable area of concern. Specifically, no discrete solid or cystic lesions. Adjacent cervical vasculature appears of normal caliber and widely patent. IMPRESSION: Patient's palpable area of concern within  the right lateral neck correlates with a non pathologically enlarged cervical lymph node, presumably reactive in etiology, however given clinical history of large B-cell lymphoma (previously involving the right-side of the neck), clinical correlation to resolution is advised. Electronically Signed   By: Sandi Mariscal M.D.   On: 08/20/2020 10:19     Assessment and plan- Patient is a 59 y.o. 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  This is an acute visit for neck pain  Patient has had pain in her right side of the neck right next to her trachea especially on neck movement and swallowing.  On exam I do not palpate any adenopathy or swelling.  Ultrasound of the neck also did not show any Pathological adenopathy.  She had a right cervical lymph node which was 0.5 cm and not enlarged by size criteria.  It is unclear as to what is causing her pain.  I will give her a short course of steroids for a week to see if her pain gets better.  If it does not I will refer her to ENT.  She will call us and let us know how she is doing.  She will keep follow-up appointments with me in the future as scheduled   Visit Diagnosis 1. Neck pain      Dr. Randa Evens, MD, MPH [facility] [phone] 08/26/2020 12:52 PM

## 2020-09-02 ENCOUNTER — Other Ambulatory Visit: Payer: Self-pay | Admitting: Nurse Practitioner

## 2020-09-05 ENCOUNTER — Other Ambulatory Visit: Payer: Self-pay | Admitting: Nurse Practitioner

## 2020-11-06 ENCOUNTER — Telehealth: Payer: Self-pay | Admitting: Oncology

## 2020-11-06 NOTE — Telephone Encounter (Signed)
Called patient to r/s her 3/4 appts (MD on PAL). Appt moved to 3/22. Mailing updated AVS.

## 2020-11-21 ENCOUNTER — Other Ambulatory Visit: Payer: 59

## 2020-11-21 ENCOUNTER — Ambulatory Visit: Payer: 59 | Admitting: Oncology

## 2020-12-09 ENCOUNTER — Encounter: Payer: Self-pay | Admitting: Oncology

## 2020-12-09 ENCOUNTER — Inpatient Hospital Stay: Payer: 59 | Attending: Oncology

## 2020-12-09 ENCOUNTER — Inpatient Hospital Stay (HOSPITAL_BASED_OUTPATIENT_CLINIC_OR_DEPARTMENT_OTHER): Payer: 59 | Admitting: Oncology

## 2020-12-09 VITALS — BP 115/86 | HR 87 | Temp 97.5°F | Resp 16 | Wt 168.5 lb

## 2020-12-09 DIAGNOSIS — E039 Hypothyroidism, unspecified: Secondary | ICD-10-CM | POA: Insufficient documentation

## 2020-12-09 DIAGNOSIS — Z08 Encounter for follow-up examination after completed treatment for malignant neoplasm: Secondary | ICD-10-CM

## 2020-12-09 DIAGNOSIS — Z79899 Other long term (current) drug therapy: Secondary | ICD-10-CM | POA: Diagnosis not present

## 2020-12-09 DIAGNOSIS — R5383 Other fatigue: Secondary | ICD-10-CM | POA: Insufficient documentation

## 2020-12-09 DIAGNOSIS — Z9049 Acquired absence of other specified parts of digestive tract: Secondary | ICD-10-CM | POA: Insufficient documentation

## 2020-12-09 DIAGNOSIS — Z801 Family history of malignant neoplasm of trachea, bronchus and lung: Secondary | ICD-10-CM | POA: Diagnosis not present

## 2020-12-09 DIAGNOSIS — C8338 Diffuse large B-cell lymphoma, lymph nodes of multiple sites: Secondary | ICD-10-CM | POA: Diagnosis not present

## 2020-12-09 DIAGNOSIS — R591 Generalized enlarged lymph nodes: Secondary | ICD-10-CM | POA: Insufficient documentation

## 2020-12-09 DIAGNOSIS — Z803 Family history of malignant neoplasm of breast: Secondary | ICD-10-CM | POA: Diagnosis not present

## 2020-12-09 DIAGNOSIS — Z8579 Personal history of other malignant neoplasms of lymphoid, hematopoietic and related tissues: Secondary | ICD-10-CM

## 2020-12-09 DIAGNOSIS — Z8249 Family history of ischemic heart disease and other diseases of the circulatory system: Secondary | ICD-10-CM | POA: Insufficient documentation

## 2020-12-09 LAB — LACTATE DEHYDROGENASE: LDH: 140 U/L (ref 98–192)

## 2020-12-09 LAB — COMPREHENSIVE METABOLIC PANEL
ALT: 16 U/L (ref 0–44)
AST: 17 U/L (ref 15–41)
Albumin: 4.1 g/dL (ref 3.5–5.0)
Alkaline Phosphatase: 88 U/L (ref 38–126)
Anion gap: 9 (ref 5–15)
BUN: 15 mg/dL (ref 6–20)
CO2: 25 mmol/L (ref 22–32)
Calcium: 9.1 mg/dL (ref 8.9–10.3)
Chloride: 104 mmol/L (ref 98–111)
Creatinine, Ser: 0.97 mg/dL (ref 0.44–1.00)
GFR, Estimated: >60 mL/min (ref >60)
Glucose, Bld: 121 mg/dL — ABNORMAL HIGH (ref 70–99)
Potassium: 4 mmol/L (ref 3.5–5.1)
Sodium: 138 mmol/L (ref 135–145)
Total Bilirubin: 0.4 mg/dL (ref 0.3–1.2)
Total Protein: 6.9 g/dL (ref 6.5–8.1)

## 2020-12-09 LAB — CBC WITH DIFFERENTIAL/PLATELET
Abs Immature Granulocytes: 0.02 10*3/uL (ref 0.00–0.07)
Basophils Absolute: 0 10*3/uL (ref 0.0–0.1)
Basophils Relative: 0 %
Eosinophils Absolute: 0 10*3/uL (ref 0.0–0.5)
Eosinophils Relative: 0 %
HCT: 37.6 % (ref 36.0–46.0)
Hemoglobin: 12.5 g/dL (ref 12.0–15.0)
Immature Granulocytes: 0 %
Lymphocytes Relative: 28 %
Lymphs Abs: 1.8 10*3/uL (ref 0.7–4.0)
MCH: 29.5 pg (ref 26.0–34.0)
MCHC: 33.2 g/dL (ref 30.0–36.0)
MCV: 88.7 fL (ref 80.0–100.0)
Monocytes Absolute: 0.4 10*3/uL (ref 0.1–1.0)
Monocytes Relative: 7 %
Neutro Abs: 4.3 10*3/uL (ref 1.7–7.7)
Neutrophils Relative %: 65 %
Platelets: 241 10*3/uL (ref 150–400)
RBC: 4.24 MIL/uL (ref 3.87–5.11)
RDW: 12.3 % (ref 11.5–15.5)
WBC: 6.7 10*3/uL (ref 4.0–10.5)
nRBC: 0 % (ref 0.0–0.2)

## 2020-12-09 NOTE — Progress Notes (Signed)
Pt in for follow up, denies any difficulties or concerns today. 

## 2020-12-14 NOTE — Progress Notes (Signed)
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 Patient is a60 year old female who presented to her primary care doctor with symptoms of right neck swelling in January 2018. Her symptoms of an ongoing since December 2017. Patient works at a [employer] (medial in the swelling but the swelling did not go away. She was then referred to ENT.   2. CT of the soft tissue neck with contrast showed malignant right leg lymphadenopathy with heterogeneous enhancement and extracapsular extension occupying both the right level II and level III nodal stations. Individually B abnormal lymph nodes measure up to 4.2 cm in largest dimension and the conglomerulationof abnormal lymph nodes is 5-5.5 cm across. There is a small but asymmetric and conspicuity 7 mm lymph node along the lower right 3-Bstation. Mass effect on the right carotid space from the abnormal nodes but the major vascular structures in the neck at the skull base including the right IJ remained patent.  3. 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 Cells were CD20 positive and BCL 6 and partially dim BCL-2 positive. B cells were negative for C myc (10%). Mom one was positive in 50% of the cells. Ki-67 was 50%.Bone marrow biopsy was negative for lymphoma involvement  4. PET/CT showed IMPRESSION: 1. FDG avid malignancy is identified in the level 2 and  3 nodal stations of the right cervical lymph node chain, consistent with the patient's known lymphoma. 2. Focal uptake in the gastric cardia is suspicious for malignancy as well. Recommend direct visualization and biopsy as clinically Warranted.  5. EGD showed diffuse moderately erythematous mucosa without bleeding initial nonbleeding gastric ulcer 6 mm which was biopsied. Findings were consistent with H. pylori gastritis and patient was started on antibiotictherapy for the same. Repeat H pylori testing negative  6. EUS also showed H pylori. There were few B cells noted in lamina propria and clonality studies have been ordered. Initial testing for B cell clonality showed no monoclonal B-cell population. Confirmation testing also did not reveal clonal B cells  7. PET/CT scan after cycles of RCHOP showed: IMPRESSION: 1. Complete metabolic response. No metabolically active lymphoma. 2. Uniform low-level marrow hypermetabolism throughout the axial skeleton, compatible with reactive marrow state due to chemotherapy. 3. No residual gastric hypermetabolism  8. She completed3 cycles of RCHOP between feb 2018-aoril 2018 followed byIFRTto her neck in June 2018   Interval history-patient reports doing well and denies any complaints at this time.  Appetite and weight have remained stable.  Denies any lumps or bumps anywhere.  Denies any drenching night sweats.  ECOG PS- 0 Pain scale- 0   Review of systems- Review of Systems  Constitutional: Positive for malaise/fatigue. Negative for chills, fever and weight loss.  HENT: Negative for congestion, ear discharge and nosebleeds.   Eyes: Negative for blurred vision.  Respiratory: Negative for cough, hemoptysis, sputum production, shortness of breath and wheezing.   Cardiovascular: Negative for chest pain, palpitations, orthopnea and claudication.  Gastrointestinal: Negative for abdominal pain, blood in stool, constipation, diarrhea,  heartburn, melena, nausea and vomiting.  Genitourinary: Negative for dysuria, flank pain, frequency, hematuria and urgency.  Musculoskeletal: Negative for back pain, joint pain and myalgias.  Skin: Negative for rash.  Neurological: Negative for dizziness, tingling, focal weakness, seizures, weakness and headaches.  Endo/Heme/Allergies: Does not bruise/bleed easily.  Psychiatric/Behavioral: Negative for depression and suicidal ideas. The patient does not have insomnia.       No Known Allergies   Past Medical History:  Diagnosis Date  . B-cell lymphoma ([place])   . Cancer ([place]) 2018   lymphoma, non hodgekins B cell  . Chest pain 12/08/2016  . Chest pain 12/08/2016  . Dehydration 12/08/2016  . GERD (gastroesophageal reflux disease)   . Hypothyroidism      Past Surgical History:  Procedure Laterality Date  . ABDOMINAL HYSTERECTOMY    . [place]   x 2   . CHOLECYSTECTOMY N/A 10/04/2019   Procedure: LAPAROSCOPIC CHOLECYSTECTOMY;  Surgeon: Olean Ree, MD;  Location: [facility] ORS;  Service: General;  Laterality: N/A;  . COLONOSCOPY WITH PROPOFOL N/A 09/28/2017   Procedure: COLONOSCOPY WITH PROPOFOL;  Surgeon: Jonathon Bellows, MD;  Location: [facility] ENDOSCOPY;  Service: Gastroenterology;  Laterality: N/A;  . ESOPHAGOGASTRODUODENOSCOPY (EGD) WITH PROPOFOL N/A 11/11/2016   Procedure: ESOPHAGOGASTRODUODENOSCOPY (EGD) WITH PROPOFOL;  Surgeon: Jonathon Bellows, MD;  Location: [facility] ENDOSCOPY;  Service: Endoscopy;  Laterality: N/A;  . IR GENERIC HISTORICAL  11/03/2016   IR FLUORO GUIDE PORT INSERTION RIGHT 11/03/2016 Aletta Edouard, MD [facility]-INTERV RAD  . IR REMOVAL TUN ACCESS W/ PORT W/O FL MOD SED  02/15/2017  . REFRACTIVE SURGERY  09/2015    Social History   Socioeconomic History  . Marital status: Single    Spouse name: Not on file  . Number of children: Not on file  . Years of education: Not on file  . Highest education level: Not on file  Occupational History  . Not on file   Tobacco Use  . Smoking status: Never Smoker  . Smokeless tobacco: Never Used  Vaping Use  . Vaping Use: Never used  Substance and Sexual Activity  . Alcohol use: No  . Drug use: No  . Sexual activity: Yes  Other Topics Concern  . Not on file  Social History Narrative  . Not on file   Social Determinants of Health   Financial Resource Strain: Not on file  Food Insecurity: Not on file  Transportation Needs: Not on file  Physical Activity: Not on file  Stress: Not on file  Social Connections: Not on file  Intimate Partner Violence: Not on file    Family History  Problem Relation Age of Onset  . Cancer Mother        breast  . Cancer Father        lung  . Heart disease Brother 64       MI  . Heart disease Paternal Grandfather        MI     Current Outpatient Medications:  .  gabapentin (NEURONTIN) 100 MG capsule, TAKE 1 CAPSULE BY MOUTH AT BEDTIME., Disp: 90 capsule, Rfl: 1 .  levothyroxine (SYNTHROID) 50 MCG tablet, TAKE 1 TABLET BY MOUTH EVERY DAY, Disp: 90 tablet, Rfl: 1  Physical exam:  Vitals:   12/09/20 1158  BP: 115/86  Pulse: 87  Resp: 16  Temp: (!) 97.5 F (36.4 C)  TempSrc: Tympanic  SpO2: 100%  Weight: 168 lb 8 oz (76.4  kg)   Physical Exam Constitutional:      General: She is not in acute distress. Eyes:     Pupils: Pupils are equal, round, and reactive to light.  Cardiovascular:     Rate and Rhythm: Normal rate and regular rhythm.     Heart sounds: Normal heart sounds.  Pulmonary:     Effort: Pulmonary effort is normal.     Breath sounds: Normal breath sounds.  Abdominal:     General: Bowel sounds are normal.  Lymphadenopathy:     Comments: No palpable cervical, supraclavicular, axillary or inguinal adenopathy   Skin:    General: Skin is warm and dry.  Neurological:     Mental Status: She is alert and oriented to person, place, and time.      CMP Latest Ref Rng & Units 12/09/2020  Glucose 70 - 99 mg/dL 121(H)  BUN 6 - 20 mg/dL 15   Creatinine 0.44 - 1.00 mg/dL 0.97  Sodium 135 - 145 mmol/L 138  Potassium 3.5 - 5.1 mmol/L 4.0  Chloride 98 - 111 mmol/L 104  CO2 22 - 32 mmol/L 25  Calcium 8.9 - 10.3 mg/dL 9.1  Total Protein 6.5 - 8.1 g/dL 6.9  Total Bilirubin 0.3 - 1.2 mg/dL 0.4  Alkaline Phos 38 - 126 U/L 88  AST 15 - 41 U/L 17  ALT 0 - 44 U/L 16   CBC Latest Ref Rng & Units 12/09/2020  WBC 4.0 - 10.5 K/uL 6.7  Hemoglobin 12.0 - 15.0 g/dL 12.5  Hematocrit 36.0 - 46.0 % 37.6  Platelets 150 - 400 K/uL 241      Assessment and plan- Patient is a 60 y.o. 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   Clinically patient is doing well with no concerning signs and symptoms of recurrence based on today's exam.  She is now 4 years out of her initial diagnosis.  No indication for surveillance imaging at this time.  She will continue to have routine visits with me every 6 months for the next 1 year.   Visit Diagnosis 1. Encounter for follow-up surveillance of diffuse large B-cell lymphoma      Dr. Randa Evens, MD, MPH [facility] [phone] 12/14/2020 10:20 AM

## 2021-01-16 ENCOUNTER — Encounter: Payer: Self-pay | Admitting: Nurse Practitioner

## 2021-01-16 ENCOUNTER — Telehealth: Payer: Self-pay | Admitting: Unknown Physician Specialty

## 2021-01-16 ENCOUNTER — Telehealth: Payer: 59 | Admitting: Nurse Practitioner

## 2021-01-16 ENCOUNTER — Telehealth: Payer: Self-pay

## 2021-01-16 ENCOUNTER — Other Ambulatory Visit: Payer: Self-pay

## 2021-01-16 DIAGNOSIS — U071 COVID-19: Secondary | ICD-10-CM | POA: Diagnosis not present

## 2021-01-16 DIAGNOSIS — Z8616 Personal history of COVID-19: Secondary | ICD-10-CM | POA: Insufficient documentation

## 2021-01-16 MED ORDER — HYDROCOD POLST-CPM POLST ER 10-8 MG/5ML PO SUER: 5.0000 mL | Freq: Two times a day (BID) | ORAL | 0 refills | Status: DC | PRN

## 2021-01-16 MED ORDER — NIRMATRELVIR/RITONAVIR (PAXLOVID)TABLET
3.0000 | ORAL_TABLET | Freq: Two times a day (BID) | ORAL | 0 refills | Status: AC
  Filled 2021-01-16: qty 30, 5d supply, fill #0

## 2021-01-16 MED ORDER — LIDOCAINE VISCOUS HCL 2 % MT SOLN: 15.0000 mL | OROMUCOSAL | 2 refills | Status: DC | PRN

## 2021-01-16 NOTE — Telephone Encounter (Signed)
Patient was prescribed oral covid treatment Paxlovid and treatment note was reviewed. Medication has been received by Christina [name] and reviewed for appropriateness.  Drug Interactions or Dosage Adjustments Noted: Patient was prescribed Tussionex. Patient instructed not to take with Paxlovid.  Delivery Method: Pick-up  Patient contacted for counseling on 01/16/21 and verbalized understanding.   Delivery or Pick-Up Date: 01/16/21   Carolynne Edouard 01/16/2021, 12:37 PM [facility] Outpatient Pharmacist Phone# [phone]

## 2021-01-16 NOTE — Telephone Encounter (Signed)
Outpatient Oral COVID Treatment Note  I connected with Christina [name] on 01/16/2021/11:46 AM by telephone and verified that I am speaking with the correct person using two identifiers.  I discussed the limitations, risks, security, and privacy concerns of performing an evaluation and management service by telephone and the availability of in person appointments. I also discussed with the patient that there may be a patient responsible charge related to this service. The patient expressed understanding and agreed to proceed.  Patient location: home Provider location: home  Diagnosis: COVID-19 infection  Purpose of visit: Discussion of potential use of Molnupiravir or Paxlovid, a new treatment for mild to moderate COVID-19 viral infection in non-hospitalized patients.   Subjective: Patient is a 60 y.o. female who has been diagnosed with COVID 19 viral infection.  Their symptoms began on 4/26 with sinus pressure.    Past Medical History:  Diagnosis Date  . 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 Cancer ([facility]) 2018   lymphoma, non hodgekins B cell  . Chest pain 12/08/2016  . Chest pain 12/08/2016  . Dehydration 12/08/2016  . GERD (gastroesophageal reflux disease)   . Hypothyroidism     No Known Allergies   Current Outpatient Medications:  .  chlorpheniramine-HYDROcodone (TUSSIONEX PENNKINETIC ER) 10-8 MG/5ML SUER, Take 5 mLs by mouth every 12 (twelve) hours as needed for cough., Disp: 60 mL, Rfl: 0 .  gabapentin (NEURONTIN) 100 MG capsule, TAKE 1 CAPSULE BY MOUTH AT BEDTIME., Disp: 90 capsule, Rfl: 1 .  levothyroxine (SYNTHROID) 50 MCG tablet, TAKE 1 TABLET BY MOUTH EVERY DAY, Disp: 90 tablet, Rfl: 1 .  lidocaine (XYLOCAINE) 2 % solution, Use as directed 15 mLs in the mouth or throat as needed for mouth pain., Disp: 100 mL, Rfl: 2 .  phentermine (ADIPEX-P) 37.5 MG tablet, Take 37.5 mg by mouth every morning., Disp: , Rfl:   Objective: Patient appears/sounds well.  They are in no apparent distress.   Breathing is non labored.  Mood and behavior are normal.  Laboratory Data:  Recent Results (from the past 2160 hour(s))  Lactate dehydrogenase     Status: None   Collection Time: 12/09/20 11:23 AM  Result Value Ref Range   LDH 140 98 - 192 U/L    Comment: Performed at [facility], [address] [place], [place] [address]  Comprehensive metabolic panel     Status: Abnormal   Collection Time: 12/09/20 11:23 AM  Result Value Ref Range   Sodium 138 135 - 145 mmol/L   Potassium 4.0 3.5 - 5.1 mmol/L   Chloride 104 98 - 111 mmol/L   CO2 25 22 - 32 mmol/L   Glucose, Bld 121 (H) 70 - 99 mg/dL    Comment: Glucose reference range applies only to samples taken after fasting for at least 8 hours.   BUN 15 6 - 20 mg/dL   Creatinine, Ser 0.97 0.44 - 1.00 mg/dL   Calcium 9.1 8.9 - 10.3 mg/dL   Total Protein 6.9 6.5 - 8.1 g/dL   Albumin 4.1 3.5 - 5.0 g/dL   AST 17 15 - 41 U/L   ALT 16 0 - 44 U/L   Alkaline Phosphatase 88 38 - 126 U/L   Total Bilirubin 0.4 0.3 - 1.2 mg/dL   GFR, Estimated >60 >60 mL/min    Comment: (NOTE) Calculated using the CKD-EPI Creatinine Equation (2021)    Anion gap 9 5 - 15    Comment: Performed at [facility], [address] [place],  [place] [address]  CBC with Differential/Platelet     Status: None   Collection Time: 12/09/20 11:23 AM  Result Value Ref Range   WBC 6.7 4.0 - 10.5 K/uL   RBC 4.24 3.87 - 5.11 MIL/uL   Hemoglobin 12.5 12.0 - 15.0 g/dL   HCT 37.6 36.0 - 46.0 %   MCV 88.7 80.0 - 100.0 fL   MCH 29.5 26.0 - 34.0 pg   MCHC 33.2 30.0 - 36.0 g/dL   RDW 12.3 11.5 - 15.5 %   Platelets 241 150 - 400 K/uL   nRBC 0.0 0.0 - 0.2 %   Neutrophils Relative % 65 %   Neutro Abs 4.3 1.7 - 7.7 K/uL   Lymphocytes Relative 28 %   Lymphs Abs 1.8 0.7 - 4.0 K/uL   Monocytes Relative 7 %   Monocytes Absolute 0.4 0.1 - 1.0 K/uL   Eosinophils Relative 0 %   Eosinophils Absolute 0.0 0.0 - 0.5 K/uL   Basophils Relative 0 %   Basophils Absolute 0.0 0.0  - 0.1 K/uL   Immature Granulocytes 0 %   Abs Immature Granulocytes 0.02 0.00 - 0.07 K/uL    Comment: Performed at [facility], [address] [place],  [address]     Assessment: 60 y.o. female with mild/moderate COVID 19 viral infection diagnosed on 4/26 at high risk for progression to severe COVID 19.  Plan:  This patient is a 60 y.o. female that meets the following criteria for Emergency Use Authorization of: Paxlovid 1. Age >12 yr AND > 40 kg 2. SARS-COV-2 positive test 3. Symptom onset < 5 days 4. Mild-to-moderate COVID disease with high risk for severe progression to hospitalization or death  I have spoken and communicated the following to the patient or parent/caregiver regarding: 1. Paxlovid is an unapproved drug that is authorized for use under an Emergency Use Authorization.  2. There are no adequate, approved, available products for the treatment of COVID-19 in adults who have mild-to-moderate COVID-19 and are at high risk for progressing to severe COVID-19, including hospitalization or death. 3. Other therapeutics are currently authorized. For additional information on all products authorized for treatment or prevention of COVID-19, please see [employer].  4. There are benefits and risks of taking this treatment as outlined in the "Fact Sheet for Patients and Caregivers."  5. "Fact Sheet for Patients and Caregivers" was reviewed with patient. A hard copy will be provided to patient from pharmacy prior to the patient receiving treatment. 6. Patients should continue to self-isolate and use infection control measures (e.g., wear mask, isolate, social distance, avoid sharing personal items, clean and disinfect "high touch" surfaces, and frequent handwashing) according to CDC guidelines.  7. The patient or parent/caregiver has the option to accept or refuse  treatment. 8. Patient medication history was reviewed for potential drug interactions:No drug interactions 9. Patient's GFR was calculated to be , and they were therefore prescribed Normal dose (GFR>60) - nirmatrelvir 150mg  tab (2 tablet) by mouth twice daily AND ritonavir 100mg  tab (1 tablet) by mouth twice daily   After reviewing above information with the patient, the patient agrees to receive Paxlovid.  Follow up instructions:    . Take prescription BID x 5 days as directed . Reach out to pharmacist for counseling on medication if desired . For concerns regarding further COVID symptoms please follow up with your PCP or urgent care . For urgent or life-threatening issues, seek care at your local emergency department  The patient was provided  an opportunity to ask questions, and all were answered. The patient agreed with the plan and demonstrated an understanding of the instructions.   Script sent to [facility] and opted to pick up RX.  The patient was advised to call their PCP or seek an in-person evaluation if the symptoms worsen or if the condition fails to improve as anticipated.   I provided 20 minutes of non face-to-face telephone visit time during this encounter, and > 50% was spent counseling as documented under my assessment & plan.  [name], NP 01/16/2021 /11:46 AM  Hold Tussionex

## 2021-01-16 NOTE — Assessment & Plan Note (Signed)
Acute with positive home test today and symptoms starting 01/13/21.  Will place referral to Covid treatment team for assistance with treatment regimen for patient.  At this time will defer Prednisone treatment due possible Covid treatment.  Script for Tussionex and Viscous Lidocaine sent in.  Recommend patient self quarantine at this time for a total of 10 days.  Will obtain testing at office today since she does not have documentation of her home test.  Plan on follow-up in 2 weeks.  Sooner if any worsening symptoms.

## 2021-01-16 NOTE — Patient Instructions (Signed)

## 2021-01-16 NOTE — Progress Notes (Signed)
LMP  (LMP Unknown)    Subjective:    Patient ID: Christina [name], female    DOB: 1961/04/07, 60 y.o.   MRN: [id number]  HPI: Christina [name] is a 60 y.o. female  Chief Complaint  Patient presents with  . Sore Throat    Patient states she became symptomatic on Tuesday. Patient denies having any fever and states she took at home covid test this morning and tested positive. Patient states she would have to come in soon due to her going out of [place] in [place] and would need to be re-tested.   . Ear Pain  . Difficulty Swallowing    . This visit was completed via video visit through MyChart due to the restrictions of the COVID-19 pandemic. All issues as above were discussed and addressed. Physical exam was done as above through visual confirmation on video through MyChart. If it was felt that the patient should be evaluated in the office, they were directed there. The patient verbally consented to this visit. . Location of the patient: home . Location of the provider: work . Those involved with this call:  . Provider: Marnee Guarneri, DNP . CMA: Irena Reichmann, CMA . Front Desk/Registration: Roe Rutherford  . Time spent on call: 21 minutes with patient face to face via video conference. More than 50% of this time was spent in counseling and coordination of care. 15 minutes total spent in review of patient's record and preparation of their chart.   COVID POSITIVE  Tested positive this morning at home (did not take picture or keep test) -- reports no taste or smell.  Not running a fever. Symptoms started on Tuesday 01/13/21.  Had one episode of passing on Thursday morning.   Fever: no Cough: yes Shortness of breath: no Wheezing: no Chest pain: no Chest tightness: no Chest congestion: no Nasal congestion: yes Runny nose: yes Post nasal drip: yes Sneezing: no Sore throat: yes Swollen glands: no Sinus pressure: yes Headache: yes Face pain: no Toothache: no Ear pain: yes  "right Ear pressure: yes "right Eyes red/itching:no Eye drainage/crusting: no  Vomiting: no Rash: no Fatigue: yes Sick contacts: no Strep contacts: no  Context: stable Recurrent sinusitis: no Relief with OTC cold/cough medications: yes  Treatments attempted: cold/sinus and mucinex   Relevant past medical, surgical, family and social history reviewed and updated as indicated. Interim medical history since our last visit reviewed. Allergies and medications reviewed and updated.  Review of Systems  Constitutional: Positive for fatigue. Negative for activity change, appetite change and fever.  HENT: Positive for congestion, postnasal drip, rhinorrhea and sore throat. Negative for ear discharge, ear pain, facial swelling, sinus pressure, sinus pain, sneezing and voice change.   Eyes: Negative for pain and visual disturbance.  Respiratory: Positive for cough. Negative for chest tightness, shortness of breath and wheezing.   Cardiovascular: Negative for chest pain, palpitations and leg swelling.  Gastrointestinal: Negative.   Endocrine: Negative.   Musculoskeletal: Negative for myalgias.  Neurological: Positive for headaches. Negative for dizziness and numbness.  Psychiatric/Behavioral: Negative.     Per HPI unless specifically indicated above     Objective:    LMP  (LMP Unknown)   Wt Readings from Last 3 Encounters:  12/09/20 168 lb 8 oz (76.4 kg)  07/23/20 183 lb 3.2 oz (83.1 kg)  05/23/20 182 lb 1.6 oz (82.6 kg)    Physical Exam Vitals and nursing note reviewed.  Constitutional:      General: She is  awake. She is not in acute distress.    Appearance: She is well-developed and well-groomed. She is not ill-appearing or toxic-appearing.  HENT:     Head: Normocephalic.     Right Ear: Hearing normal.     Left Ear: Hearing normal.  Eyes:     General: Lids are normal.        Right eye: No discharge.        Left eye: No discharge.     Conjunctiva/sclera: Conjunctivae normal.   Pulmonary:     Effort: Pulmonary effort is normal. No accessory muscle usage or respiratory distress.  Musculoskeletal:     Cervical back: Normal range of motion.  Neurological:     Mental Status: She is alert and oriented to person, place, and time.  Psychiatric:        Attention and Perception: Attention normal.        Mood and Affect: Mood normal.        Behavior: Behavior normal. Behavior is cooperative.        Thought Content: Thought content normal.        Judgment: Judgment normal.     Results for orders placed or performed in visit on 12/09/20  Lactate dehydrogenase  Result Value Ref Range   LDH 140 98 - 192 U/L  Comprehensive metabolic panel  Result Value Ref Range   Sodium 138 135 - 145 mmol/L   Potassium 4.0 3.5 - 5.1 mmol/L   Chloride 104 98 - 111 mmol/L   CO2 25 22 - 32 mmol/L   Glucose, Bld 121 (H) 70 - 99 mg/dL   BUN 15 6 - 20 mg/dL   Creatinine, Ser 0.97 0.44 - 1.00 mg/dL   Calcium 9.1 8.9 - 10.3 mg/dL   Total Protein 6.9 6.5 - 8.1 g/dL   Albumin 4.1 3.5 - 5.0 g/dL   AST 17 15 - 41 U/L   ALT 16 0 - 44 U/L   Alkaline Phosphatase 88 38 - 126 U/L   Total Bilirubin 0.4 0.3 - 1.2 mg/dL   GFR, Estimated >60 >60 mL/min   Anion gap 9 5 - 15  CBC with Differential/Platelet  Result Value Ref Range   WBC 6.7 4.0 - 10.5 K/uL   RBC 4.24 3.87 - 5.11 MIL/uL   Hemoglobin 12.5 12.0 - 15.0 g/dL   HCT 37.6 36.0 - 46.0 %   MCV 88.7 80.0 - 100.0 fL   MCH 29.5 26.0 - 34.0 pg   MCHC 33.2 30.0 - 36.0 g/dL   RDW 12.3 11.5 - 15.5 %   Platelets 241 150 - 400 K/uL   nRBC 0.0 0.0 - 0.2 %   Neutrophils Relative % 65 %   Neutro Abs 4.3 1.7 - 7.7 K/uL   Lymphocytes Relative 28 %   Lymphs Abs 1.8 0.7 - 4.0 K/uL   Monocytes Relative 7 %   Monocytes Absolute 0.4 0.1 - 1.0 K/uL   Eosinophils Relative 0 %   Eosinophils Absolute 0.0 0.0 - 0.5 K/uL   Basophils Relative 0 %   Basophils Absolute 0.0 0.0 - 0.1 K/uL   Immature Granulocytes 0 %   Abs Immature Granulocytes 0.02 0.00  - 0.07 K/uL      Assessment & Plan:   Problem List Items Addressed This Visit      Other   Lab test positive for detection of COVID-19 virus - Primary    Acute with positive home test today and symptoms starting 01/13/21.  Will place referral  to Covid treatment team for assistance with treatment regimen for patient.  At this time will defer Prednisone treatment due possible Covid treatment.  Script for Tussionex and Viscous Lidocaine sent in.  Recommend patient self quarantine at this time for a total of 10 days.  Will obtain testing at office today since she does not have documentation of her home test.  Plan on follow-up in 2 weeks.  Sooner if any worsening symptoms.      Relevant Medications   chlorpheniramine-HYDROcodone (TUSSIONEX PENNKINETIC ER) 10-8 MG/5ML SUER   Other Relevant Orders   Ambulatory referral for Covid Treatment   Novel Coronavirus, NAA ([facility])      I discussed the assessment and treatment plan with the patient. The patient was provided an opportunity to ask questions and all were answered. The patient agreed with the plan and demonstrated an understanding of the instructions.   The patient was advised to call back or seek an in-person evaluation if the symptoms worsen or if the condition fails to improve as anticipated.   I provided 21+ minutes of time during this encounter.  Follow up plan: Return in about 2 weeks (around 01/30/2021) for Covid follow-up.

## 2021-01-17 LAB — SARS-COV-2, NAA 2 DAY TAT

## 2021-01-17 LAB — NOVEL CORONAVIRUS, NAA: SARS-CoV-2, NAA: DETECTED — AB

## 2021-01-20 ENCOUNTER — Encounter: Payer: 59 | Admitting: Nurse Practitioner

## 2021-02-03 ENCOUNTER — Other Ambulatory Visit: Payer: Self-pay

## 2021-02-06 ENCOUNTER — Encounter: Payer: Self-pay | Admitting: Nurse Practitioner

## 2021-02-06 ENCOUNTER — Ambulatory Visit: Payer: 59 | Admitting: Nurse Practitioner

## 2021-02-06 ENCOUNTER — Other Ambulatory Visit: Payer: Self-pay

## 2021-02-06 VITALS — BP 112/80 | HR 71 | Temp 97.5°F | Wt 163.4 lb

## 2021-02-06 DIAGNOSIS — J028 Acute pharyngitis due to other specified organisms: Secondary | ICD-10-CM

## 2021-02-06 DIAGNOSIS — B9789 Other viral agents as the cause of diseases classified elsewhere: Secondary | ICD-10-CM | POA: Diagnosis not present

## 2021-02-06 DIAGNOSIS — H6501 Acute serous otitis media, right ear: Secondary | ICD-10-CM | POA: Diagnosis not present

## 2021-02-06 DIAGNOSIS — H6690 Otitis media, unspecified, unspecified ear: Secondary | ICD-10-CM | POA: Insufficient documentation

## 2021-02-06 MED ORDER — AMOXICILLIN-POT CLAVULANATE 875-125 MG PO TABS: 1.0000 | ORAL_TABLET | Freq: Two times a day (BID) | ORAL | 0 refills | Status: AC

## 2021-02-06 MED ORDER — FLUCONAZOLE 150 MG PO TABS: 150.0000 mg | ORAL_TABLET | Freq: Once | ORAL | 0 refills | Status: AC

## 2021-02-06 NOTE — Patient Instructions (Signed)

## 2021-02-06 NOTE — Assessment & Plan Note (Signed)
Post- Covid, will treat at this time with Augmentin x 7 days.  Recommend taking Tylenol as needed for pain.  TM is intact, no perforated.  Return to office in 2 weeks for follow-up.

## 2021-02-06 NOTE — Assessment & Plan Note (Signed)
Suspect more viral, low suspicion for strep and patient prefers not to be swabbed today.  At this time recommend use of viscous lidocaine and Tylenol as needed.  Suspect more viral related to recent Covid, with lingering symptoms.  Discussed with her taking daily Claritin and Flonase to help with nasal drainage.  Return in 2 weeks, sooner if worsening.

## 2021-02-06 NOTE — Progress Notes (Signed)
BP 112/80   Pulse 71   Temp (!) 97.5 F (36.4 C) (Oral)   Wt 163 lb 6.4 oz (74.1 kg)   LMP  (LMP Unknown)   SpO2 98%   BMI 26.37 kg/m    Subjective:    Patient ID: Christina [name], female    DOB: 1961-04-28, 60 y.o.   MRN: [id number]  HPI: [name] is a 60 y.o. female  Chief Complaint  Patient presents with  . Sore Throat    Patient states her throat feels as if something is stuck in there and states it hurts really bad in the mornings and says it really red and irritated. Patient states she her throat is worse after having COVID.   SORE THROAT Recently had Covid on 01/16/21 and did receive Paxlovid outpatient for treatment -- she had to stop taking this, it made her feel worse.  In the mornings it is worse, feels like something is stuck in throat.  Sore throat got a bit better during Covid, but then past week has gotten worse. Fever: no Cough: no Shortness of breath: no Wheezing: no Chest pain: no Chest tightness: no Chest congestion: no Nasal congestion: no Runny nose: yes Post nasal drip: no Sneezing: no Sore throat: yes Swollen glands: no Sinus pressure: no Headache: no Face pain: no Toothache: no Ear pain: yes "right Ear pressure: yes "right Eyes red/itching:no Eye drainage/crusting: no  Vomiting: no Rash: no Fatigue: no Sick contacts: no Strep contacts: no  Context: worse Relief with OTC cold/cough medications: no  Treatments attempted: Tylenol   Relevant past medical, surgical, family and social history reviewed and updated as indicated. Interim medical history since our last visit reviewed. Allergies and medications reviewed and updated.  Review of Systems  Per HPI unless specifically indicated above     Objective:    BP 112/80   Pulse 71   Temp (!) 97.5 F (36.4 C) (Oral)   Wt 163 lb 6.4 oz (74.1 kg)   LMP  (LMP Unknown)   SpO2 98%   BMI 26.37 kg/m   Wt Readings from Last 3 Encounters:  02/06/21 163 lb 6.4 oz (74.1 kg)   12/09/20 168 lb 8 oz (76.4 kg)  07/23/20 183 lb 3.2 oz (83.1 kg)    Physical Exam Vitals and nursing note reviewed.  Constitutional:      General: She is awake. She is not in acute distress.    Appearance: She is well-developed, well-groomed and overweight. She is not ill-appearing or toxic-appearing.  HENT:     Head: Normocephalic.     Right Ear: Hearing, ear canal and external ear normal. No tenderness. There is no impacted cerumen. Tympanic membrane is injected and bulging. Tympanic membrane is not perforated.     Left Ear: Hearing, tympanic membrane, ear canal and external ear normal.     Nose: Nose normal. No rhinorrhea.     Right Sinus: No maxillary sinus tenderness or frontal sinus tenderness.     Left Sinus: No maxillary sinus tenderness or frontal sinus tenderness.     Mouth/Throat:     Mouth: Mucous membranes are moist.     Pharynx: Posterior oropharyngeal erythema (mild with cobblestoning appearance) present. No pharyngeal swelling, oropharyngeal exudate or uvula swelling.     Tonsils: 0 on the right. 0 on the left.  Eyes:     General: Lids are normal.        Right eye: No discharge.  Left eye: No discharge.     Conjunctiva/sclera: Conjunctivae normal.     Pupils: Pupils are equal, round, and reactive to light.  Neck:     Thyroid: No thyromegaly.     Vascular: No carotid bruit.  Cardiovascular:     Rate and Rhythm: Normal rate and regular rhythm.     Heart sounds: Normal heart sounds. No murmur heard. No gallop.   Pulmonary:     Effort: Pulmonary effort is normal. No accessory muscle usage or respiratory distress.     Breath sounds: Normal breath sounds.  Abdominal:     General: Bowel sounds are normal.     Palpations: Abdomen is soft.  Musculoskeletal:     Cervical back: Normal range of motion and neck supple.     Right lower leg: No edema.     Left lower leg: No edema.  Lymphadenopathy:     Cervical: No cervical adenopathy.  Skin:    General: Skin is  warm and dry.  Neurological:     Mental Status: She is alert and oriented to person, place, and time.  Psychiatric:        Attention and Perception: Attention normal.        Mood and Affect: Mood normal.        Behavior: Behavior normal. Behavior is cooperative.        Thought Content: Thought content normal.        Judgment: Judgment normal.    Results for orders placed or performed in visit on 01/16/21  Novel Coronavirus, NAA ([facility])   Specimen: Nasopharyngeal(NP) swabs in vial transport medium  Result Value Ref Range   SARS-CoV-2, NAA Detected (A) Not Detected  SARS-COV-2, NAA 2 DAY TAT  Result Value Ref Range   SARS-CoV-2, NAA 2 DAY TAT Performed       Assessment & Plan:   Problem List Items Addressed This Visit      Respiratory   Sore throat (viral)    Suspect more viral, low suspicion for strep and patient prefers not to be swabbed today.  At this time recommend use of viscous lidocaine and Tylenol as needed.  Suspect more viral related to recent Covid, with lingering symptoms.  Discussed with her taking daily Claritin and Flonase to help with nasal drainage.  Return in 2 weeks, sooner if worsening.      Relevant Medications   fluconazole (DIFLUCAN) 150 MG tablet     Nervous and Auditory   Acute otitis media - Primary    Post- Covid, will treat at this time with Augmentin x 7 days.  Recommend taking Tylenol as needed for pain.  TM is intact, no perforated.  Return to office in 2 weeks for follow-up.      Relevant Medications   amoxicillin-clavulanate (AUGMENTIN) 875-125 MG tablet   fluconazole (DIFLUCAN) 150 MG tablet       Follow up plan: Return in about 2 weeks (around 02/20/2021) for Sore throat and ear check.

## 2021-02-19 ENCOUNTER — Encounter: Payer: Self-pay | Admitting: Internal Medicine

## 2021-02-19 ENCOUNTER — Other Ambulatory Visit: Payer: Self-pay

## 2021-02-20 ENCOUNTER — Encounter: Payer: Self-pay | Admitting: Nurse Practitioner

## 2021-02-20 ENCOUNTER — Encounter: Payer: Self-pay | Admitting: Internal Medicine

## 2021-02-20 ENCOUNTER — Ambulatory Visit: Payer: BC Managed Care – PPO | Admitting: Nurse Practitioner

## 2021-02-20 VITALS — BP 111/72 | HR 67 | Temp 98.4°F | Wt 164.2 lb

## 2021-02-20 DIAGNOSIS — H6501 Acute serous otitis media, right ear: Secondary | ICD-10-CM | POA: Diagnosis not present

## 2021-02-20 DIAGNOSIS — Z8616 Personal history of COVID-19: Secondary | ICD-10-CM

## 2021-02-20 NOTE — Patient Instructions (Signed)
Healthy Eating Following a healthy eating pattern may help you to achieve and maintain a healthy body weight, reduce the risk of chronic disease, and live a long and productive life. It is important to follow a healthy eating pattern at an appropriate calorie level for your body. Your nutritional needs should be met primarily through food by choosing a variety of nutrient-rich foods. What are tips for following this plan? Reading food labels  Read labels and choose the following: ? Reduced or low sodium. ? Juices with 100% fruit juice. ? Foods with low saturated fats and high polyunsaturated and monounsaturated fats. ? Foods with whole grains, such as whole wheat, cracked wheat, brown rice, and wild rice. ? Whole grains that are fortified with folic acid. This is recommended for women who are pregnant or who want to become pregnant.  Read labels and avoid the following: ? Foods with a lot of added sugars. These include foods that contain brown sugar, corn sweetener, corn syrup, dextrose, fructose, glucose, high-fructose corn syrup, honey, invert sugar, lactose, malt syrup, maltose, molasses, raw sugar, sucrose, trehalose, or turbinado sugar.  Do not eat more than the following amounts of added sugar per day:  6 teaspoons (25 g) for women.  9 teaspoons (38 g) for men. ? Foods that contain processed or refined starches and grains. ? Refined grain products, such as white flour, degermed cornmeal, white bread, and white rice. Shopping  Choose nutrient-rich snacks, such as vegetables, whole fruits, and nuts. Avoid high-calorie and high-sugar snacks, such as potato chips, fruit snacks, and candy.  Use oil-based dressings and spreads on foods instead of solid fats such as butter, stick margarine, or cream cheese.  Limit pre-made sauces, mixes, and "instant" products such as flavored rice, instant noodles, and ready-made pasta.  Try more plant-protein sources, such as tofu, tempeh, black beans,  edamame, lentils, nuts, and seeds.  Explore eating plans such as the Mediterranean diet or vegetarian diet. Cooking  Use oil to saut or stir-fry foods instead of solid fats such as butter, stick margarine, or lard.  Try baking, boiling, grilling, or broiling instead of frying.  Remove the fatty part of meats before cooking.  Steam vegetables in water or broth. Meal planning  At meals, imagine dividing your plate into fourths: ? One-half of your plate is fruits and vegetables. ? One-fourth of your plate is whole grains. ? One-fourth of your plate is protein, especially lean meats, poultry, eggs, tofu, beans, or nuts.  Include low-fat dairy as part of your daily diet.   Lifestyle  Choose healthy options in all settings, including home, work, school, restaurants, or stores.  Prepare your food safely: ? Wash your hands after handling raw meats. ? Keep food preparation surfaces clean by regularly washing with hot, soapy water. ? Keep raw meats separate from ready-to-eat foods, such as fruits and vegetables. ? Cook seafood, meat, poultry, and eggs to the recommended internal temperature. ? Store foods at safe temperatures. In general:  Keep cold foods at 7F (4.4C) or below.  Keep hot foods at 17F (60C) or above.  Keep your freezer at [facility] (-17.8C) or below.  Foods are no longer safe to eat when they have been between the temperatures of 40-17F (4.4-60C) for more than 2 hours. What foods should I eat? Fruits Aim to eat 2 cup-equivalents of fresh, canned (in natural juice), or frozen fruits each day. Examples of 1 cup-equivalent of fruit include 1 small apple, 8 large strawberries, 1 cup canned fruit,  cup dried fruit, or 1 cup 100% juice. Vegetables Aim to eat 2-3 cup-equivalents of fresh and frozen vegetables each day, including different varieties and colors. Examples of 1 cup-equivalent of vegetables include 2 medium carrots, 2 cups raw, leafy greens, 1 cup chopped  vegetable (raw or cooked), or 1 medium baked potato. Grains Aim to eat 6 ounce-equivalents of whole grains each day. Examples of 1 ounce-equivalent of grains include 1 slice of bread, 1 cup ready-to-eat cereal, 3 cups popcorn, or  cup cooked rice, pasta, or cereal. Meats and other proteins Aim to eat 5-6 ounce-equivalents of protein each day. Examples of 1 ounce-equivalent of protein include 1 egg, 1/2 cup nuts or seeds, or 1 tablespoon (16 g) peanut butter. A cut of meat or fish that is the size of a deck of cards is about 3-4 ounce-equivalents.  Of the protein you eat each week, try to have at least 8 ounces come from seafood. This includes salmon, trout, herring, and anchovies. Dairy Aim to eat 3 cup-equivalents of fat-free or low-fat dairy each day. Examples of 1 cup-equivalent of dairy include 1 cup (240 mL) milk, 8 ounces (250 g) yogurt, 1 ounces (44 g) natural cheese, or 1 cup (240 mL) fortified soy milk. Fats and oils  Aim for about 5 teaspoons (21 g) per day. Choose monounsaturated fats, such as canola and olive oils, avocados, peanut butter, and most nuts, or polyunsaturated fats, such as sunflower, corn, and soybean oils, walnuts, pine nuts, sesame seeds, sunflower seeds, and flaxseed. Beverages  Aim for six 8-oz glasses of water per day. Limit coffee to three to five 8-oz cups per day.  Limit caffeinated beverages that have added calories, such as soda and energy drinks.  Limit alcohol intake to no more than 1 drink a day for nonpregnant women and 2 drinks a day for men. One drink equals 12 oz of beer (355 mL), 5 oz of wine (148 mL), or 1 oz of hard liquor (44 mL). Seasoning and other foods  Avoid adding excess amounts of salt to your foods. Try flavoring foods with herbs and spices instead of salt.  Avoid adding sugar to foods.  Try using oil-based dressings, sauces, and spreads instead of solid fats. This information is based on general [place] nutrition guidelines. For more  information, visit choosemyplate.gov. Exact amounts may vary based on your nutrition needs. Summary  A healthy eating plan may help you to maintain a healthy weight, reduce the risk of chronic diseases, and stay active throughout your life.  Plan your meals. Make sure you eat the right portions of a variety of nutrient-rich foods.  Try baking, boiling, grilling, or broiling instead of frying.  Choose healthy options in all settings, including home, work, school, restaurants, or stores. This information is not intended to replace advice given to you by your health care provider. Make sure you discuss any questions you have with your health care provider. Document Revised: 12/19/2017 Document Reviewed: 12/19/2017 Elsevier Patient Education  2021 Elsevier Inc.  

## 2021-02-20 NOTE — Assessment & Plan Note (Signed)
No further symptoms at this time, continue to monitor.

## 2021-02-20 NOTE — Progress Notes (Signed)
BP 111/72   Pulse 67   Temp 98.4 F (36.9 C) (Oral)   Wt 164 lb 3.2 oz (74.5 kg)   LMP  (LMP Unknown)   SpO2 99%   BMI 26.50 kg/m    Subjective:    Patient ID: Christina [name], female    DOB: 05/29/1961, 60 y.o.   MRN: [id number]  HPI: Christina [name] is a 60 y.o. female  Chief Complaint  Patient presents with  . Sore Throat  . Ear Pain  . Follow-up    Patient states the medication that was prescribed at the last visit has help and patient states she is feeling better. Patient has 1 day left on the prescription.    SORE THROAT Follow-up for ear pain and sore throat -- treated with viscous lidocaine and Claritin + Flonase.  Recently had Covid on 01/16/21 and did receive Paxlovid outpatient for treatment -- she had to stop taking this, it made her feel worse.  In the mornings it is worse, feels like something is stuck in throat.  Sore throat got a bit better during Covid, but then past week has gotten worse. Fever: no Cough: no Shortness of breath: no Wheezing: no Chest pain: no Chest tightness: no Chest congestion: no Nasal congestion: no Runny nose: yes Post nasal drip: no Sneezing: no Sore throat: yes Swollen glands: no Sinus pressure: no Headache: no Face pain: no Toothache: no Ear pain: yes "right Ear pressure: yes "right Eyes red/itching:no Eye drainage/crusting: no  Vomiting: no Rash: no Fatigue: no Sick contacts: no Strep contacts: no  Context: worse Relief with OTC cold/cough medications: no  Treatments attempted: Tylenol   Relevant past medical, surgical, family and social history reviewed and updated as indicated. Interim medical history since our last visit reviewed. Allergies and medications reviewed and updated.  Review of Systems  Constitutional: Negative.   HENT: Negative.   Respiratory: Negative.   Cardiovascular: Negative.   Neurological: Negative.     Per HPI unless specifically indicated above     Objective:    BP 111/72    Pulse 67   Temp 98.4 F (36.9 C) (Oral)   Wt 164 lb 3.2 oz (74.5 kg)   LMP  (LMP Unknown)   SpO2 99%   BMI 26.50 kg/m   Wt Readings from Last 3 Encounters:  02/20/21 164 lb 3.2 oz (74.5 kg)  02/06/21 163 lb 6.4 oz (74.1 kg)  12/09/20 168 lb 8 oz (76.4 kg)    Physical Exam Vitals and nursing note reviewed.  Constitutional:      General: She is awake. She is not in acute distress.    Appearance: She is well-developed, well-groomed and overweight. She is not ill-appearing or toxic-appearing.  HENT:     Head: Normocephalic.     Right Ear: Hearing, tympanic membrane, ear canal and external ear normal.     Left Ear: Hearing, tympanic membrane, ear canal and external ear normal.     Nose: Nose normal. No rhinorrhea.     Right Sinus: No maxillary sinus tenderness or frontal sinus tenderness.     Left Sinus: No maxillary sinus tenderness or frontal sinus tenderness.     Mouth/Throat:     Mouth: Mucous membranes are moist.  Eyes:     General: Lids are normal.        Right eye: No discharge.        Left eye: No discharge.     Conjunctiva/sclera: Conjunctivae normal.  Pupils: Pupils are equal, round, and reactive to light.  Neck:     Thyroid: No thyromegaly.     Vascular: No carotid bruit.  Cardiovascular:     Rate and Rhythm: Normal rate and regular rhythm.     Heart sounds: Normal heart sounds. No murmur heard. No gallop.   Pulmonary:     Effort: Pulmonary effort is normal. No accessory muscle usage or respiratory distress.     Breath sounds: Normal breath sounds.  Abdominal:     General: Bowel sounds are normal.     Palpations: Abdomen is soft.  Musculoskeletal:     Cervical back: Normal range of motion and neck supple.     Right lower leg: No edema.     Left lower leg: No edema.  Lymphadenopathy:     Cervical: No cervical adenopathy.  Skin:    General: Skin is warm and dry.  Neurological:     Mental Status: She is alert and oriented to person, place, and time.   Psychiatric:        Attention and Perception: Attention normal.        Mood and Affect: Mood normal.        Behavior: Behavior normal. Behavior is cooperative.        Thought Content: Thought content normal.        Judgment: Judgment normal.    Results for orders placed or performed in visit on 01/16/21  Novel Coronavirus, NAA ([facility])   Specimen: Nasopharyngeal(NP) swabs in vial transport medium  Result Value Ref Range   SARS-CoV-2, NAA Detected (A) Not Detected  SARS-COV-2, NAA 2 DAY TAT  Result Value Ref Range   SARS-CoV-2, NAA 2 DAY TAT Performed       Assessment & Plan:   Problem List Items Addressed This Visit      Nervous and Auditory   Acute otitis media - Primary    Acute and improved at this time with no further symptoms.  Will follow-up as in November.      Relevant Medications   fluconazole (DIFLUCAN) 150 MG tablet     Other   History of 2019 novel coronavirus disease (COVID-19)    No further symptoms at this time, continue to monitor.          Follow up plan: Return for as scheduled.

## 2021-02-20 NOTE — Assessment & Plan Note (Signed)
Acute and improved at this time with no further symptoms.  Will follow-up as in November.

## 2021-02-28 ENCOUNTER — Encounter: Payer: Self-pay | Admitting: Nurse Practitioner

## 2021-03-08 ENCOUNTER — Other Ambulatory Visit: Payer: Self-pay | Admitting: Nurse Practitioner

## 2021-03-08 NOTE — Telephone Encounter (Signed)
Requested Prescriptions  Pending Prescriptions Disp Refills  . levothyroxine (SYNTHROID) 50 MCG tablet [Pharmacy Med Name: LEVOTHYROXINE 50 MCG TABLET] 90 tablet 1    Sig: TAKE 1 TABLET BY MOUTH EVERY DAY     Endocrinology:  Hypothyroid Agents Failed - 03/08/2021  9:06 AM      Failed - TSH needs to be rechecked within 3 months after an abnormal result. Refill until TSH is due.      Passed - TSH in normal range and within 360 days    TSH  Date Value Ref Range Status  07/23/2020 3.600 0.450 - 4.500 uIU/mL Final         Passed - Valid encounter within last 12 months    Recent Outpatient Visits          2 weeks ago Non-recurrent acute serous otitis media of right ear   [place], Jolene T, NP   1 month ago Non-recurrent acute serous otitis media of right ear   [place], Barbaraann Faster, NP   1 month ago Lab test positive for detection of COVID-19 virus   [facility] [place], [place] T, NP   7 months ago 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[place])   [place], Barbaraann Faster, NP   1 year ago Neuropathy   [place], Barbaraann Faster, NP      Future Appointments            In 4 months [place], Barbaraann Faster, NP [employer]

## 2021-03-12 ENCOUNTER — Other Ambulatory Visit: Payer: Self-pay | Admitting: Nurse Practitioner

## 2021-03-26 DIAGNOSIS — E663 Overweight: Secondary | ICD-10-CM | POA: Diagnosis not present

## 2021-04-29 DIAGNOSIS — E663 Overweight: Secondary | ICD-10-CM | POA: Diagnosis not present

## 2021-06-02 ENCOUNTER — Encounter: Payer: Self-pay | Admitting: Internal Medicine

## 2021-06-08 ENCOUNTER — Encounter: Payer: Self-pay | Admitting: Internal Medicine

## 2021-06-09 ENCOUNTER — Encounter: Payer: Self-pay | Admitting: Oncology

## 2021-06-09 ENCOUNTER — Inpatient Hospital Stay (HOSPITAL_BASED_OUTPATIENT_CLINIC_OR_DEPARTMENT_OTHER): Payer: BC Managed Care – PPO | Admitting: Oncology

## 2021-06-09 ENCOUNTER — Inpatient Hospital Stay: Payer: BC Managed Care – PPO | Attending: Oncology

## 2021-06-09 VITALS — BP 119/77 | HR 77 | Temp 97.0°F | Resp 16 | Wt 170.0 lb

## 2021-06-09 DIAGNOSIS — Z9049 Acquired absence of other specified parts of digestive tract: Secondary | ICD-10-CM | POA: Insufficient documentation

## 2021-06-09 DIAGNOSIS — Z801 Family history of malignant neoplasm of trachea, bronchus and lung: Secondary | ICD-10-CM | POA: Insufficient documentation

## 2021-06-09 DIAGNOSIS — C8331 Diffuse large B-cell lymphoma, lymph nodes of head, face, and neck: Secondary | ICD-10-CM

## 2021-06-09 DIAGNOSIS — R591 Generalized enlarged lymph nodes: Secondary | ICD-10-CM | POA: Diagnosis not present

## 2021-06-09 DIAGNOSIS — Z79899 Other long term (current) drug therapy: Secondary | ICD-10-CM | POA: Diagnosis not present

## 2021-06-09 DIAGNOSIS — Z8249 Family history of ischemic heart disease and other diseases of the circulatory system: Secondary | ICD-10-CM | POA: Insufficient documentation

## 2021-06-09 DIAGNOSIS — Z803 Family history of malignant neoplasm of breast: Secondary | ICD-10-CM | POA: Diagnosis not present

## 2021-06-09 DIAGNOSIS — C8338 Diffuse large B-cell lymphoma, lymph nodes of multiple sites: Secondary | ICD-10-CM | POA: Insufficient documentation

## 2021-06-09 DIAGNOSIS — Z08 Encounter for follow-up examination after completed treatment for malignant neoplasm: Secondary | ICD-10-CM

## 2021-06-09 DIAGNOSIS — Z8579 Personal history of other malignant neoplasms of lymphoid, hematopoietic and related tissues: Secondary | ICD-10-CM

## 2021-06-09 LAB — COMPREHENSIVE METABOLIC PANEL
ALT: 14 U/L (ref 0–44)
AST: 14 U/L — ABNORMAL LOW (ref 15–41)
Albumin: 3.9 g/dL (ref 3.5–5.0)
Alkaline Phosphatase: 84 U/L (ref 38–126)
Anion gap: 6 (ref 5–15)
BUN: 16 mg/dL (ref 6–20)
CO2: 29 mmol/L (ref 22–32)
Calcium: 9.2 mg/dL (ref 8.9–10.3)
Chloride: 105 mmol/L (ref 98–111)
Creatinine, Ser: 0.93 mg/dL (ref 0.44–1.00)
GFR, Estimated: >60 mL/min (ref >60)
Glucose, Bld: 109 mg/dL — ABNORMAL HIGH (ref 70–99)
Potassium: 3.9 mmol/L (ref 3.5–5.1)
Sodium: 140 mmol/L (ref 135–145)
Total Bilirubin: 0.5 mg/dL (ref 0.3–1.2)
Total Protein: 6.9 g/dL (ref 6.5–8.1)

## 2021-06-09 LAB — CBC WITH DIFFERENTIAL/PLATELET
Abs Immature Granulocytes: 0.02 10*3/uL (ref 0.00–0.07)
Basophils Absolute: 0 10*3/uL (ref 0.0–0.1)
Basophils Relative: 1 %
Eosinophils Absolute: 0 10*3/uL (ref 0.0–0.5)
Eosinophils Relative: 1 %
HCT: 37.4 % (ref 36.0–46.0)
Hemoglobin: 12 g/dL (ref 12.0–15.0)
Immature Granulocytes: 0 %
Lymphocytes Relative: 28 %
Lymphs Abs: 1.8 10*3/uL (ref 0.7–4.0)
MCH: 29.2 pg (ref 26.0–34.0)
MCHC: 32.1 g/dL (ref 30.0–36.0)
MCV: 91 fL (ref 80.0–100.0)
Monocytes Absolute: 0.5 10*3/uL (ref 0.1–1.0)
Monocytes Relative: 8 %
Neutro Abs: 4 10*3/uL (ref 1.7–7.7)
Neutrophils Relative %: 62 %
Platelets: 266 10*3/uL (ref 150–400)
RBC: 4.11 MIL/uL (ref 3.87–5.11)
RDW: 12.3 % (ref 11.5–15.5)
WBC: 6.3 10*3/uL (ref 4.0–10.5)
nRBC: 0 % (ref 0.0–0.2)

## 2021-06-09 LAB — LACTATE DEHYDROGENASE: LDH: 122 U/L (ref 98–192)

## 2021-06-09 NOTE — Progress Notes (Signed)
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 Patient is a 60 year old female who presented to her primary care doctor with symptoms of right neck swelling in January 2018. Her symptoms of an ongoing since December 2017. Patient works at a [employer] (medial in the swelling but the swelling did not go away. She was then referred to ENT.    2. CT of the soft tissue neck with contrast showed malignant right leg lymphadenopathy with heterogeneous enhancement and extracapsular extension occupying both the right level II and level III nodal stations. Individually B abnormal lymph nodes measure up to 4.2 cm in largest dimension and the conglomerulation of abnormal lymph nodes is 5-5.5 cm across. There is a small but asymmetric and conspicuity 7 mm lymph node along the lower right 3-B station. Mass effect on the right carotid space from the abnormal nodes but the major vascular structures in the neck at the skull base including the right IJ remained patent.   3. 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 Cells were CD20 positive and BCL 6 and partially dim BCL-2 positive. B cells were negative for C myc (10%). Mom one was positive in 50% of the cells. Ki-67 was 50%.Bone marrow biopsy was negative for lymphoma involvement   4.  PET/CT showed IMPRESSION: 1. FDG avid malignancy is identified in the level 2 and  3 nodal stations of the right cervical lymph node chain, consistent with the patient's known lymphoma. 2. Focal uptake in the gastric cardia is suspicious for malignancy as well. Recommend direct visualization and biopsy as clinically Warranted.   5. EGD showed diffuse moderately erythematous mucosa without bleeding initial nonbleeding gastric ulcer 6 mm which was biopsied. Findings were consistent with H. pylori gastritis and patient was started on antibiotic therapy for the same. Repeat H pylori testing negative   6. EUS also showed H pylori. There were few B cells noted in lamina propria and clonality studies have been ordered. Initial testing for B cell clonality showed no monoclonal B-cell population. Confirmation testing also did not reveal clonal B cells   7. PET/CT scan after cycles of RCHOP showed: IMPRESSION: 1. Complete metabolic response.  No metabolically active lymphoma. 2. Uniform low-level marrow hypermetabolism throughout the axial skeleton, compatible with reactive marrow state due to chemotherapy. 3. No residual gastric hypermetabolism   8. She completed 3 cycles of RCHOP between feb 2018-aoril 2018 followed by IFRT to her neck in June 2018    Interval history-patient reports doing well denies any new complaints at this time.  Appetite and weight have remained stable.  States she is try to lose weight but has been unsuccessful.  She does not feel that she eats that many calories and is unsure of why she is not losing.  She is active.  Denies any night sweats or new lumps or bumps.  ECOG PS- 0 Pain scale- 0   Review of systems- Review of Systems  All other  systems reviewed and are negative.    No Known Allergies   Past Medical History:  Diagnosis Date   B-cell lymphoma ([place])    Cancer ([place]) 2018   lymphoma, non hodgekins B cell   Chest pain 12/08/2016   Chest pain 12/08/2016   Dehydration 12/08/2016   GERD (gastroesophageal reflux disease)    Hypothyroidism       Past Surgical History:  Procedure Laterality Date   [place]   x 2    CHOLECYSTECTOMY N/A 10/04/2019   Procedure: LAPAROSCOPIC CHOLECYSTECTOMY;  Surgeon: Olean Ree, MD;  Location: [facility] ORS;  Service: General;  Laterality: N/A;   COLONOSCOPY WITH PROPOFOL N/A 09/28/2017   Procedure: COLONOSCOPY WITH PROPOFOL;  Surgeon: Jonathon Bellows, MD;  Location: [facility] ENDOSCOPY;  Service: Gastroenterology;  Laterality: N/A;   ESOPHAGOGASTRODUODENOSCOPY (EGD) WITH PROPOFOL N/A 11/11/2016   Procedure: ESOPHAGOGASTRODUODENOSCOPY (EGD) WITH PROPOFOL;  Surgeon: Jonathon Bellows, MD;  Location: [facility] ENDOSCOPY;  Service: Endoscopy;  Laterality: N/A;   IR GENERIC HISTORICAL  11/03/2016   IR FLUORO GUIDE PORT INSERTION RIGHT 11/03/2016 Aletta Edouard, MD [facility]-INTERV RAD   IR REMOVAL TUN ACCESS W/ PORT W/O FL MOD SED  02/15/2017   REFRACTIVE SURGERY  09/2015    Social History   Socioeconomic History   Marital status: Single    Spouse name: Not on file   Number of children: Not on file   Years of education: Not on file   Highest education level: Not on file  Occupational History   Not on file  Tobacco Use   Smoking status: Never   Smokeless tobacco: Never  Vaping Use   Vaping Use: Never used  Substance and Sexual Activity   Alcohol use: No   Drug use: No   Sexual activity: Yes  Other Topics Concern   Not on file  Social History Narrative   Not on file   Social Determinants of Health   Financial Resource Strain: Not on file  Food Insecurity: Not on file  Transportation Needs: Not on file  Physical Activity: Not on file  Stress: Not on file  Social Connections: Not on file  Intimate Partner Violence: Not on file    Family History  Problem Relation Age of Onset   Cancer Mother        breast   Cancer Father        lung   Heart disease Brother 22       MI   Heart disease Paternal Grandfather        MI     Current Outpatient Medications:     gabapentin (NEURONTIN) 100 MG capsule, TAKE 1 CAPSULE BY MOUTH EVERYDAY AT BEDTIME, Disp: 90 capsule, Rfl: 1   levothyroxine (SYNTHROID) 50 MCG tablet, TAKE 1 TABLET BY MOUTH EVERY DAY, Disp: 90 tablet, Rfl: 1   lidocaine (XYLOCAINE) 2 % solution, Use as directed 15 mLs in the mouth or throat as needed for mouth pain., Disp: 100 mL, Rfl: 2  Physical exam:  Vitals:   06/09/21 1031  BP: 119/77  Pulse: 77  Resp: 16  Temp: (!) 97 F (36.1 C)  TempSrc: Tympanic  Weight: 170 lb (77.1 kg)   Physical Exam Constitutional:      Appearance: Normal appearance.  HENT:     Head: Normocephalic and atraumatic.  Eyes:     Pupils: Pupils are equal, round, and reactive to light.  Cardiovascular:     Rate and Rhythm: Normal rate  and regular rhythm.     Heart sounds: Normal heart sounds. No murmur heard. Pulmonary:     Effort: Pulmonary effort is normal.     Breath sounds: Normal breath sounds. No wheezing.  Abdominal:     General: Bowel sounds are normal. There is no distension.     Palpations: Abdomen is soft.     Tenderness: There is no abdominal tenderness.  Musculoskeletal:        General: Normal range of motion.     Cervical back: Normal range of motion.  Skin:    General: Skin is warm and dry.     Findings: No rash.  Neurological:     Mental Status: She is alert and oriented to person, place, and time.  Psychiatric:        Judgment: Judgment normal.     CMP Latest Ref Rng & Units 06/09/2021  Glucose 70 - 99 mg/dL 109(H)  BUN 6 - 20 mg/dL 16  Creatinine 0.44 - 1.00 mg/dL 0.93  Sodium 135 - 145 mmol/L 140  Potassium 3.5 - 5.1 mmol/L 3.9  Chloride 98 - 111 mmol/L 105  CO2 22 - 32 mmol/L 29  Calcium 8.9 - 10.3 mg/dL 9.2  Total Protein 6.5 - 8.1 g/dL 6.9  Total Bilirubin 0.3 - 1.2 mg/dL 0.5  Alkaline Phos 38 - 126 U/L 84  AST 15 - 41 U/L 14(L)  ALT 0 - 44 U/L 14   CBC Latest Ref Rng & Units 06/09/2021  WBC 4.0 - 10.5 K/uL 6.3  Hemoglobin 12.0 - 15.0 g/dL 12.0  Hematocrit 36.0  - 46.0 % 37.4  Platelets 150 - 400 K/uL 266      Assessment and plan- Patient is a 60 y.o. 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   Patient continues to do well with no concerning signs and symptoms of recurrence based on today's exam.  Reviewed lab work and it is essentially unremarkable.  She is now 4 years out of her initial diagnosis.  No indication for surveillance imaging at this time.  She will return to clinic in approximately 6 months for follow-up with Dr. Janese Banks with labs.  Disposition- RTC in 6 months with lab work (CBC with differential, CMP) and assessment with Dr. Janese Banks.  Visit Diagnosis 1. 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[facility])    I spent 20 minutes dedicated to the care of this patient (face-to-face and non-face-to-face) on the date of the encounter to include what is described in the assessment and plan.  Faythe Casa, NP 06/09/2021 11:25 AM

## 2021-06-09 NOTE — Progress Notes (Signed)
Patient here for follow up she has no questions or concerns.

## 2021-07-07 ENCOUNTER — Encounter: Payer: Self-pay | Admitting: Internal Medicine

## 2021-07-23 ENCOUNTER — Other Ambulatory Visit: Payer: Self-pay

## 2021-07-23 ENCOUNTER — Encounter: Payer: Self-pay | Admitting: Nurse Practitioner

## 2021-07-23 ENCOUNTER — Ambulatory Visit: Payer: BC Managed Care – PPO | Admitting: Nurse Practitioner

## 2021-07-23 VITALS — BP 117/75 | HR 65 | Temp 98.0°F | Wt 175.0 lb

## 2021-07-23 DIAGNOSIS — Z136 Encounter for screening for cardiovascular disorders: Secondary | ICD-10-CM | POA: Diagnosis not present

## 2021-07-23 DIAGNOSIS — R7301 Impaired fasting glucose: Secondary | ICD-10-CM | POA: Diagnosis not present

## 2021-07-23 DIAGNOSIS — C8331 Diffuse large B-cell lymphoma, lymph nodes of head, face, and neck: Secondary | ICD-10-CM | POA: Diagnosis not present

## 2021-07-23 DIAGNOSIS — E538 Deficiency of other specified B group vitamins: Secondary | ICD-10-CM | POA: Diagnosis not present

## 2021-07-23 DIAGNOSIS — E039 Hypothyroidism, unspecified: Secondary | ICD-10-CM

## 2021-07-23 DIAGNOSIS — G629 Polyneuropathy, unspecified: Secondary | ICD-10-CM

## 2021-07-23 DIAGNOSIS — E559 Vitamin D deficiency, unspecified: Secondary | ICD-10-CM

## 2021-07-23 DIAGNOSIS — Z6829 Body mass index (BMI) 29.0-29.9, adult: Secondary | ICD-10-CM

## 2021-07-23 MED ORDER — GABAPENTIN 100 MG PO CAPS: 200.0000 mg | ORAL_CAPSULE | Freq: Every day | ORAL | 4 refills | Status: DC

## 2021-07-23 NOTE — Assessment & Plan Note (Signed)
Chronic, ongoing.  Continue current medication regimen and adjust as needed based on labs.  Thyroid labs today including antibody testing.  Will obtain imaging thyroid due to poor symptom control.

## 2021-07-23 NOTE — Assessment & Plan Note (Signed)
Continue collaboration with oncology, recent note reviewed.  Reached out to them via message in this note to notify them of patient's decreased appetite, fatigue, and night sweats -- she did not alert them of this.  Will await recommendations.  Labs CBC, CMP, TSH today.

## 2021-07-23 NOTE — Assessment & Plan Note (Signed)
Ongoing with night sweats, ?menopause vs lymphoma effect -- have reached out to oncology to notify them of patient symptoms.  Increase Gabapentin to 200 MG at night,  determine if increased fatigue with this dose, + daily magnesium.  Recommend Tylenol as needed at home for discomfort and frequent stretching + increased hydration.  Will check B12, CBC, TSH, Mag today.

## 2021-07-23 NOTE — Progress Notes (Signed)
BP 117/75   Pulse 65   Temp 98 F (36.7 C) (Oral)   Wt 175 lb (79.4 kg)   LMP  (LMP Unknown)   SpO2 97%   BMI 28.25 kg/m    Subjective:    Patient ID: Christina [name], female    DOB: [birth year], 60 y.o.   MRN: [id number]  HPI: Christina [name] is a 60 y.o. female  Chief Complaint  Patient presents with   Hypothyroidism    Patient is here to follow up on Thyroid. Patient denies having any problems or concerns at today's visit.    HYPOTHYROIDISM Continues on Levothyroxine 50 MCG.  Denies CP, orthopnea, or cough.  She feels very fatigued and occasionally short of breath, like she did when first diagnosed with hypothyroid.   Thyroid control status:stable Satisfied with current treatment? no Medication side effects: no Medication compliance: good compliance Etiology of hypothyroidism: unknown Recent dose adjustment:no Fatigue: yes Cold intolerance: no Heat intolerance: no Weight gain: recent after coming off Phentermine Weight loss: no Constipation: yes Diarrhea/loose stools: no Palpitations: none Lower extremity edema: no Anxiety/depressed mood: no   B-CELL LYMPHOMA: Is followed by oncology, last saw them on 06/09/21.  Does endorse some night sweats for awhile, she felt it was related to Phentermine -- but stopped this and sweats continue.  Gabapentin is not offering benefit.  Partial hysterectomy in 30's, no current cycles.  Has had decreased appetite, even without Phentermine on board -- noticed more over past two weeks.  No fever.    Was followed Dr. Ouida Sills for weight loss, but became frustrated with hitting plateau at 160.  She stopped Phentermine in September due to frustration.  Took B12 shots every two weeks at weight management and felt more energy with these, has lost energy after stopping.  Relevant past medical, surgical, family and social history reviewed and updated as indicated. Interim medical history since our last visit reviewed. Allergies and  medications reviewed and updated.  Review of Systems  Constitutional:  Positive for appetite change and fatigue. Negative for activity change, diaphoresis and fever.  Respiratory:  Negative for cough, chest tightness, shortness of breath and wheezing.   Cardiovascular:  Negative for chest pain, palpitations and leg swelling.  Gastrointestinal: Negative.   Endocrine: Negative for cold intolerance, heat intolerance, polydipsia, polyphagia and polyuria.  Neurological: Negative.   Psychiatric/Behavioral: Negative.     Per HPI unless specifically indicated above     Objective:    BP 117/75   Pulse 65   Temp 98 F (36.7 C) (Oral)   Wt 175 lb (79.4 kg)   LMP  (LMP Unknown)   SpO2 97%   BMI 28.25 kg/m   Wt Readings from Last 3 Encounters:  07/23/21 175 lb (79.4 kg)  06/09/21 170 lb (77.1 kg)  02/20/21 164 lb 3.2 oz (74.5 kg)    Physical Exam Vitals and nursing note reviewed.  Constitutional:      General: She is awake. She is not in acute distress.    Appearance: She is well-developed, well-groomed and overweight. She is not ill-appearing or toxic-appearing.  HENT:     Head: Normocephalic.     Right Ear: Hearing normal.     Left Ear: Hearing normal.  Eyes:     General: Lids are normal.        Right eye: No discharge.        Left eye: No discharge.     Conjunctiva/sclera: Conjunctivae normal.     Pupils: Pupils  are equal, round, and reactive to light.  Neck:     Thyroid: No thyromegaly.     Vascular: No carotid bruit.  Cardiovascular:     Rate and Rhythm: Normal rate and regular rhythm.     Heart sounds: Normal heart sounds. No murmur heard.   No gallop.  Pulmonary:     Effort: Pulmonary effort is normal. No accessory muscle usage or respiratory distress.     Breath sounds: Normal breath sounds.  Abdominal:     General: Bowel sounds are normal.     Palpations: Abdomen is soft.  Musculoskeletal:     Cervical back: Normal range of motion and neck supple.     Right  lower leg: No edema.     Left lower leg: No edema.  Lymphadenopathy:     Head:     Right side of head: No submental, submandibular, tonsillar, preauricular or posterior auricular adenopathy.     Left side of head: No submental, submandibular, tonsillar, preauricular or posterior auricular adenopathy.     Cervical: No cervical adenopathy.     Upper Body:     Right upper body: Axillary adenopathy (small slightly enlarged to left axilla upper) present.     Left upper body: No axillary adenopathy.  Skin:    General: Skin is warm and dry.  Neurological:     Mental Status: She is alert and oriented to person, place, and time.  Psychiatric:        Attention and Perception: Attention normal.        Mood and Affect: Mood normal.        Behavior: Behavior normal. Behavior is cooperative.        Thought Content: Thought content normal.        Judgment: Judgment normal.    Results for orders placed or performed in visit on 06/09/21  Lactate dehydrogenase  Result Value Ref Range   LDH 122 98 - 192 U/L  Comprehensive metabolic panel  Result Value Ref Range   Sodium 140 135 - 145 mmol/L   Potassium 3.9 3.5 - 5.1 mmol/L   Chloride 105 98 - 111 mmol/L   CO2 29 22 - 32 mmol/L   Glucose, Bld 109 (H) 70 - 99 mg/dL   BUN 16 6 - 20 mg/dL   Creatinine, Ser 0.93 0.44 - 1.00 mg/dL   Calcium 9.2 8.9 - 10.3 mg/dL   Total Protein 6.9 6.5 - 8.1 g/dL   Albumin 3.9 3.5 - 5.0 g/dL   AST 14 (L) 15 - 41 U/L   ALT 14 0 - 44 U/L   Alkaline Phosphatase 84 38 - 126 U/L   Total Bilirubin 0.5 0.3 - 1.2 mg/dL   GFR, Estimated >60 >60 mL/min   Anion gap 6 5 - 15  CBC with Differential  Result Value Ref Range   WBC 6.3 4.0 - 10.5 K/uL   RBC 4.11 3.87 - 5.11 MIL/uL   Hemoglobin 12.0 12.0 - 15.0 g/dL   HCT 37.4 36.0 - 46.0 %   MCV 91.0 80.0 - 100.0 fL   MCH 29.2 26.0 - 34.0 pg   MCHC 32.1 30.0 - 36.0 g/dL   RDW 12.3 11.5 - 15.5 %   Platelets 266 150 - 400 K/uL   nRBC 0.0 0.0 - 0.2 %   Neutrophils Relative  % 62 %   Neutro Abs 4.0 1.7 - 7.7 K/uL   Lymphocytes Relative 28 %   Lymphs Abs 1.8 0.7 - 4.0 K/uL  Monocytes Relative 8 %   Monocytes Absolute 0.5 0.1 - 1.0 K/uL   Eosinophils Relative 1 %   Eosinophils Absolute 0.0 0.0 - 0.5 K/uL   Basophils Relative 1 %   Basophils Absolute 0.0 0.0 - 0.1 K/uL   Immature Granulocytes 0 %   Abs Immature Granulocytes 0.02 0.00 - 0.07 K/uL      Assessment & Plan:   Problem List Items Addressed This Visit       Endocrine   Hypothyroid    Chronic, ongoing.  Continue current medication regimen and adjust as needed based on labs.  Thyroid labs today including antibody testing.  Will obtain imaging thyroid due to poor symptom control.      Relevant Orders   TSH   Thyroid peroxidase antibody   T4, free   US THYROID     Nervous and Auditory   Neuropathy    Ongoing with night sweats, ?menopause vs lymphoma effect -- have reached out to oncology to notify them of patient symptoms.  Increase Gabapentin to 200 MG at night,  determine if increased fatigue with this dose, + daily magnesium.  Recommend Tylenol as needed at home for discomfort and frequent stretching + increased hydration.  Will check B12, CBC, TSH, Mag today.        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  Reached out to them via message in this note to notify them of patient's decreased appetite, fatigue, and night sweats -- she did not alert them of this.  Will await recommendations.  Labs CBC, CMP, TSH today.      Relevant Medications   gabapentin (NEURONTIN) 100 MG capsule   Other Relevant Orders   Magnesium     Other   BMI 29.0-29.9,adult    Followed by weight management provider, recommend she return to see him.  Goal is 40 pounds of weight loss.  Will place referral to weight management provider for further recommendations, discussed with her.  Recommended eating  smaller high protein, low fat meals more frequently and exercising 30 mins a day 5 times a week with a goal of 10-15lb weight loss in the next 3 months. Patient voiced their understanding and motivation to adhere to these recommendations. Check A1c today and recommend she look into injectable coverage with insurance.      Other Visit Diagnoses     Vitamin D deficiency       History of low levels reported, check today and initiate supplement as needed.   Relevant Orders   VITAMIN D 25 Hydroxy (Vit-D Deficiency, Fractures)   B12 deficiency       History of low levels reported with underlying neuropathy.  Check levels today and intiate supplements as needed.   Relevant Orders   CBC with Differential/Platelet   Vitamin B12   IFG (impaired fasting glucose)       Check A1c today.   Relevant Orders   HgB A1c   Encounter for screening for cardiovascular disorders       Lipid panel today.   Relevant Orders   Lipid Panel w/o Chol/HDL Ratio        Follow up plan: Return in about 6 weeks (around 09/03/2021) for Fatigue.

## 2021-07-23 NOTE — Assessment & Plan Note (Signed)
Followed by weight management provider, recommend she return to see him.  Goal is 40 pounds of weight loss.  Will place referral to weight management provider for further recommendations, discussed with her.  Recommended eating smaller high protein, low fat meals more frequently and exercising 30 mins a day 5 times a week with a goal of 10-15lb weight loss in the next 3 months. Patient voiced their understanding and motivation to adhere to these recommendations. Check A1c today and recommend she look into injectable coverage with insurance.

## 2021-07-23 NOTE — Patient Instructions (Signed)
Ozempic and Wegovy are injections we use weekly for weight loss and Kirke Shaggy is one we use daily.  Healthy Eating Following a healthy eating pattern may help you to achieve and maintain a healthy body weight, reduce the risk of chronic disease, and live a long and productive life. It is important to follow a healthy eating pattern at an appropriate calorie level for your body. Your nutritional needs should be met primarily through food by choosing a variety of nutrient-rich foods. What are tips for following this plan? Reading food labels Read labels and choose the following: Reduced or low sodium. Juices with 100% fruit juice. Foods with low saturated fats and high polyunsaturated and monounsaturated fats. Foods with whole grains, such as whole wheat, cracked wheat, brown rice, and wild rice. Whole grains that are fortified with folic acid. This is recommended for women who are pregnant or who want to become pregnant. Read labels and avoid the following: Foods with a lot of added sugars. These include foods that contain brown sugar, corn sweetener, corn syrup, dextrose, fructose, glucose, high-fructose corn syrup, honey, invert sugar, lactose, malt syrup, maltose, molasses, raw sugar, sucrose, trehalose, or turbinado sugar. Do not eat more than the following amounts of added sugar per day: 6 teaspoons (25 g) for women. 9 teaspoons (38 g) for men. Foods that contain processed or refined starches and grains. Refined grain products, such as white flour, degermed cornmeal, white bread, and white rice. Shopping Choose nutrient-rich snacks, such as vegetables, whole fruits, and nuts. Avoid high-calorie and high-sugar snacks, such as potato chips, fruit snacks, and candy. Use oil-based dressings and spreads on foods instead of solid fats such as butter, stick margarine, or cream cheese. Limit pre-made sauces, mixes, and "instant" products such as flavored rice, instant noodles, and ready-made  pasta. Try more plant-protein sources, such as tofu, tempeh, black beans, edamame, lentils, nuts, and seeds. Explore eating plans such as the Mediterranean diet or vegetarian diet. Cooking Use oil to saut or stir-fry foods instead of solid fats such as butter, stick margarine, or lard. Try baking, boiling, grilling, or broiling instead of frying. Remove the fatty part of meats before cooking. Steam vegetables in water or broth. Meal planning  At meals, imagine dividing your plate into fourths: One-half of your plate is fruits and vegetables. One-fourth of your plate is whole grains. One-fourth of your plate is protein, especially lean meats, poultry, eggs, tofu, beans, or nuts. Include low-fat dairy as part of your daily diet. Lifestyle Choose healthy options in all settings, including home, work, school, restaurants, or stores. Prepare your food safely: Wash your hands after handling raw meats. Keep food preparation surfaces clean by regularly washing with hot, soapy water. Keep raw meats separate from ready-to-eat foods, such as fruits and vegetables. Cook seafood, meat, poultry, and eggs to the recommended internal temperature. Store foods at safe temperatures. In general: Keep cold foods at 22F (4.4C) or below. Keep hot foods at 122F (60C) or above. Keep your freezer at [facility] (-17.8C) or below. Foods are no longer safe to eat when they have been between the temperatures of 40-122F (4.4-60C) for more than 2 hours. What foods should I eat? Fruits Aim to eat 2 cup-equivalents of fresh, canned (in natural juice), or frozen fruits each day. Examples of 1 cup-equivalent of fruit include 1 small apple, 8 large strawberries, 1 cup canned fruit,  cup dried fruit, or 1 cup 100% juice. Vegetables Aim to eat 2-3 cup-equivalents of fresh and frozen vegetables each  day, including different varieties and colors. Examples of 1 cup-equivalent of vegetables include 2 medium carrots, 2 cups  raw, leafy greens, 1 cup chopped vegetable (raw or cooked), or 1 medium baked potato. Grains Aim to eat 6 ounce-equivalents of whole grains each day. Examples of 1 ounce-equivalent of grains include 1 slice of bread, 1 cup ready-to-eat cereal, 3 cups popcorn, or  cup cooked rice, pasta, or cereal. Meats and other proteins Aim to eat 5-6 ounce-equivalents of protein each day. Examples of 1 ounce-equivalent of protein include 1 egg, 1/2 cup nuts or seeds, or 1 tablespoon (16 g) peanut butter. A cut of meat or fish that is the size of a deck of cards is about 3-4 ounce-equivalents. Of the protein you eat each week, try to have at least 8 ounces come from seafood. This includes salmon, trout, herring, and anchovies. Dairy Aim to eat 3 cup-equivalents of fat-free or low-fat dairy each day. Examples of 1 cup-equivalent of dairy include 1 cup (240 mL) milk, 8 ounces (250 g) yogurt, 1 ounces (44 g) natural cheese, or 1 cup (240 mL) fortified soy milk. Fats and oils Aim for about 5 teaspoons (21 g) per day. Choose monounsaturated fats, such as canola and olive oils, avocados, peanut butter, and most nuts, or polyunsaturated fats, such as sunflower, corn, and soybean oils, walnuts, pine nuts, sesame seeds, sunflower seeds, and flaxseed. Beverages Aim for six 8-oz glasses of water per day. Limit coffee to three to five 8-oz cups per day. Limit caffeinated beverages that have added calories, such as soda and energy drinks. Limit alcohol intake to no more than 1 drink a day for nonpregnant women and 2 drinks a day for men. One drink equals 12 oz of beer (355 mL), 5 oz of wine (148 mL), or 1 oz of hard liquor (44 mL). Seasoning and other foods Avoid adding excess amounts of salt to your foods. Try flavoring foods with herbs and spices instead of salt. Avoid adding sugar to foods. Try using oil-based dressings, sauces, and spreads instead of solid fats. This information is based on general [place] nutrition  guidelines. For more information, visit [employer]. Exact amounts may vary based on your nutrition needs. Summary A healthy eating plan may help you to maintain a healthy weight, reduce the risk of chronic diseases, and stay active throughout your life. Plan your meals. Make sure you eat the right portions of a variety of nutrient-rich foods. Try baking, boiling, grilling, or broiling instead of frying. Choose healthy options in all settings, including home, work, school, restaurants, or stores. This information is not intended to replace advice given to you by your health care provider. Make sure you discuss any questions you have with your health care provider. Document Revised: 12/19/2017 Document Reviewed: 12/19/2017 Elsevier Patient Education  [employer].

## 2021-07-24 ENCOUNTER — Encounter: Payer: Self-pay | Admitting: Nurse Practitioner

## 2021-07-24 DIAGNOSIS — E559 Vitamin D deficiency, unspecified: Secondary | ICD-10-CM | POA: Insufficient documentation

## 2021-07-24 DIAGNOSIS — E7800 Pure hypercholesterolemia, unspecified: Secondary | ICD-10-CM | POA: Insufficient documentation

## 2021-07-24 LAB — CBC WITH DIFFERENTIAL/PLATELET
Basophils Absolute: 0 10*3/uL (ref 0.0–0.2)
Basos: 1 %
EOS (ABSOLUTE): 0.1 10*3/uL (ref 0.0–0.4)
Eos: 1 %
Hematocrit: 39.9 % (ref 34.0–46.6)
Hemoglobin: 12.9 g/dL (ref 11.1–15.9)
Immature Grans (Abs): 0 10*3/uL (ref 0.0–0.1)
Immature Granulocytes: 0 %
Lymphocytes Absolute: 2.1 10*3/uL (ref 0.7–3.1)
Lymphs: 28 %
MCH: 28.9 pg (ref 26.6–33.0)
MCHC: 32.3 g/dL (ref 31.5–35.7)
MCV: 89 fL (ref 79–97)
Monocytes Absolute: 0.6 10*3/uL (ref 0.1–0.9)
Monocytes: 8 %
Neutrophils Absolute: 4.8 10*3/uL (ref 1.4–7.0)
Neutrophils: 62 %
Platelets: 290 10*3/uL (ref 150–450)
RBC: 4.47 x10E6/uL (ref 3.77–5.28)
RDW: 12.6 % (ref 11.7–15.4)
WBC: 7.5 10*3/uL (ref 3.4–10.8)

## 2021-07-24 LAB — TSH: TSH: 2.98 u[IU]/mL (ref 0.450–4.500)

## 2021-07-24 LAB — LIPID PANEL W/O CHOL/HDL RATIO
Cholesterol, Total: 184 mg/dL (ref 100–199)
HDL: 53 mg/dL (ref >39)
LDL Chol Calc (NIH): 104 mg/dL — ABNORMAL HIGH (ref 0–99)
Triglycerides: 153 mg/dL — ABNORMAL HIGH (ref 0–149)
VLDL Cholesterol Cal: 27 mg/dL (ref 5–40)

## 2021-07-24 LAB — T4, FREE: Free T4: 1.26 ng/dL (ref 0.82–1.77)

## 2021-07-24 LAB — VITAMIN B12: Vitamin B-12: 862 pg/mL (ref 232–1245)

## 2021-07-24 LAB — HEMOGLOBIN A1C
Est. average glucose Bld gHb Est-mCnc: 108 mg/dL
Hgb A1c MFr Bld: 5.4 % (ref 4.8–5.6)

## 2021-07-24 LAB — MAGNESIUM: Magnesium: 2 mg/dL (ref 1.6–2.3)

## 2021-07-24 LAB — VITAMIN D 25 HYDROXY (VIT D DEFICIENCY, FRACTURES): Vit D, 25-Hydroxy: 25.5 ng/mL — ABNORMAL LOW (ref 30.0–100.0)

## 2021-07-24 LAB — THYROID PEROXIDASE ANTIBODY: Thyroperoxidase Ab SerPl-aCnc: 9 IU/mL (ref 0–34)

## 2021-07-24 NOTE — Progress Notes (Signed)
Contacted via MyChart The 10-year ASCVD risk score (Arnett DK, et al., 2019) is: 2.4%   Values used to calculate the score:     Age: 60 years     Sex: Female     Is Non-Hispanic African American: No     Diabetic: No     Tobacco smoker: No     Systolic Blood Pressure: 750 mmHg     Is BP treated: No     HDL Cholesterol: 53 mg/dL     Total Cholesterol: 184 mg/dL   Good afternoon Christina [name], your labs have returned.  Overall they look great.   - Thyroid labs are normal, we will see what ultrasound shows.   - CBC shows no anemia.   - A1c shows no diabetes or prediabetes, I recommend you reach out to insurance about weight loss medications they cover as some may not be covered.   - Magnesium is normal.   - Vitamin D a little low, recommend you start Vitamin D3 2000 units daily for overall bone and muscle health.  You can obtain this over the counter. -  Your LDL is above normal. The LDL is the bad cholesterol. Over time and in combination with inflammation and other factors, this contributes to plaque which in turn may lead to stroke and/or heart attack down the road. Sometimes high LDL is primarily genetic, and people might be eating all the right foods but still have high numbers. Other times, there is room for improvement in one's diet and eating healthier can bring this number down and potentially reduce one's risk of heart attack and/or stroke.   To reduce your LDL, Remember - more fruits and vegetables, more fish, and limit red meat and dairy products. More soy, nuts, beans, barley, lentils, oats and plant sterol ester enriched margarine instead of butter. I also encourage eliminating sugar and processed food. Remember, shop on the outside of the grocery store and visit your [employer]. If you would like to talk with me about dietary changes for your cholesterol, please let me know. We should recheck your cholesterol in 6 months.  Any questions? Keep being awesome!!  Thank you for  allowing me to participate in your care.  I appreciate you. Kindest regards, Lani Havlik

## 2021-07-27 ENCOUNTER — Other Ambulatory Visit: Payer: Self-pay

## 2021-07-27 ENCOUNTER — Inpatient Hospital Stay: Payer: BC Managed Care – PPO | Attending: Oncology | Admitting: Nurse Practitioner

## 2021-07-27 ENCOUNTER — Other Ambulatory Visit: Payer: Self-pay | Admitting: *Deleted

## 2021-07-27 ENCOUNTER — Encounter: Payer: Self-pay | Admitting: Oncology

## 2021-07-27 ENCOUNTER — Inpatient Hospital Stay: Payer: BC Managed Care – PPO

## 2021-07-27 ENCOUNTER — Ambulatory Visit
Admission: RE | Admit: 2021-07-27 | Discharge: 2021-07-27 | Disposition: A | Discharge status: Home / Self Care | Payer: BC Managed Care – PPO | Source: Ambulatory Visit | Attending: Nurse Practitioner | Admitting: Nurse Practitioner

## 2021-07-27 ENCOUNTER — Other Ambulatory Visit: Payer: Self-pay | Admitting: Nurse Practitioner

## 2021-07-27 VITALS — BP 122/86 | HR 81 | Temp 99.0°F | Resp 18

## 2021-07-27 DIAGNOSIS — Z79899 Other long term (current) drug therapy: Secondary | ICD-10-CM | POA: Diagnosis not present

## 2021-07-27 DIAGNOSIS — Z9049 Acquired absence of other specified parts of digestive tract: Secondary | ICD-10-CM | POA: Diagnosis not present

## 2021-07-27 DIAGNOSIS — R61 Generalized hyperhidrosis: Secondary | ICD-10-CM | POA: Insufficient documentation

## 2021-07-27 DIAGNOSIS — R232 Flushing: Secondary | ICD-10-CM

## 2021-07-27 DIAGNOSIS — Z801 Family history of malignant neoplasm of trachea, bronchus and lung: Secondary | ICD-10-CM | POA: Diagnosis not present

## 2021-07-27 DIAGNOSIS — Z8572 Personal history of non-Hodgkin lymphomas: Secondary | ICD-10-CM | POA: Diagnosis not present

## 2021-07-27 DIAGNOSIS — E039 Hypothyroidism, unspecified: Secondary | ICD-10-CM | POA: Insufficient documentation

## 2021-07-27 DIAGNOSIS — C8331 Diffuse large B-cell lymphoma, lymph nodes of head, face, and neck: Secondary | ICD-10-CM

## 2021-07-27 DIAGNOSIS — R630 Anorexia: Secondary | ICD-10-CM | POA: Diagnosis not present

## 2021-07-27 DIAGNOSIS — R6883 Chills (without fever): Secondary | ICD-10-CM | POA: Insufficient documentation

## 2021-07-27 DIAGNOSIS — Z8249 Family history of ischemic heart disease and other diseases of the circulatory system: Secondary | ICD-10-CM | POA: Insufficient documentation

## 2021-07-27 DIAGNOSIS — R590 Localized enlarged lymph nodes: Secondary | ICD-10-CM | POA: Diagnosis not present

## 2021-07-27 DIAGNOSIS — R5383 Other fatigue: Secondary | ICD-10-CM | POA: Diagnosis not present

## 2021-07-27 DIAGNOSIS — M79622 Pain in left upper arm: Secondary | ICD-10-CM | POA: Diagnosis not present

## 2021-07-27 DIAGNOSIS — C8338 Diffuse large B-cell lymphoma, lymph nodes of multiple sites: Secondary | ICD-10-CM | POA: Diagnosis not present

## 2021-07-27 DIAGNOSIS — M7989 Other specified soft tissue disorders: Secondary | ICD-10-CM

## 2021-07-27 DIAGNOSIS — E034 Atrophy of thyroid (acquired): Secondary | ICD-10-CM | POA: Diagnosis not present

## 2021-07-27 LAB — COMPREHENSIVE METABOLIC PANEL
ALT: 16 U/L (ref 0–44)
AST: 18 U/L (ref 15–41)
Albumin: 4.4 g/dL (ref 3.5–5.0)
Alkaline Phosphatase: 96 U/L (ref 38–126)
Anion gap: 7 (ref 5–15)
BUN: 13 mg/dL (ref 6–20)
CO2: 29 mmol/L (ref 22–32)
Calcium: 9.6 mg/dL (ref 8.9–10.3)
Chloride: 104 mmol/L (ref 98–111)
Creatinine, Ser: 0.87 mg/dL (ref 0.44–1.00)
GFR, Estimated: >60 mL/min (ref >60)
Glucose, Bld: 92 mg/dL (ref 70–99)
Potassium: 4.3 mmol/L (ref 3.5–5.1)
Sodium: 140 mmol/L (ref 135–145)
Total Bilirubin: 0.5 mg/dL (ref 0.3–1.2)
Total Protein: 7.7 g/dL (ref 6.5–8.1)

## 2021-07-27 LAB — LACTATE DEHYDROGENASE: LDH: 136 U/L (ref 98–192)

## 2021-07-27 LAB — CBC WITH DIFFERENTIAL/PLATELET
Abs Immature Granulocytes: 0.03 10*3/uL (ref 0.00–0.07)
Basophils Absolute: 0 10*3/uL (ref 0.0–0.1)
Basophils Relative: 0 %
Eosinophils Absolute: 0 10*3/uL (ref 0.0–0.5)
Eosinophils Relative: 0 %
HCT: 41.1 % (ref 36.0–46.0)
Hemoglobin: 13.5 g/dL (ref 12.0–15.0)
Immature Granulocytes: 0 %
Lymphocytes Relative: 23 %
Lymphs Abs: 1.9 10*3/uL (ref 0.7–4.0)
MCH: 29.5 pg (ref 26.0–34.0)
MCHC: 32.8 g/dL (ref 30.0–36.0)
MCV: 89.9 fL (ref 80.0–100.0)
Monocytes Absolute: 0.6 10*3/uL (ref 0.1–1.0)
Monocytes Relative: 7 %
Neutro Abs: 5.7 10*3/uL (ref 1.7–7.7)
Neutrophils Relative %: 70 %
Platelets: 292 10*3/uL (ref 150–400)
RBC: 4.57 MIL/uL (ref 3.87–5.11)
RDW: 12.9 % (ref 11.5–15.5)
WBC: 8.2 10*3/uL (ref 4.0–10.5)
nRBC: 0 % (ref 0.0–0.2)

## 2021-07-27 NOTE — Progress Notes (Signed)
[facility] [place]  [phone] Fax:([phone]  Patient Care Team: Venita Lick, NP as PCP - General (Nurse Practitioner) Sindy Guadeloupe, MD as Consulting Physician (Oncology) Clent Jacks, RN as Oncology Nurse Navigator   Name of the patient: Christina [name]  [id number]  1961/03/05   Date of visit: 07/27/21  Diagnosis- DLBCL  Chief complaint/ Reason for visit- axillary lump, hot flashes, night sweats  Heme/Onc history:  Oncology History   No history exists.    Interval history- Patient is 60 year old female with history of DLBCL who presents for complaints of enlarged lymph node of left axilla. Lump was felt by her pcp. She complains of decreased appetite, hot flashes, and fatigue. Her PCP recommended she see [place] for evaluation. She has not yet had a mammogram this year but is due. She denies breast lumps or bumps. She has been NED since completing treatment in 2018.   ECOG FS:1 - Symptomatic but completely ambulatory  Review of systems- Review of Systems  Constitutional:  Positive for chills and malaise/fatigue. Negative for diaphoresis, fever and weight loss.  HENT:  Negative for hearing loss, nosebleeds, sore throat and tinnitus.   Eyes:  Negative for blurred vision and double vision.  Respiratory:  Negative for cough, hemoptysis, shortness of breath and wheezing.   Cardiovascular:  Negative for chest pain, palpitations and leg swelling.  Gastrointestinal:  Negative for abdominal pain, blood in stool, constipation, diarrhea, melena, nausea and vomiting.  Genitourinary:  Negative for dysuria and urgency.  Musculoskeletal:  Negative for back pain, falls, joint pain and myalgias.  Skin:  Negative for itching and rash.  Neurological:  Negative for dizziness, tingling, sensory change, loss of consciousness, weakness and headaches.  Endo/Heme/Allergies:  Negative for environmental allergies. Does not bruise/bleed  easily.  Psychiatric/Behavioral:  Negative for depression. The patient is nervous/anxious. The patient does not have insomnia.     No Known Allergies  Past Medical History:  Diagnosis Date   B-cell lymphoma ([place])    Cancer ([place]) 2018   lymphoma, non hodgekins B cell   Chest pain 12/08/2016   Chest pain 12/08/2016   Dehydration 12/08/2016   GERD (gastroesophageal reflux disease)    Hypothyroidism     Past Surgical History:  Procedure Laterality Date   [place]   x 2    CHOLECYSTECTOMY N/A 10/04/2019   Procedure: LAPAROSCOPIC CHOLECYSTECTOMY;  Surgeon: Olean Ree, MD;  Location: [facility] ORS;  Service: General;  Laterality: N/A;   COLONOSCOPY WITH PROPOFOL N/A 09/28/2017   Procedure: COLONOSCOPY WITH PROPOFOL;  Surgeon: Jonathon Bellows, MD;  Location: [facility] ENDOSCOPY;  Service: Gastroenterology;  Laterality: N/A;   ESOPHAGOGASTRODUODENOSCOPY (EGD) WITH PROPOFOL N/A 11/11/2016   Procedure: ESOPHAGOGASTRODUODENOSCOPY (EGD) WITH PROPOFOL;  Surgeon: Jonathon Bellows, MD;  Location: [facility] ENDOSCOPY;  Service: Endoscopy;  Laterality: N/A;   IR GENERIC HISTORICAL  11/03/2016   IR FLUORO GUIDE PORT INSERTION RIGHT 11/03/2016 Aletta Edouard, MD [facility]-INTERV RAD   IR REMOVAL TUN ACCESS W/ PORT W/O FL MOD SED  02/15/2017   REFRACTIVE SURGERY  09/2015    Social History   Socioeconomic History   Marital status: Single    Spouse name: Not on file   Number of children: Not on file   Years of education: Not on file   Highest education level: Not on file  Occupational History   Not on file  Tobacco Use   Smoking status: Never  Smokeless tobacco: Never  Vaping Use   Vaping Use: Never used  Substance and Sexual Activity   Alcohol use: No   Drug use: No   Sexual activity: Yes  Other Topics Concern   Not on file  Social History Narrative   Not on file   Social Determinants of Health   Financial Resource Strain: Not on file  Food Insecurity: Not on file   Transportation Needs: Not on file  Physical Activity: Not on file  Stress: Not on file  Social Connections: Not on file  Intimate Partner Violence: Not on file    Family History  Problem Relation Age of Onset   Cancer Mother        breast   Cancer Father        lung   Heart disease Brother 11       MI   Heart disease Paternal Grandfather        MI     Current Outpatient Medications:    gabapentin (NEURONTIN) 100 MG capsule, Take 2 capsules (200 mg total) by mouth at bedtime., Disp: 180 capsule, Rfl: 4   levothyroxine (SYNTHROID) 50 MCG tablet, TAKE 1 TABLET BY MOUTH EVERY DAY, Disp: 90 tablet, Rfl: 1  Physical exam:  Vitals:   07/27/21 1429  BP: 122/86  Pulse: 81  Resp: 18  Temp: 99 F (37.2 C)  TempSrc: Tympanic  SpO2: 99%   Physical Exam Constitutional:      General: She is not in acute distress.    Appearance: She is well-developed. She is not ill-appearing.  HENT:     Head: Atraumatic.     Nose: Nose normal.     Mouth/Throat:     Pharynx: No oropharyngeal exudate.  Eyes:     General: No scleral icterus.    Conjunctiva/sclera: Conjunctivae normal.  Cardiovascular:     Rate and Rhythm: Normal rate and regular rhythm.  Pulmonary:     Effort: Pulmonary effort is normal.     Breath sounds: Normal breath sounds.  Abdominal:     General: Bowel sounds are normal. There is no distension.     Palpations: Abdomen is soft.     Tenderness: There is no abdominal tenderness.  Musculoskeletal:        General: No tenderness or deformity. Normal range of motion.     Cervical back: Normal range of motion and neck supple.  Lymphadenopathy:     Head:     Right side of head: No submental, submandibular, tonsillar, preauricular, posterior auricular or occipital adenopathy.     Left side of head: No submental, submandibular, tonsillar, preauricular, posterior auricular or occipital adenopathy.     Cervical: No cervical adenopathy.     Right cervical: No superficial, deep  or posterior cervical adenopathy.    Left cervical: No superficial, deep or posterior cervical adenopathy.     Upper Body:     Right upper body: No supraclavicular, axillary, pectoral or epitrochlear adenopathy.     Left upper body: No supraclavicular, axillary, pectoral or epitrochlear adenopathy.     Lower Body: No right inguinal adenopathy. No left inguinal adenopathy.  Skin:    General: Skin is warm and dry.     Coloration: Skin is not pale.  Neurological:     General: No focal deficit present.     Mental Status: She is alert and oriented to person, place, and time.  Psychiatric:        Mood and Affect: Mood normal.  Behavior: Behavior normal.     CMP Latest Ref Rng & Units 07/27/2021  Glucose 70 - 99 mg/dL 92  BUN 6 - 20 mg/dL 13  Creatinine 0.44 - 1.00 mg/dL 0.87  Sodium 135 - 145 mmol/L 140  Potassium 3.5 - 5.1 mmol/L 4.3  Chloride 98 - 111 mmol/L 104  CO2 22 - 32 mmol/L 29  Calcium 8.9 - 10.3 mg/dL 9.6  Total Protein 6.5 - 8.1 g/dL 7.7  Total Bilirubin 0.3 - 1.2 mg/dL 0.5  Alkaline Phos 38 - 126 U/L 96  AST 15 - 41 U/L 18  ALT 0 - 44 U/L 16   CBC Latest Ref Rng & Units 07/27/2021  WBC 4.0 - 10.5 K/uL 8.2  Hemoglobin 12.0 - 15.0 g/dL 13.5  Hematocrit 36.0 - 46.0 % 41.1  Platelets 150 - 400 K/uL 292    No images are attached to the encounter.  [place] LT UPPER EXTREM LTD SOFT TISSUE NON VASCULAR  Result Date: 07/27/2021 CLINICAL DATA:  Left axillary pain and swelling. History of lymphoma in 2018 EXAM: ULTRASOUND LEFT UPPER EXTREMITY LIMITED TECHNIQUE: Ultrasound examination of the upper extremity soft tissues was performed in the area of clinical concern. COMPARISON:  PET-CT 11/08/2016 FINDINGS: Targeted ultrasound was performed of the soft tissues of the left axilla at site of patient's clinical concern. There are a few small morphologically normal appearing lymph nodes within the left axilla with reniform shape, uniform thin cortex, and preserved fatty hila. No  abnormal solid or cystic mass. No edema or fluid collection. IMPRESSION: No lymphadenopathy or other sonographic abnormality identified within the left axilla. Electronically Signed   By: Davina Poke D.O.   On: 07/27/2021 13:44    Assessment and plan- Patient is a 60 y.o. female with history of DLBCL, completed treatment in 2018, who presents to [facility] for adenopathy, malaise, appetite changes, and night sweats. No palpable adenopathy on exam. LDH normal and within her baseline. Ultrasound was negative for lymphadenopathy. Findings reassuring. Recommend diagnostic mammogram of left breast, screening right. Will check estradiol, fsh, lh to confirm menopausal status in setting of hot flashes and sweats. Consistent with post-menopausal. If mammogram negative, follow up with Dr. Janese Banks for continued surveillance. If symptoms worsen in the interim, return sooner for further evaluation. Would consider CT at that time.    Visit Diagnosis 1. Left axillary pain   2. Hot flashes   3. Hx of lymphoma    Patient expressed understanding and was in agreement with this plan. She also understands that She can call clinic at any time with any questions, concerns, or complaints.   Thank you for allowing me to participate in the care of this very pleasant patient.   Beckey Rutter, DNP, AGNP-[facility]  CC: Marnee Guarneri, NP

## 2021-07-27 NOTE — Telephone Encounter (Signed)
Pt came today and was seen by dr Janese Banks

## 2021-07-27 NOTE — Progress Notes (Signed)
Pt reports that her pcp palpitated a lymph node under left axilla. She states that area is painful, and she is very concerned about it. Also reporting night sweats.

## 2021-07-27 NOTE — Progress Notes (Signed)
Contacted via MyChart   Good evening Christina [name], I have good news your thyroid ultrasound returned showing no nodules.  It does show thyroid disease, which we are treating, any questions? Keep being incredible!!  Thank you for allowing me to participate in your care.  I appreciate you. Kindest regards, Chibuike Fleek

## 2021-07-28 ENCOUNTER — Ambulatory Visit: Payer: BC Managed Care – PPO

## 2021-07-28 ENCOUNTER — Encounter: Payer: Self-pay | Admitting: Nurse Practitioner

## 2021-07-28 LAB — ESTRADIOL: Estradiol: <5 pg/mL

## 2021-07-28 LAB — FSH/LH
FSH: 118 m[IU]/mL
LH: 57.3 m[IU]/mL

## 2021-07-29 ENCOUNTER — Other Ambulatory Visit: Payer: BC Managed Care – PPO

## 2021-07-30 ENCOUNTER — Encounter: Payer: Self-pay | Admitting: Internal Medicine

## 2021-08-17 ENCOUNTER — Other Ambulatory Visit: Payer: Self-pay

## 2021-08-17 ENCOUNTER — Ambulatory Visit
Admission: RE | Admit: 2021-08-17 | Discharge: 2021-08-17 | Disposition: A | Discharge status: Home / Self Care | Payer: BC Managed Care – PPO | Source: Ambulatory Visit | Attending: Oncology | Admitting: Oncology

## 2021-08-17 ENCOUNTER — Other Ambulatory Visit: Payer: Self-pay | Admitting: Oncology

## 2021-08-17 DIAGNOSIS — M7989 Other specified soft tissue disorders: Secondary | ICD-10-CM | POA: Insufficient documentation

## 2021-08-17 DIAGNOSIS — M79622 Pain in left upper arm: Secondary | ICD-10-CM

## 2021-08-17 DIAGNOSIS — N6489 Other specified disorders of breast: Secondary | ICD-10-CM | POA: Diagnosis not present

## 2021-08-17 DIAGNOSIS — C8331 Diffuse large B-cell lymphoma, lymph nodes of head, face, and neck: Secondary | ICD-10-CM

## 2021-08-17 DIAGNOSIS — R922 Inconclusive mammogram: Secondary | ICD-10-CM | POA: Diagnosis not present

## 2021-09-03 ENCOUNTER — Encounter: Payer: Self-pay | Admitting: Nurse Practitioner

## 2021-09-03 ENCOUNTER — Ambulatory Visit: Payer: BC Managed Care – PPO | Admitting: Nurse Practitioner

## 2021-09-23 ENCOUNTER — Telehealth: Payer: Self-pay | Admitting: Nurse Practitioner

## 2021-09-23 ENCOUNTER — Other Ambulatory Visit: Payer: Self-pay | Admitting: Nurse Practitioner

## 2021-09-23 DIAGNOSIS — M7989 Other specified soft tissue disorders: Secondary | ICD-10-CM

## 2021-09-23 NOTE — Telephone Encounter (Signed)
Reviewed mammograma nd bloodwork results with patient. Symptoms of swelling have resolved. Bloodwork consistent with postmenopausal. She denies complaints today and feels well. Follow up with Dr. Janese Banks as scheduled.

## 2021-09-23 NOTE — Telephone Encounter (Signed)
Requested Prescriptions  Pending Prescriptions Disp Refills   levothyroxine (SYNTHROID) 50 MCG tablet [Pharmacy Med Name: LEVOTHYROXINE 50 MCG TABLET] 90 tablet 1    Sig: TAKE 1 TABLET BY MOUTH EVERY DAY     Endocrinology:  Hypothyroid Agents Failed - 09/23/2021  1:19 AM      Failed - TSH needs to be rechecked within 3 months after an abnormal result. Refill until TSH is due.      Passed - TSH in normal range and within 360 days    TSH  Date Value Ref Range Status  07/23/2021 2.980 0.450 - 4.500 uIU/mL Final         Passed - Valid encounter within last 12 months    Recent Outpatient Visits          2 months ago 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[place])   [place], Jolene T, NP   7 months ago Non-recurrent acute serous otitis media of right ear   [place], Jolene T, NP   7 months ago Non-recurrent acute serous otitis media of right ear   [place], Jolene T, NP   8 months ago Lab test positive for detection of COVID-19 virus   [facility] [place], [place] T, NP   1 year ago 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[place])   [place], Barbaraann Faster, NP

## 2021-11-30 ENCOUNTER — Other Ambulatory Visit: Payer: Self-pay | Admitting: *Deleted

## 2021-11-30 DIAGNOSIS — C8331 Diffuse large B-cell lymphoma, lymph nodes of head, face, and neck: Secondary | ICD-10-CM

## 2021-12-07 ENCOUNTER — Inpatient Hospital Stay: Payer: BC Managed Care – PPO | Attending: Oncology

## 2021-12-07 ENCOUNTER — Inpatient Hospital Stay: Payer: BC Managed Care – PPO | Admitting: Oncology

## 2021-12-07 ENCOUNTER — Other Ambulatory Visit: Payer: Self-pay

## 2021-12-07 ENCOUNTER — Encounter: Payer: Self-pay | Admitting: Oncology

## 2021-12-07 VITALS — BP 131/94 | HR 76 | Temp 96.7°F | Resp 16 | Ht 66.0 in | Wt 183.7 lb

## 2021-12-07 DIAGNOSIS — Z08 Encounter for follow-up examination after completed treatment for malignant neoplasm: Secondary | ICD-10-CM

## 2021-12-07 DIAGNOSIS — Z79899 Other long term (current) drug therapy: Secondary | ICD-10-CM | POA: Diagnosis not present

## 2021-12-07 DIAGNOSIS — C8331 Diffuse large B-cell lymphoma, lymph nodes of head, face, and neck: Secondary | ICD-10-CM

## 2021-12-07 DIAGNOSIS — Z8042 Family history of malignant neoplasm of prostate: Secondary | ICD-10-CM | POA: Insufficient documentation

## 2021-12-07 DIAGNOSIS — Z803 Family history of malignant neoplasm of breast: Secondary | ICD-10-CM | POA: Diagnosis not present

## 2021-12-07 DIAGNOSIS — Z9049 Acquired absence of other specified parts of digestive tract: Secondary | ICD-10-CM | POA: Diagnosis not present

## 2021-12-07 DIAGNOSIS — R232 Flushing: Secondary | ICD-10-CM | POA: Diagnosis not present

## 2021-12-07 DIAGNOSIS — Z801 Family history of malignant neoplasm of trachea, bronchus and lung: Secondary | ICD-10-CM | POA: Diagnosis not present

## 2021-12-07 DIAGNOSIS — Z8579 Personal history of other malignant neoplasms of lymphoid, hematopoietic and related tissues: Secondary | ICD-10-CM

## 2021-12-07 DIAGNOSIS — C8338 Diffuse large B-cell lymphoma, lymph nodes of multiple sites: Secondary | ICD-10-CM | POA: Insufficient documentation

## 2021-12-07 DIAGNOSIS — E039 Hypothyroidism, unspecified: Secondary | ICD-10-CM | POA: Diagnosis not present

## 2021-12-07 DIAGNOSIS — Z8249 Family history of ischemic heart disease and other diseases of the circulatory system: Secondary | ICD-10-CM | POA: Diagnosis not present

## 2021-12-07 LAB — CBC WITH DIFFERENTIAL/PLATELET
Abs Immature Granulocytes: 0.03 10*3/uL (ref 0.00–0.07)
Basophils Absolute: 0.1 10*3/uL (ref 0.0–0.1)
Basophils Relative: 1 %
Eosinophils Absolute: 0 10*3/uL (ref 0.0–0.5)
Eosinophils Relative: 1 %
HCT: 41.7 % (ref 36.0–46.0)
Hemoglobin: 13.5 g/dL (ref 12.0–15.0)
Immature Granulocytes: 0 %
Lymphocytes Relative: 25 %
Lymphs Abs: 1.9 10*3/uL (ref 0.7–4.0)
MCH: 29.1 pg (ref 26.0–34.0)
MCHC: 32.4 g/dL (ref 30.0–36.0)
MCV: 89.9 fL (ref 80.0–100.0)
Monocytes Absolute: 0.5 10*3/uL (ref 0.1–1.0)
Monocytes Relative: 7 %
Neutro Abs: 5.1 10*3/uL (ref 1.7–7.7)
Neutrophils Relative %: 66 %
Platelets: 272 10*3/uL (ref 150–400)
RBC: 4.64 MIL/uL (ref 3.87–5.11)
RDW: 12.7 % (ref 11.5–15.5)
WBC: 7.7 10*3/uL (ref 4.0–10.5)
nRBC: 0 % (ref 0.0–0.2)

## 2021-12-07 LAB — COMPREHENSIVE METABOLIC PANEL
ALT: 16 U/L (ref 0–44)
AST: 20 U/L (ref 15–41)
Albumin: 4.1 g/dL (ref 3.5–5.0)
Alkaline Phosphatase: 96 U/L (ref 38–126)
Anion gap: 8 (ref 5–15)
BUN: 16 mg/dL (ref 6–20)
CO2: 25 mmol/L (ref 22–32)
Calcium: 9.2 mg/dL (ref 8.9–10.3)
Chloride: 104 mmol/L (ref 98–111)
Creatinine, Ser: 0.89 mg/dL (ref 0.44–1.00)
GFR, Estimated: >60 mL/min (ref >60)
Glucose, Bld: 109 mg/dL — ABNORMAL HIGH (ref 70–99)
Potassium: 4 mmol/L (ref 3.5–5.1)
Sodium: 137 mmol/L (ref 135–145)
Total Bilirubin: 0.2 mg/dL — ABNORMAL LOW (ref 0.3–1.2)
Total Protein: 7.2 g/dL (ref 6.5–8.1)

## 2021-12-07 LAB — LACTATE DEHYDROGENASE: LDH: 136 U/L (ref 98–192)

## 2021-12-07 NOTE — Progress Notes (Signed)
Hot flashes still is a problem ?

## 2021-12-07 NOTE — Progress Notes (Signed)
? ? ? ?Hematology/Oncology Consult note ?[place]  ?[phone] [phone] ? ?Patient Care Team: ?Venita Lick, NP as PCP - General (Nurse Practitioner) ?Sindy Guadeloupe, MD as Consulting Physician (Oncology) ?Clent Jacks, RN as Oncology Nurse Navigator  ? ?Name of the patient: Christina [name]  ?[id number]  ?[birth year]  ? ?Date of visit: 12/07/21 ? 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 ?Chief complaint/ Reason for visit-routine follow-up of diffuse large B-cell lymphoma ? ?Heme/Onc history: 1. Patient is a 61 year old female who presented to her primary care doctor with symptoms of right neck swelling in January 2018. Her symptoms of an ongoing since December 2017. Patient works at a [employer] (medial in the swelling but the swelling did not go away. She was then referred to ENT.  ?  ?2. CT of the soft tissue neck with contrast showed malignant right leg lymphadenopathy with heterogeneous enhancement and extracapsular extension occupying both the right level II and level III nodal stations. Individually B abnormal lymph nodes measure up to 4.2 cm in largest dimension and the conglomerulation of abnormal lymph nodes is 5-5.5 cm across. There is a small but asymmetric and conspicuity 7 mm lymph node along the lower right 3-B station. Mass effect on the right carotid space from the abnormal nodes but the major vascular structures in the neck at the skull base including the right IJ remained patent. ?  ?3. 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 Cells were CD20 positive and BCL 6 and partially dim BCL-2 positive. B cells were negative for C myc (10%). Mom one was positive in 50% of the cells. Ki-67 was 50%.Bone marrow biopsy was negative for lymphoma involvement ?  ?4.  PET/CT showed IMPRESSION: ?1. FDG avid malignancy is  identified in the level 2 and 3 nodal ?stations of the right cervical lymph node chain, consistent with the ?patient's known lymphoma. ?2. Focal uptake in the gastric cardia is suspicious for malignancy ?as well. Recommend direct visualization and biopsy as clinically ?Warranted. ?  ?5. EGD showed diffuse moderately erythematous mucosa without bleeding initial nonbleeding gastric ulcer 6 mm which was biopsied. Findings were consistent with H. pylori gastritis and patient was started on antibiotic therapy for the same. Repeat H pylori testing negative ?  ?6. EUS also showed H pylori. There were few B cells noted in lamina propria and clonality studies have been ordered. Initial testing for B cell clonality showed no monoclonal B-cell population. Confirmation testing also did not reveal clonal B cells ?  ?7. PET/CT scan after cycles of RCHOP showed: IMPRESSION: ?1. Complete metabolic response.  No metabolically active lymphoma. ?2. Uniform low-level marrow hypermetabolism throughout the axial ?skeleton, compatible with reactive marrow state due to chemotherapy. ?3. No residual gastric hypermetabolism ?  ?8. She completed 3 cycles of RCHOP between feb 2018-aoril 2018 followed by IFRT to her neck in June 2018 ?  ? ?Interval history-she is doing well overall.  She continues to have intermittent hot flashes for which she is on gabapentin 200 mg daily.  Appetite and weight have remained stable. ? ?ECOG PS- 0 ?Pain scale- 0 ? ?Review of systems- Review of Systems  ?Constitutional:  Negative for chills, fever, malaise/fatigue and weight loss.  ?HENT:  Negative for congestion, ear discharge and nosebleeds.   ?Eyes:  Negative for blurred vision.  ?Respiratory:  Negative for cough, hemoptysis,  sputum production, shortness of breath and wheezing.   ?Cardiovascular:  Negative for chest pain, palpitations, orthopnea and claudication.  ?Gastrointestinal:  Negative for abdominal pain, blood in stool, constipation, diarrhea,  heartburn, melena, nausea and vomiting.  ?Genitourinary:  Negative for dysuria, flank pain, frequency, hematuria and urgency.  ?Musculoskeletal:  Negative for back pain, joint pain and myalgias.  ?Skin:  Negative for rash.  ?Neurological:  Negative for dizziness, tingling, focal weakness, seizures, weakness and headaches.  ?Endo/Heme/Allergies:  Does not bruise/bleed easily.  ?     Hot flashes  ?Psychiatric/Behavioral:  Negative for depression and suicidal ideas. The patient does not have insomnia.    ? ? ?No Known Allergies ? ? ?Past Medical History:  ?Diagnosis Date  ? B-cell lymphoma ([place])   ? Cancer ([place]) 2018  ? lymphoma, non hodgekins B cell  ? Chest pain 12/08/2016  ? Chest pain 12/08/2016  ? Dehydration 12/08/2016  ? GERD (gastroesophageal reflux disease)   ? Hypothyroidism   ? ? ? ?Past Surgical History:  ?Procedure Laterality Date  ? ABDOMINAL HYSTERECTOMY    ? [place]  ? x 2   ? CHOLECYSTECTOMY N/A 10/04/2019  ? Procedure: LAPAROSCOPIC CHOLECYSTECTOMY;  Surgeon: Olean Ree, MD;  Location: [facility] ORS;  Service: General;  Laterality: N/A;  ? COLONOSCOPY WITH PROPOFOL N/A 09/28/2017  ? Procedure: COLONOSCOPY WITH PROPOFOL;  Surgeon: Jonathon Bellows, MD;  Location: [facility] ENDOSCOPY;  Service: Gastroenterology;  Laterality: N/A;  ? ESOPHAGOGASTRODUODENOSCOPY (EGD) WITH PROPOFOL N/A 11/11/2016  ? Procedure: ESOPHAGOGASTRODUODENOSCOPY (EGD) WITH PROPOFOL;  Surgeon: Jonathon Bellows, MD;  Location: [facility] ENDOSCOPY;  Service: Endoscopy;  Laterality: N/A;  ? IR GENERIC HISTORICAL  11/03/2016  ? IR FLUORO GUIDE PORT INSERTION RIGHT 11/03/2016 Aletta Edouard, MD [facility]-INTERV RAD  ? IR REMOVAL TUN ACCESS W/ PORT W/O FL MOD SED  02/15/2017  ? REFRACTIVE SURGERY  09/2015  ? ? ?Social History  ? ?Socioeconomic History  ? Marital status: Single  ?  Spouse name: Not on file  ? Number of children: Not on file  ? Years of education: Not on file  ? Highest education level: Not on file  ?Occupational History  ? Not on file   ?Tobacco Use  ? Smoking status: Never  ? Smokeless tobacco: Never  ?Vaping Use  ? Vaping Use: Never used  ?Substance and Sexual Activity  ? Alcohol use: No  ? Drug use: No  ? Sexual activity: Yes  ?Other Topics Concern  ? Not on file  ?Social History Narrative  ? Not on file  ? ?Social Determinants of Health  ? ?Financial Resource Strain: Not on file  ?Food Insecurity: Not on file  ?Transportation Needs: Not on file  ?Physical Activity: Not on file  ?Stress: Not on file  ?Social Connections: Not on file  ?Intimate Partner Violence: Not on file  ? ? ?Family History  ?Problem Relation Age of Onset  ? Breast cancer Mother 64  ? Cancer Mother   ?     breast  ? Cancer Father   ?     lung  ? Heart disease Brother 93  ?     MI  ? Prostate cancer Brother   ? Heart disease Paternal Grandfather   ?     MI  ? ? ? ?Current Outpatient Medications:  ?  gabapentin (NEURONTIN) 100 MG capsule, Take 2 capsules (200 mg total) by mouth at bedtime., Disp: 180 capsule, Rfl: 4 ?  levothyroxine (SYNTHROID) 50 MCG tablet, TAKE 1 TABLET  BY MOUTH EVERY DAY, Disp: 90 tablet, Rfl: 3 ? ?Physical exam:  ?Vitals:  ? 12/07/21 1048  ?BP: (!) 131/94  ?Pulse: 76  ?Resp: 16  ?Temp: (!) 96.7 ?F (35.9 ?C)  ?TempSrc: Tympanic  ?Weight: 183 lb 11.2 oz (83.3 kg)  ?Height: 5' 6" (1.676 m)  ? ?Physical Exam ?Cardiovascular:  ?   Rate and Rhythm: Normal rate and regular rhythm.  ?   Heart sounds: Normal heart sounds.  ?Pulmonary:  ?   Effort: Pulmonary effort is normal.  ?   Breath sounds: Normal breath sounds.  ?Abdominal:  ?   General: Bowel sounds are normal.  ?   Palpations: Abdomen is soft.  ?Lymphadenopathy:  ?   Comments: No palpable cervical, supraclavicular, axillary or inguinal adenopathy ? ?  ?Skin: ?   General: Skin is warm and dry.  ?Neurological:  ?   Mental Status: She is alert and oriented to person, place, and time.  ?  ? ?CMP Latest Ref Rng & Units 12/07/2021  ?Glucose 70 - 99 mg/dL 109(H)  ?BUN 6 - 20 mg/dL 16  ?Creatinine 0.44 - 1.00 mg/dL  0.89  ?Sodium 135 - 145 mmol/L 137  ?Potassium 3.5 - 5.1 mmol/L 4.0  ?Chloride 98 - 111 mmol/L 104  ?CO2 22 - 32 mmol/L 25  ?Calcium 8.9 - 10.3 mg/dL 9.2  ?Total Protein 6.5 - 8.1 g/dL 7.2  ?Total Bilirubin 0.3 - 1.2 mg

## 2021-12-11 ENCOUNTER — Emergency Department
Admission: EM | Admit: 2021-12-11 | Discharge: 2021-12-11 | Disposition: A | Discharge status: Home / Self Care | Payer: BC Managed Care – PPO | Attending: Emergency Medicine | Admitting: Emergency Medicine

## 2021-12-11 ENCOUNTER — Other Ambulatory Visit: Payer: Self-pay

## 2021-12-11 DIAGNOSIS — Z8572 Personal history of non-Hodgkin lymphomas: Secondary | ICD-10-CM | POA: Diagnosis not present

## 2021-12-11 DIAGNOSIS — R112 Nausea with vomiting, unspecified: Secondary | ICD-10-CM | POA: Diagnosis not present

## 2021-12-11 DIAGNOSIS — E039 Hypothyroidism, unspecified: Secondary | ICD-10-CM | POA: Insufficient documentation

## 2021-12-11 DIAGNOSIS — R1084 Generalized abdominal pain: Secondary | ICD-10-CM | POA: Diagnosis not present

## 2021-12-11 DIAGNOSIS — E860 Dehydration: Secondary | ICD-10-CM | POA: Insufficient documentation

## 2021-12-11 DIAGNOSIS — R6883 Chills (without fever): Secondary | ICD-10-CM | POA: Diagnosis not present

## 2021-12-11 DIAGNOSIS — R197 Diarrhea, unspecified: Secondary | ICD-10-CM | POA: Insufficient documentation

## 2021-12-11 LAB — COMPREHENSIVE METABOLIC PANEL
ALT: 22 U/L (ref 0–44)
AST: 19 U/L (ref 15–41)
Albumin: 4.1 g/dL (ref 3.5–5.0)
Alkaline Phosphatase: 104 U/L (ref 38–126)
Anion gap: 9 (ref 5–15)
BUN: 15 mg/dL (ref 6–20)
CO2: 25 mmol/L (ref 22–32)
Calcium: 9.6 mg/dL (ref 8.9–10.3)
Chloride: 105 mmol/L (ref 98–111)
Creatinine, Ser: 1.1 mg/dL — ABNORMAL HIGH (ref 0.44–1.00)
GFR, Estimated: 58 mL/min — ABNORMAL LOW (ref >60)
Glucose, Bld: 136 mg/dL — ABNORMAL HIGH (ref 70–99)
Potassium: 4.4 mmol/L (ref 3.5–5.1)
Sodium: 139 mmol/L (ref 135–145)
Total Bilirubin: 0.6 mg/dL (ref 0.3–1.2)
Total Protein: 8.1 g/dL (ref 6.5–8.1)

## 2021-12-11 LAB — CBC
HCT: 45.3 % (ref 36.0–46.0)
Hemoglobin: 14.3 g/dL (ref 12.0–15.0)
MCH: 28.7 pg (ref 26.0–34.0)
MCHC: 31.6 g/dL (ref 30.0–36.0)
MCV: 90.8 fL (ref 80.0–100.0)
Platelets: 250 10*3/uL (ref 150–400)
RBC: 4.99 MIL/uL (ref 3.87–5.11)
RDW: 12.7 % (ref 11.5–15.5)
WBC: 11.2 10*3/uL — ABNORMAL HIGH (ref 4.0–10.5)
nRBC: 0 % (ref 0.0–0.2)

## 2021-12-11 LAB — LIPASE, BLOOD: Lipase: 24 U/L (ref 11–51)

## 2021-12-11 MED ORDER — ONDANSETRON 4 MG PO TBDP: 4.0000 mg | ORAL_TABLET | Freq: Three times a day (TID) | ORAL | 0 refills | Status: DC | PRN

## 2021-12-11 MED ORDER — ONDANSETRON HCL 4 MG/2ML IJ SOLN
4.0000 mg | Freq: Once | INTRAMUSCULAR | Status: AC
  Administered 2021-12-11: 4 mg via INTRAVENOUS
  Filled 2021-12-11: qty 2

## 2021-12-11 MED ORDER — KETOROLAC TROMETHAMINE 15 MG/ML IJ SOLN
15.0000 mg | Freq: Once | INTRAMUSCULAR | Status: AC
Start: 2021-12-11 — End: 2021-12-11
  Administered 2021-12-11: 15 mg via INTRAVENOUS
  Filled 2021-12-11: qty 1

## 2021-12-11 MED ORDER — LACTATED RINGERS IV BOLUS
1000.0000 mL | Freq: Once | INTRAVENOUS | Status: AC
Start: 2021-12-11 — End: 2021-12-11
  Administered 2021-12-11: 1000 mL via INTRAVENOUS

## 2021-12-11 MED ORDER — PANTOPRAZOLE SODIUM 40 MG IV SOLR
40.0000 mg | Freq: Once | INTRAVENOUS | Status: AC
Start: 2021-12-11 — End: 2021-12-11
  Administered 2021-12-11: 40 mg via INTRAVENOUS
  Filled 2021-12-11: qty 10

## 2021-12-11 NOTE — ED Triage Notes (Signed)
Patient to ER via [facility] with complaints of NVD that started this morning around 2am. Patient also reports cold chills. ?

## 2021-12-11 NOTE — ED Notes (Signed)
76 yof with a c/c of vomiting, fever, chills, and diarrhea since 2 am this morning.  ?

## 2021-12-11 NOTE — Discharge Instructions (Signed)
Take famotidine 20 mg 2 times a day to help soothe your stomach.  Take naproxen 500 mg 2 times a day or ibuprofen 600 mg every 6 hours for pain. ?

## 2021-12-11 NOTE — ED Provider Notes (Signed)
? ?[facility] ?Provider Note ? ? ? Event Date/Time  ? First MD Initiated Contact with Patient 12/11/21 1321   ?  (approximate) ? ? ?History  ? ?Emesis ? ? ?HPI ? ?DORTHEY DEPACE is a 61 y.o. female with a past history of B-cell lymphoma with completed treatment, hypothyroidism who comes ED for complaining of nausea vomiting diarrhea that started at 2 AM this morning, associated with chills.  She had multiple episodes at home.  Associated with generalized abdominal aching pain which is nonradiating, no aggravating or alleviating factors.  No syncope or fever. ? ?She is a dinner at [employer] last night, had no symptoms as of bedtime.  No sick contacts. ? ? ?Physical Exam  ? ?Triage Vital Signs: ?ED Triage Vitals  ?Enc Vitals Group  ?   BP 12/11/21 1318 122/78  ?   Pulse Rate 12/11/21 1316 (!) 108  ?   Resp 12/11/21 1316 16  ?   Temp 12/11/21 1316 98.2 ?F (36.8 ?C)  ?   Temp Source 12/11/21 1316 Oral  ?   SpO2 12/11/21 1316 94 %  ?   Weight 12/11/21 1317 183 lb (83 kg)  ?   Height 12/11/21 1317 '5\' 6"'$  (1.676 m)  ?   Head Circumference --   ?   Peak Flow --   ?   Pain Score 12/11/21 1316 8  ?   Pain Loc --   ?   Pain Edu? --   ?   Excl. in [place]? --   ? ? ?Most recent vital signs: ?Vitals:  ? 12/11/21 1316 12/11/21 1318  ?BP:  122/78  ?Pulse: (!) 108   ?Resp: 16   ?Temp: 98.2 ?F (36.8 ?C)   ?SpO2: 94%   ? ? ? ?General: Awake, no distress.  ?CV:  Good peripheral perfusion.  Tachycardia heart rate 105 ?Resp:  Normal effort.  Clear to auscultation bilaterally ?Abd:  No distention.  Mild generalized discomfort.  Soft, nonperitoneal.  No focal tenderness. ?Other:  No icterus. ? ? ?ED Results / Procedures / Treatments  ? ?Labs ?(all labs ordered are listed, but only abnormal results are displayed) ?Labs Reviewed  ?COMPREHENSIVE METABOLIC PANEL - Abnormal; Notable for the following components:  ?    Result Value  ? Glucose, Bld 136 (*)   ? Creatinine, Ser 1.10 (*)   ? GFR, Estimated 58 (*)    ? All other components within normal limits  ?CBC - Abnormal; Notable for the following components:  ? WBC 11.2 (*)   ? All other components within normal limits  ?LIPASE, BLOOD  ?URINALYSIS, ROUTINE W REFLEX MICROSCOPIC  ? ? ? ?EKG ? ? ? ? ?RADIOLOGY ? ? ? ? ?PROCEDURES: ? ?Critical Care performed: No ? ?Procedures ? ? ?MEDICATIONS ORDERED IN ED: ?Medications  ?ondansetron (ZOFRAN) injection 4 mg (4 mg Intravenous Given 12/11/21 1351)  ?ketorolac (TORADOL) 15 MG/ML injection 15 mg (15 mg Intravenous Given 12/11/21 1352)  ?pantoprazole (PROTONIX) injection 40 mg (40 mg Intravenous Given 12/11/21 1356)  ?lactated ringers bolus 1,000 mL (1,000 mLs Intravenous New Bag/Given 12/11/21 1400)  ? ? ? ?IMPRESSION / MDM / ASSESSMENT AND PLAN / ED COURSE  ?I reviewed the triage vital signs and the nursing notes. ?             ?               ? ?Differential diagnosis includes, but is not limited to, electrolyte abnormality,  dehydration, renal insufficiency, viral gastroenteritis, foodborne illness ? ? ? ?Patient presents with GI symptoms most likely acute viral syndrome versus foodborne illness.  Initial exam did not reveal focal tenderness.  Patient given IV fluids, Protonix Toradol and Zofran for pain and nausea relief, and on reassessment at 4:00 PM she is asymptomatic.  Pain is resolved.  Repeat abdominal exam is nontender, totally benign.  She is nontoxic and not requiring hospitalization due to her clinical improvement and essentially normal lab panel.  Will discharge home with prescription for Zofran. ?  ? ? ?FINAL CLINICAL IMPRESSION(S) / ED DIAGNOSES  ? ?Final diagnoses:  ?Nausea and vomiting, unspecified vomiting type  ?Dehydration  ? ? ? ?Rx / DC Orders  ? ?ED Discharge Orders   ? ?      Ordered  ?  ondansetron (ZOFRAN-ODT) 4 MG disintegrating tablet  Every 8 hours PRN       ? 12/11/21 1601  ? ?  ?  ? ?  ? ? ? ?Note:  This document was prepared using Dragon voice recognition software and may include unintentional  dictation errors. ?  ?Carrie Mew, MD ?12/11/21 1605 ? ?

## 2021-12-11 NOTE — ED Notes (Signed)
Asked the pt again if she could provide a urine but she advised she couldn't at this time.  ?

## 2021-12-14 ENCOUNTER — Telehealth: Payer: Self-pay | Admitting: *Deleted

## 2021-12-14 NOTE — Telephone Encounter (Signed)
Transition Care Management Unsuccessful Follow-up Telephone Call ? ?Date of discharge and from where:  [facility]  ? ?Attempts:  1st Attempt ? ?Reason for unsuccessful TCM follow-up call:  Left voice message ? ?  ?

## 2021-12-15 NOTE — Telephone Encounter (Signed)
Transition Care Management Unsuccessful Follow-up Telephone Call ? ?Date of discharge and from where:  [place]  ? ?Attempts:  2nd Attempt ? ?Reason for unsuccessful TCM follow-up call:  Left voice message ? ?  ?

## 2021-12-16 NOTE — Telephone Encounter (Signed)
Transition Care Management Unsuccessful Follow-up Telephone Call ? ?Date of discharge and from where:  [facility] 12-11-2021 ? ?Attempts:  3rd Attempt ? ?Reason for unsuccessful TCM follow-up call: ?Left message ? ?  ?

## 2022-02-23 ENCOUNTER — Encounter: Payer: Self-pay | Admitting: Nurse Practitioner

## 2022-02-28 NOTE — Patient Instructions (Incomplete)
Vitamin B12 Deficiency Vitamin B12 deficiency occurs when the body does not have enough of this important vitamin. The body needs this vitamin: To make red blood cells. To make DNA. This is the genetic material inside cells. To help the nerves work properly so they can carry messages from the brain to the body. Vitamin B12 deficiency can cause health problems, such as not having enough red blood cells in the blood (anemia). This can lead to nerve damage if untreated. What are the causes? This condition may be caused by: Not eating enough foods that contain vitamin B12. Not having enough stomach acid and digestive fluids to properly absorb vitamin B12 from the food that you eat. Having certain diseases that make it hard to absorb vitamin B12. These diseases include Crohn's disease, chronic pancreatitis, and cystic fibrosis. An autoimmune disorder in which the body does not make enough of a protein (intrinsic factor) within the stomach, resulting in not enough absorption of vitamin B12. Having a surgery in which part of the stomach or small intestine is removed. Taking certain medicines that make it hard for the body to absorb vitamin B12. These include: Heartburn medicines, such as antacids and proton pump inhibitors. Some medicines that are used to treat diabetes. What increases the risk? The following factors may make you more likely to develop a vitamin B12 deficiency: Being an older adult. Eating a vegetarian or vegan diet that does not include any foods that come from animals. Eating a poor diet while you are pregnant. Taking certain medicines. Having alcoholism. What are the signs or symptoms? In some cases, there are no symptoms of this condition. If the condition leads to anemia or nerve damage, various symptoms may occur, such as: Weakness. Tiredness (fatigue). Loss of appetite. Numbness or tingling in your hands and feet. Redness and burning of the tongue. Depression,  confusion, or memory problems. Trouble walking. If anemia is severe, symptoms can include: Shortness of breath. Dizziness. Rapid heart rate. How is this diagnosed? This condition may be diagnosed with a blood test to measure the level of vitamin B12 in your blood. You may also have other tests, including: A group of tests that measure certain characteristics of blood cells (complete blood count, CBC). A blood test to measure intrinsic factor. A procedure where a thin tube with a camera on the end is used to look into your stomach or intestines (endoscopy). Other tests may be needed to discover the cause of the deficiency. How is this treated? Treatment for this condition depends on the cause. This condition may be treated by: Changing your eating and drinking habits, such as: Eating more foods that contain vitamin B12. Drinking less alcohol or no alcohol. Getting vitamin B12 injections. Taking vitamin B12 supplements by mouth (orally). Your health care provider will tell you which dose is best for you. Follow these instructions at home: Eating and drinking  Include foods in your diet that come from animals and contain a lot of vitamin B12. These include: Meats and poultry. This includes beef, pork, chicken, turkey, and organ meats, such as liver. Seafood. This includes clams, rainbow trout, salmon, tuna, and haddock. Eggs. Dairy foods such as milk, yogurt, and cheese. Eat foods that have vitamin B12 added to them (are fortified), such as ready-to-eat breakfast cereals. Check the label on the package to see if a food is fortified. The items listed above may not be a complete list of foods and beverages you can eat and drink. Contact a dietitian for   more information. Alcohol use Do not drink alcohol if: Your health care provider tells you not to drink. You are pregnant, may be pregnant, or are planning to become pregnant. If you drink alcohol: Limit how much you have to: 0-1 drink a  day for women. 0-2 drinks a day for men. Know how much alcohol is in your drink. In the [place] one drink equals one 12 oz bottle of beer (355 mL), one 5 oz glass of wine (148 mL), or one 1 oz glass of hard liquor (44 mL). General instructions Get vitamin B12 injections if told to by your health care provider. Take supplements only as told by your health care provider. Follow the directions carefully. Keep all follow-up visits. This is important. Contact a health care provider if: Your symptoms come back. Your symptoms get worse or do not improve with treatment. Get help right away: You develop shortness of breath. You have a rapid heart rate. You have chest pain. You become dizzy or you faint. These symptoms may be an emergency. Get help right away. Call 911. Do not wait to see if the symptoms will go away. Do not drive yourself to the hospital. Summary Vitamin B12 deficiency occurs when the body does not have enough of this important vitamin. Common causes include not eating enough foods that contain vitamin B12, not being able to absorb vitamin B12 from the food that you eat, having a surgery in which part of the stomach or small intestine is removed, or taking certain medicines. Eat foods that have vitamin B12 in them. Treatment may include making a change in the way you eat and drink, getting vitamin B12 injections, or taking vitamin B12 supplements. This information is not intended to replace advice given to you by your health care provider. Make sure you discuss any questions you have with your health care provider. Document Revised: 05/01/2021 Document Reviewed: 05/01/2021 Elsevier Patient Education  2023 Elsevier Inc.  

## 2022-03-03 ENCOUNTER — Ambulatory Visit: Payer: BC Managed Care – PPO | Admitting: Nurse Practitioner

## 2022-03-03 ENCOUNTER — Other Ambulatory Visit: Payer: Self-pay | Admitting: Nurse Practitioner

## 2022-03-03 ENCOUNTER — Encounter: Payer: Self-pay | Admitting: Nurse Practitioner

## 2022-03-03 VITALS — BP 125/81 | HR 61 | Temp 97.9°F | Ht 66.0 in | Wt 187.0 lb

## 2022-03-03 DIAGNOSIS — N951 Menopausal and female climacteric states: Secondary | ICD-10-CM

## 2022-03-03 DIAGNOSIS — D513 Other dietary vitamin B12 deficiency anemia: Secondary | ICD-10-CM

## 2022-03-03 DIAGNOSIS — E6609 Other obesity due to excess calories: Secondary | ICD-10-CM

## 2022-03-03 DIAGNOSIS — E039 Hypothyroidism, unspecified: Secondary | ICD-10-CM | POA: Diagnosis not present

## 2022-03-03 DIAGNOSIS — C8331 Diffuse large B-cell lymphoma, lymph nodes of head, face, and neck: Secondary | ICD-10-CM

## 2022-03-03 DIAGNOSIS — Z6830 Body mass index (BMI) 30.0-30.9, adult: Secondary | ICD-10-CM

## 2022-03-03 MED ORDER — "SYRINGE 21G X 1-1/2"" 3 ML MISC": 0 refills | Status: DC

## 2022-03-03 MED ORDER — SAXENDA 18 MG/3ML ~~LOC~~ SOPN: PEN_INJECTOR | SUBCUTANEOUS | 0 refills | Status: DC

## 2022-03-03 MED ORDER — CYANOCOBALAMIN 1000 MCG/ML IJ SOLN: 1000.0000 ug | INTRAMUSCULAR | 8 refills | Status: DC

## 2022-03-03 NOTE — Assessment & Plan Note (Signed)
BMI 30.18 -- as tried for several months to lose weight without success.  Discussed GLP 1 treatment, Mancel Parsons currently no available.  No family history of thyroid cancer (MTC, MEN 2, thyroid cell tumors) or pancreatitis.  She would like to try Saxenda.  Will send this in and educated her on use + side effects.  Return in 6 weeks for follow-up.

## 2022-03-03 NOTE — Assessment & Plan Note (Signed)
History of low levels improved with monthly injections, currently symptomatic.  Will check labs today and restart monthly injections.  She will perform at home.  Supplies sent.

## 2022-03-03 NOTE — Telephone Encounter (Signed)
Notes to clinic: Duplicate request for PA from pharmacy- Rx sent today  Requested Prescriptions  Pending Prescriptions Disp Refills   SAXENDA 18 MG/3ML SOPN [Pharmacy Med Name: SAXENDA 18 MG/3 ML PEN]  0    Sig: Inject 0.6 mg into the skin daily for 7 days, THEN 1.2 mg daily for 7 days, THEN 1.8 mg daily for 7 days, THEN 2.4 mg daily for 7 days, THEN 3 mg daily.     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[place])   [place], Jolene T, NP   1 year ago Non-recurrent acute serous otitis media of right ear   [place], Jolene T, NP   1 year ago Non-recurrent acute serous otitis media of right ear   [place], Jolene T, NP   1 year ago Lab test positive for detection of COVID-19 virus   [place], [place] T, NP       Future Appointments             In 1 month Cannady, [place] T, NP [facility]               Requested Prescriptions  Pending Prescriptions Disp Refills   SAXENDA 18 MG/3ML SOPN [Pharmacy Med Name: SAXENDA 18 MG/3 ML PEN]  0    Sig: Inject 0.6 mg into the skin daily for 7 days, THEN 1.2 mg daily for 7 days, THEN 1.8 mg daily for 7 days, THEN 2.4 mg daily for 7 days, THEN 3 mg daily.     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

## 2022-03-03 NOTE — Progress Notes (Signed)
BP 125/81   Pulse 61   Temp 97.9 F (36.6 C) (Oral)   Ht '5\' 6"'$  (1.676 m)   Wt 187 lb (84.8 kg)   LMP  (LMP Unknown)   SpO2 98%   BMI 30.18 kg/m    Subjective:    Patient ID: Christina [name], female    DOB: [birth year], 61 y.o.   MRN: [id number]  HPI: Christina [name] is a 61 y.o. female  Chief Complaint  Patient presents with   B12 Injection    Patient is here to discuss B-12 injection. Patient states she is here to discuss starting the B-12 injections. Patient states she has been having extreme fatigue and hot-flashes. Patient states in the afternoons, she feels drained. Patient states she is exercising.    B12 ANEMIA Was receiving B12 injections with Dr. Ouida Sills for lower levels in past, has noticed without these she is more fatigued and drained.  These helped with her energy levels and weight loss goals.  Continues to have hot flashes, is taking Gabapentin 200 MG at this time and tolerating.  Last B12 level when getting injections was 862, prior to injections in 300 range.  Has been trying for over 6 months to lose weight, riding bike daily and walking twice a day without benefit + has performed diet changes.  Is frustrated with weight and no loss.  Did try Phentermine in past, but this did not offer much benefit.  No family history of thyroid cancer (MTC, MEN 2, thyroid cell tumors) or pancreatitis.   Anemia status: uncontrolled Severity of anemia: moderate Fatigue: yes Decreased exercise tolerance: yes  Dyspnea on exertion: no Palpitations: no Bleeding: no Pica: no   Relevant past medical, surgical, family and social history reviewed and updated as indicated. Interim medical history since our last visit reviewed. Allergies and medications reviewed and updated.  Review of Systems  Constitutional:  Positive for fatigue. Negative for activity change, appetite change, diaphoresis and fever.  Respiratory:  Negative for cough, chest tightness, shortness of breath and  wheezing.   Cardiovascular:  Negative for chest pain, palpitations and leg swelling.  Gastrointestinal: Negative.   Endocrine: Negative for cold intolerance, heat intolerance, polydipsia, polyphagia and polyuria.  Neurological: Negative.   Psychiatric/Behavioral: Negative.      Per HPI unless specifically indicated above     Objective:    BP 125/81   Pulse 61   Temp 97.9 F (36.6 C) (Oral)   Ht '5\' 6"'$  (1.676 m)   Wt 187 lb (84.8 kg)   LMP  (LMP Unknown)   SpO2 98%   BMI 30.18 kg/m   Wt Readings from Last 3 Encounters:  03/03/22 187 lb (84.8 kg)  12/11/21 183 lb (83 kg)  12/07/21 183 lb 11.2 oz (83.3 kg)    Physical Exam Vitals and nursing note reviewed.  Constitutional:      General: She is awake. She is not in acute distress.    Appearance: She is well-developed and well-groomed. She is obese. She is not ill-appearing or toxic-appearing.  HENT:     Head: Normocephalic.     Right Ear: Hearing normal.     Left Ear: Hearing normal.  Eyes:     General: Lids are normal.        Right eye: No discharge.        Left eye: No discharge.     Conjunctiva/sclera: Conjunctivae normal.     Pupils: Pupils are equal, round, and reactive to light.  Neck:  Thyroid: No thyromegaly.     Vascular: No carotid bruit.  Cardiovascular:     Rate and Rhythm: Normal rate and regular rhythm.     Heart sounds: Normal heart sounds. No murmur heard.    No gallop.  Pulmonary:     Effort: Pulmonary effort is normal. No accessory muscle usage or respiratory distress.     Breath sounds: Normal breath sounds.  Abdominal:     General: Bowel sounds are normal.     Palpations: Abdomen is soft.  Musculoskeletal:     Cervical back: Normal range of motion and neck supple.     Right lower leg: No edema.     Left lower leg: No edema.  Lymphadenopathy:     Cervical: No cervical adenopathy.  Skin:    General: Skin is warm and dry.  Neurological:     Mental Status: She is alert and oriented to  person, place, and time.  Psychiatric:        Attention and Perception: Attention normal.        Mood and Affect: Mood normal.        Behavior: Behavior normal. Behavior is cooperative.        Thought Content: Thought content normal.        Judgment: Judgment normal.    Results for orders placed or performed during the hospital encounter of 12/11/21  Lipase, blood  Result Value Ref Range   Lipase 24 11 - 51 U/L  Comprehensive metabolic panel  Result Value Ref Range   Sodium 139 135 - 145 mmol/L   Potassium 4.4 3.5 - 5.1 mmol/L   Chloride 105 98 - 111 mmol/L   CO2 25 22 - 32 mmol/L   Glucose, Bld 136 (H) 70 - 99 mg/dL   BUN 15 6 - 20 mg/dL   Creatinine, Ser 1.10 (H) 0.44 - 1.00 mg/dL   Calcium 9.6 8.9 - 10.3 mg/dL   Total Protein 8.1 6.5 - 8.1 g/dL   Albumin 4.1 3.5 - 5.0 g/dL   AST 19 15 - 41 U/L   ALT 22 0 - 44 U/L   Alkaline Phosphatase 104 38 - 126 U/L   Total Bilirubin 0.6 0.3 - 1.2 mg/dL   GFR, Estimated 58 (L) >60 mL/min   Anion gap 9 5 - 15  CBC  Result Value Ref Range   WBC 11.2 (H) 4.0 - 10.5 K/uL   RBC 4.99 3.87 - 5.11 MIL/uL   Hemoglobin 14.3 12.0 - 15.0 g/dL   HCT 45.3 36.0 - 46.0 %   MCV 90.8 80.0 - 100.0 fL   MCH 28.7 26.0 - 34.0 pg   MCHC 31.6 30.0 - 36.0 g/dL   RDW 12.7 11.5 - 15.5 %   Platelets 250 150 - 400 K/uL   nRBC 0.0 0.0 - 0.2 %      Assessment & Plan:   Problem List Items Addressed This Visit       Cardiovascular and Mediastinum   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  Recommend she increase Gabapentin to 300 MG nightly and will monitor, further adjust as needed.          Endocrine   Hypothyroid    Chronic, ongoing.  Continue current medication regimen and adjust as needed based on labs.  Thyroid labs today.        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  Continue collaboration with oncology, recent note  reviewed.      Relevant Orders   Comprehensive metabolic panel   Iron Binding Cap (TIBC)([facility])   Ferritin     Other   Obesity    BMI 30.18 -- as tried for several months to lose weight without success.  Discussed GLP 1 treatment, Mancel Parsons currently no available.  No family history of thyroid cancer (MTC, MEN 2, thyroid cell tumors) or pancreatitis.  She would like to try Saxenda.  Will send this in and educated her on use + side effects.  Return in 6 weeks for follow-up.      Relevant Medications   Liraglutide -Weight Management (SAXENDA) 18 MG/3ML SOPN   Other dietary vitamin B12 deficiency anemia    History of low levels improved with monthly injections, currently symptomatic.  Will check labs today and restart monthly injections.  She will perform at home.  Supplies sent.      Relevant Medications   cyanocobalamin (,VITAMIN B-12,) 1000 MCG/ML injection   Other Relevant Orders   CBC with Differential/Platelet   Vitamin B12   Iron Binding Cap (TIBC)([facility])   Ferritin     Follow up plan: Return in about 6 weeks (around 04/14/2022) for WEIGHT CHECK -- saxenda and B12 started.

## 2022-03-03 NOTE — Telephone Encounter (Signed)
Requested medication (s) are due for refill today - no  Requested medication (s) are on the active medication list -yes  Future visit scheduled -yes  Last refill: today  Notes to clinic: Pharmacy request: PA  Requested Prescriptions  Pending Prescriptions Disp Refills   SAXENDA 18 MG/3ML SOPN [Pharmacy Med Name: SAXENDA 18 MG/3 ML PEN]  0    Sig: Inject 0.6 mg into the skin daily for 7 days, THEN 1.2 mg daily for 7 days, THEN 1.8 mg daily for 7 days, THEN 2.4 mg daily for 7 days, THEN 3 mg daily.     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[place])   [place], Jolene T, NP   1 year ago Non-recurrent acute serous otitis media of right ear   [place], Jolene T, NP   1 year ago Non-recurrent acute serous otitis media of right ear   [place], Jolene T, NP   1 year ago Lab test positive for detection of COVID-19 virus   [place], [place] T, NP       Future Appointments             In 1 month Cannady, [place] T, NP [facility]               Requested Prescriptions  Pending Prescriptions Disp Refills   SAXENDA 18 MG/3ML SOPN [Pharmacy Med Name: SAXENDA 18 MG/3 ML PEN]  0    Sig: Inject 0.6 mg into the skin daily for 7 days, THEN 1.2 mg daily for 7 days, THEN 1.8 mg daily for 7 days,  THEN 2.4 mg daily for 7 days, THEN 3 mg daily.     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

## 2022-03-03 NOTE — Assessment & Plan Note (Signed)
Chronic, stable.  Continue collaboration with oncology, recent note reviewed. 

## 2022-03-03 NOTE — Assessment & Plan Note (Signed)
Chronic, ongoing.  Continue current medication regimen and adjust as needed based on labs.  Thyroid labs today. 

## 2022-03-03 NOTE — Assessment & Plan Note (Signed)
Chronic, ongoing.  Recommend she increase Gabapentin to 300 MG nightly and will monitor, further adjust as needed.

## 2022-03-04 ENCOUNTER — Other Ambulatory Visit: Payer: Self-pay | Admitting: Nurse Practitioner

## 2022-03-04 LAB — COMPREHENSIVE METABOLIC PANEL
ALT: 31 IU/L (ref 0–32)
AST: 20 IU/L (ref 0–40)
Albumin/Globulin Ratio: 1.8 (ref 1.2–2.2)
Albumin: 4.4 g/dL (ref 3.8–4.9)
Alkaline Phosphatase: 137 IU/L — ABNORMAL HIGH (ref 44–121)
BUN/Creatinine Ratio: 14 (ref 9–23)
BUN: 14 mg/dL (ref 6–24)
Bilirubin Total: <0.2 mg/dL (ref 0.0–1.2)
CO2: 20 mmol/L (ref 20–29)
Calcium: 9.7 mg/dL (ref 8.7–10.2)
Chloride: 105 mmol/L (ref 96–106)
Creatinine, Ser: 0.98 mg/dL (ref 0.57–1.00)
Globulin, Total: 2.4 g/dL (ref 1.5–4.5)
Glucose: 93 mg/dL (ref 70–99)
Potassium: 4.9 mmol/L (ref 3.5–5.2)
Sodium: 144 mmol/L (ref 134–144)
Total Protein: 6.8 g/dL (ref 6.0–8.5)
eGFR: 66 mL/min/{1.73_m2} (ref >59)

## 2022-03-04 LAB — CBC WITH DIFFERENTIAL/PLATELET
Basophils Absolute: 0 10*3/uL (ref 0.0–0.2)
Basos: 0 %
EOS (ABSOLUTE): 0.1 10*3/uL (ref 0.0–0.4)
Eos: 1 %
Hematocrit: 41.1 % (ref 34.0–46.6)
Hemoglobin: 13.9 g/dL (ref 11.1–15.9)
Immature Grans (Abs): 0 10*3/uL (ref 0.0–0.1)
Immature Granulocytes: 0 %
Lymphocytes Absolute: 1.9 10*3/uL (ref 0.7–3.1)
Lymphs: 28 %
MCH: 30 pg (ref 26.6–33.0)
MCHC: 33.8 g/dL (ref 31.5–35.7)
MCV: 89 fL (ref 79–97)
Monocytes Absolute: 0.5 10*3/uL (ref 0.1–0.9)
Monocytes: 7 %
Neutrophils Absolute: 4.3 10*3/uL (ref 1.4–7.0)
Neutrophils: 64 %
Platelets: 259 10*3/uL (ref 150–450)
RBC: 4.63 x10E6/uL (ref 3.77–5.28)
RDW: 13 % (ref 11.7–15.4)
WBC: 6.8 10*3/uL (ref 3.4–10.8)

## 2022-03-04 LAB — VITAMIN B12: Vitamin B-12: 515 pg/mL (ref 232–1245)

## 2022-03-04 LAB — FERRITIN: Ferritin: 58 ng/mL (ref 15–150)

## 2022-03-04 LAB — IRON AND TIBC
Iron Saturation: 24 % (ref 15–55)
Iron: 80 ug/dL (ref 27–159)
Total Iron Binding Capacity: 335 ug/dL (ref 250–450)
UIBC: 255 ug/dL (ref 131–425)

## 2022-03-04 LAB — TSH: TSH: 3.73 u[IU]/mL (ref 0.450–4.500)

## 2022-03-04 LAB — T4, FREE: Free T4: 1.13 ng/dL (ref 0.82–1.77)

## 2022-03-04 NOTE — Telephone Encounter (Signed)
Requested medication (s) are due for refill today: see request   Requested medication (s) are on the active medication list: yes  Last refill:  03/03/22-06/29/22 #52 ml 0 refills  Future visit scheduled: yes in 1 month  Notes to clinic:  Pharmacy comment: Alternative Requested:PA IS NEEDED.  Please advise     Requested Prescriptions  Pending Prescriptions Disp Refills   SAXENDA 18 MG/3ML SOPN [Pharmacy Med Name: SAXENDA 18 MG/3 ML PEN]  0    Sig: Inject 0.6 mg into the skin daily for 7 days, THEN 1.2 mg daily for 7 days, THEN 1.8 mg daily for 7 days, THEN 2.4 mg daily for 7 days, THEN 3 mg daily.     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

## 2022-03-04 NOTE — Progress Notes (Signed)
Contacted via MyChart   Good evening Christina [name], your labs have returned and are overall stable.  B12 level is on the lower end of normal, so the shots may offer you some benefit as they did in past.  Any questions? Keep being stellar!!  Thank you for allowing me to participate in your care.  I appreciate you. Kindest regards, Rodman Recupero

## 2022-03-05 ENCOUNTER — Telehealth: Payer: Self-pay | Admitting: Nurse Practitioner

## 2022-03-05 ENCOUNTER — Encounter: Payer: Self-pay | Admitting: Nurse Practitioner

## 2022-03-05 ENCOUNTER — Other Ambulatory Visit: Payer: Self-pay | Admitting: Nurse Practitioner

## 2022-03-05 MED ORDER — "SYRINGE 21G X 1-1/2"" 3 ML MISC": 0 refills | Status: DC

## 2022-03-05 NOTE — Telephone Encounter (Signed)
Requested medication (s) are due for refill today:   Prescribed 03/03/2022  Requested medication (s) are on the active medication list:   Yes  Future visit scheduled:   Yes   Last ordered: 03/03/2022 52 ml, 0 refills  Returned because a PA is needed per pharmacy   Requested Prescriptions  Pending Prescriptions Disp Refills   SAXENDA 18 MG/3ML SOPN [Pharmacy Med Name: SAXENDA 18 MG/3 ML PEN]  0    Sig: Inject 0.6 mg into the skin daily for 7 days, THEN 1.2 mg daily for 7 days, THEN 1.8 mg daily for 7 days, THEN 2.4 mg daily for 7 days, THEN 3 mg daily.     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

## 2022-03-05 NOTE — Telephone Encounter (Signed)
   Notes to clinic:  Pharm request as follows:  Pharmacy comment: Alternative Requested:NOT COVERED, NEEDS PA.     Requested Prescriptions  Pending Prescriptions Disp Refills   SAXENDA 18 MG/3ML SOPN [Pharmacy Med Name: SAXENDA 18 MG/3 ML PEN]  0    Sig: Inject 0.6 mg into the skin daily for 7 days, THEN 1.2 mg daily for 7 days, THEN 1.8 mg daily for 7 days, THEN 2.4 mg daily for 7 days, THEN 3 mg daily.     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

## 2022-03-08 ENCOUNTER — Encounter: Payer: Self-pay | Admitting: Nurse Practitioner

## 2022-03-09 ENCOUNTER — Encounter: Payer: Self-pay | Admitting: Nurse Practitioner

## 2022-03-10 MED ORDER — SAXENDA 18 MG/3ML ~~LOC~~ SOPN: PEN_INJECTOR | SUBCUTANEOUS | 0 refills | Status: DC

## 2022-03-10 NOTE — Addendum Note (Signed)
Addended by: Marnee Guarneri T on: 03/10/2022 12:01 PM   Modules accepted: Orders

## 2022-04-06 NOTE — Telephone Encounter (Signed)
Arsenio Loader with [employer] called and said if the [facility] 18 MG/3ML SOPN is still needed an appeal can be faxed to [phone].

## 2022-04-07 NOTE — Telephone Encounter (Signed)
Spoke with [employer] representative and was informed them that we never received an appeal form in regards to patient prescription of Saxenda. Representative says was provided with fax contact information. Representative said she will re-fax it over for the patient. It may take up to 5 days to receive a determination from patient's insurance.

## 2022-04-11 NOTE — Patient Instructions (Signed)

## 2022-04-14 ENCOUNTER — Ambulatory Visit: Payer: BC Managed Care – PPO | Admitting: Nurse Practitioner

## 2022-04-14 ENCOUNTER — Encounter: Payer: Self-pay | Admitting: Nurse Practitioner

## 2022-04-14 VITALS — BP 117/79 | HR 62 | Temp 98.1°F | Ht 66.0 in | Wt 190.1 lb

## 2022-04-14 DIAGNOSIS — E039 Hypothyroidism, unspecified: Secondary | ICD-10-CM

## 2022-04-14 DIAGNOSIS — D513 Other dietary vitamin B12 deficiency anemia: Secondary | ICD-10-CM

## 2022-04-14 DIAGNOSIS — Z6830 Body mass index (BMI) 30.0-30.9, adult: Secondary | ICD-10-CM | POA: Diagnosis not present

## 2022-04-14 DIAGNOSIS — E6609 Other obesity due to excess calories: Secondary | ICD-10-CM

## 2022-04-14 MED ORDER — TOPIRAMATE 50 MG PO TABS: 50.0000 mg | ORAL_TABLET | Freq: Every day | ORAL | 12 refills | Status: DC

## 2022-04-14 MED ORDER — CYANOCOBALAMIN 1000 MCG/ML IJ SOLN: 1000.0000 ug | INTRAMUSCULAR | 3 refills | Status: DC

## 2022-04-14 NOTE — Assessment & Plan Note (Signed)
Chronic, ongoing.  Continue current medication regimen and adjust as needed based on labs.  Thyroid labs up to date and stable.

## 2022-04-14 NOTE — Assessment & Plan Note (Signed)
BMI 30.68 -- has tried for several months to lose weight without success.  Discussed GLP 1 treatment, however insurance is not covering.  No family history of thyroid cancer (MTC, MEN 2, thyroid cell tumors) or pancreatitis.  At this time will send in Topamax to start daily for weight loss, educated her on this, and place new referral to Dr. Alric Quan as may benefit from Phentermine with the Topamax, we do not prescribe in this office.  Would ultimately benefit from GLP1, however unable to get coverage.  Return in 6 weeks.

## 2022-04-14 NOTE — Assessment & Plan Note (Signed)
History of low levels improved with monthly injections, currently symptomatic with fatigue.  Will trial increase in injections to every 2 weeks.  She will perform at home.  Supplies sent.

## 2022-04-14 NOTE — Progress Notes (Signed)
BP 117/79   Pulse 62   Temp 98.1 F (36.7 C) (Oral)   Ht _0  (1.676 m)   Wt 190 lb 1.6 oz (86.2 kg)   LMP  (LMP Unknown)   SpO2 95%   BMI 30.68 kg/m    Subjective:    Patient ID: Christina [name], female    DOB: [birth year], 61 y.o.   MRN: [id number]  HPI: Christina [name] is a 61 y.o. female  Chief Complaint  Patient presents with   Weight Check    Patient is here for six week Weight Check follow up. Patient says she is doing OK. Patient says she is still tired and having some light-headed spells. Patient says she is still noticing a weight gain with her Thyroid.    HYPOTHYROIDISM Continues on Levothyroxine with recent thyroid labs stable.  Did see weight management in past.  Has been trying for over 6 months to lose weight, riding bike daily and walking twice a day without benefit + has performed diet changes.  Is frustrated with weight and no loss.  Did try Phentermine in past, but this did not offer much benefit.  No family history of thyroid cancer (MTC, MEN 2, thyroid cell tumors) or pancreatitis.  We tried to get coverage for [facility] and Saxenda, but insurance would not cover.  She would like to lose weight and get to around 135 to 140 range.  Is feeling fatigue -- taking B12 monthly, would like to try more frequent dosing. Thyroid control status:stable Satisfied with current treatment? yes Medication side effects: no Medication compliance: good compliance Etiology of hypothyroidism:  Recent dose adjustment:no Fatigue: yes Cold intolerance: no Heat intolerance: no Weight gain: yes Weight loss: no Constipation: no Diarrhea/loose stools: no Palpitations: no Lower extremity edema: no Anxiety/depressed mood: no   Relevant past medical, surgical, family and social history reviewed and updated as indicated. Interim medical history since our last visit reviewed. Allergies and medications reviewed and updated.  Review of Systems  Constitutional:  Positive for fatigue.  Negative for activity change, appetite change, diaphoresis and fever.  Respiratory:  Negative for cough, chest tightness, shortness of breath and wheezing.   Cardiovascular:  Negative for chest pain, palpitations and leg swelling.  Gastrointestinal: Negative.   Endocrine: Negative for cold intolerance, heat intolerance, polydipsia, polyphagia and polyuria.  Neurological: Negative.   Psychiatric/Behavioral: Negative.      Per HPI unless specifically indicated above     Objective:    BP 117/79   Pulse 62   Temp 98.1 F (36.7 C) (Oral)   Ht _1  (1.676 m)   Wt 190 lb 1.6 oz (86.2 kg)   LMP  (LMP Unknown)   SpO2 95%   BMI 30.68 kg/m   Wt Readings from Last 3 Encounters:  04/14/22 190 lb 1.6 oz (86.2 kg)  03/03/22 187 lb (84.8 kg)  12/11/21 183 lb (83 kg)    Physical Exam Vitals and nursing note reviewed.  Constitutional:      General: She is awake. She is not in acute distress.    Appearance: She is well-developed and well-groomed. She is obese. She is not ill-appearing or toxic-appearing.  HENT:     Head: Normocephalic.     Right Ear: Hearing normal.     Left Ear: Hearing normal.  Eyes:     General: Lids are normal.        Right eye: No discharge.        Left eye: No discharge.  Conjunctiva/sclera: Conjunctivae normal.     Pupils: Pupils are equal, round, and reactive to light.  Neck:     Thyroid: No thyromegaly.     Vascular: No carotid bruit.  Cardiovascular:     Rate and Rhythm: Normal rate and regular rhythm.     Heart sounds: Normal heart sounds. No murmur heard.    No gallop.  Pulmonary:     Effort: Pulmonary effort is normal. No accessory muscle usage or respiratory distress.     Breath sounds: Normal breath sounds.  Abdominal:     General: Bowel sounds are normal.     Palpations: Abdomen is soft.  Musculoskeletal:     Cervical back: Normal range of motion and neck supple.     Right lower leg: No edema.     Left lower leg: No edema.   Lymphadenopathy:     Cervical: No cervical adenopathy.  Skin:    General: Skin is warm and dry.  Neurological:     Mental Status: She is alert and oriented to person, place, and time.  Psychiatric:        Attention and Perception: Attention normal.        Mood and Affect: Mood normal.        Behavior: Behavior normal. Behavior is cooperative.        Thought Content: Thought content normal.        Judgment: Judgment normal.     Results for orders placed or performed in visit on 03/03/22  CBC with Differential/Platelet  Result Value Ref Range   WBC 6.8 3.4 - 10.8 x10E3/uL   RBC 4.63 3.77 - 5.28 x10E6/uL   Hemoglobin 13.9 11.1 - 15.9 g/dL   Hematocrit 41.1 34.0 - 46.6 %   MCV 89 79 - 97 fL   MCH 30.0 26.6 - 33.0 pg   MCHC 33.8 31.5 - 35.7 g/dL   RDW 13.0 11.7 - 15.4 %   Platelets 259 150 - 450 x10E3/uL   Neutrophils 64 Not Estab. %   Lymphs 28 Not Estab. %   Monocytes 7 Not Estab. %   Eos 1 Not Estab. %   Basos 0 Not Estab. %   Neutrophils Absolute 4.3 1.4 - 7.0 x10E3/uL   Lymphocytes Absolute 1.9 0.7 - 3.1 x10E3/uL   Monocytes Absolute 0.5 0.1 - 0.9 x10E3/uL   EOS (ABSOLUTE) 0.1 0.0 - 0.4 x10E3/uL   Basophils Absolute 0.0 0.0 - 0.2 x10E3/uL   Immature Granulocytes 0 Not Estab. %   Immature Grans (Abs) 0.0 0.0 - 0.1 x10E3/uL  Comprehensive metabolic panel  Result Value Ref Range   Glucose 93 70 - 99 mg/dL   BUN 14 6 - 24 mg/dL   Creatinine, Ser 0.98 0.57 - 1.00 mg/dL   eGFR 66 >59 mL/min/1.73   BUN/Creatinine Ratio 14 9 - 23   Sodium 144 134 - 144 mmol/L   Potassium 4.9 3.5 - 5.2 mmol/L   Chloride 105 96 - 106 mmol/L   CO2 20 20 - 29 mmol/L   Calcium 9.7 8.7 - 10.2 mg/dL   Total Protein 6.8 6.0 - 8.5 g/dL   Albumin 4.4 3.8 - 4.9 g/dL   Globulin, Total 2.4 1.5 - 4.5 g/dL   Albumin/Globulin Ratio 1.8 1.2 - 2.2   Bilirubin Total <0.2 0.0 - 1.2 mg/dL   Alkaline Phosphatase 137 (H) 44 - 121 IU/L   AST 20 0 - 40 IU/L   ALT 31 0 - 32 IU/L  TSH  Result Value Ref  Range   TSH 3.730 0.450 - 4.500 uIU/mL  T4, free  Result Value Ref Range   Free T4 1.13 0.82 - 1.77 ng/dL  Vitamin B12  Result Value Ref Range   Vitamin B-12 515 232 - 1,245 pg/mL  Iron Binding Cap (TIBC)([facility])  Result Value Ref Range   Total Iron Binding Capacity 335 250 - 450 ug/dL   UIBC 255 131 - 425 ug/dL   Iron 80 27 - 159 ug/dL   Iron Saturation 24 15 - 55 %  Ferritin  Result Value Ref Range   Ferritin 58 15 - 150 ng/mL      Assessment & Plan:   Problem List Items Addressed This Visit       Endocrine   Hypothyroid    Chronic, ongoing.  Continue current medication regimen and adjust as needed based on labs.  Thyroid labs up to date and stable.        Other   Obesity - Primary    BMI 30.68 -- has tried for several months to lose weight without success.  Discussed GLP 1 treatment, however insurance is not covering.  No family history of thyroid cancer (MTC, MEN 2, thyroid cell tumors) or pancreatitis.  At this time will send in Topamax to start daily for weight loss, educated her on this, and place new referral to Dr. Alric Quan as may benefit from Phentermine with the Topamax, we do not prescribe in this office.  Would ultimately benefit from GLP1, however unable to get coverage.  Return in 6 weeks.      Relevant Orders   Amb Ref to Medical Weight Management   Other dietary vitamin B12 deficiency anemia    History of low levels improved with monthly injections, currently symptomatic with fatigue.  Will trial increase in injections to every 2 weeks.  She will perform at home.  Supplies sent.      Relevant Medications   cyanocobalamin (,VITAMIN B-12,) 1000 MCG/ML injection     Follow up plan: Return in about 6 weeks (around 05/26/2022) for WEIGHT CHECK.

## 2022-05-05 ENCOUNTER — Other Ambulatory Visit: Payer: Self-pay | Admitting: Nurse Practitioner

## 2022-05-06 NOTE — Telephone Encounter (Signed)
Requested medication (s) are due for refill today:yes  Requested medication (s) are on the active medication list: no    Last refill: 03/05/22  50  0  Future visit scheduled yes 05/28/22  Notes to clinic:On current med profile  3cc 21G x 1-1/2. Please review. Thank you.  Requested Prescriptions  Pending Prescriptions Disp Refills   B-D 3CC LUER-LOK SYR 23GX1-1/2 23G X 1-1/2" 3 ML MISC [Pharmacy Med Name: BD 3 ML SYRINGE 23GX1-1/2"] 50 each     Sig: TO USE FOR MONTHLY B 12 INJECTIONS.     There is no refill protocol information for this order

## 2022-05-28 ENCOUNTER — Ambulatory Visit: Payer: BC Managed Care – PPO | Admitting: Nurse Practitioner

## 2022-05-30 NOTE — Patient Instructions (Signed)

## 2022-06-01 ENCOUNTER — Other Ambulatory Visit: Payer: Self-pay | Admitting: Nurse Practitioner

## 2022-06-01 ENCOUNTER — Encounter: Payer: Self-pay | Admitting: Nurse Practitioner

## 2022-06-01 ENCOUNTER — Ambulatory Visit: Payer: BC Managed Care – PPO | Admitting: Nurse Practitioner

## 2022-06-01 DIAGNOSIS — E6609 Other obesity due to excess calories: Secondary | ICD-10-CM | POA: Diagnosis not present

## 2022-06-01 DIAGNOSIS — Z6830 Body mass index (BMI) 30.0-30.9, adult: Secondary | ICD-10-CM | POA: Diagnosis not present

## 2022-06-01 MED ORDER — NALTREXONE-BUPROPION HCL ER 8-90 MG PO TB12: ORAL_TABLET | ORAL | 3 refills | Status: DC

## 2022-06-01 NOTE — Progress Notes (Signed)
BP 111/76   Pulse 66   Temp 97.9 F (36.6 C) (Oral)   Ht 5' 6" (1.676 m)   Wt 183 lb 12.8 oz (83.4 kg)   LMP  (LMP Unknown)   SpO2 98%   BMI 29.67 kg/m    Subjective:    Patient ID: Christina [name], female    DOB: [birth year], 61 y.o.   MRN: [id number]  HPI: Christina [name] is a 61 y.o. female  Chief Complaint  Patient presents with   Weight Check    Patient is here for six week Weight Check. Patient denies having any concerns at today's visit.    Medication Problem    Patient says she would like to discuss Topamax prescription as she says the medication makes everything taste like metal. Patient would like to discuss different treatment options.    WEIGHT LOSS: Started on Topamax at visit 04/14/22, has also been doing [place] for 4 weeks.  Does not like metal taste of Topamax and would like to try something different.  Has an appointment with Dr. Ouida Sills on October 2nd to further discuss weight loss management.  Has been trying for over 6 months to lose weight, riding bike daily and walking twice a day without benefit + has performed diet changes.  Is frustrated with weight and no loss.  Did try Phentermine in past, but this did not offer much benefit.  No family history of thyroid cancer (MTC, MEN 2, thyroid cell tumors) or pancreatitis.  We tried to get coverage for [facility] and Saxenda, but insurance would not cover.  She would like to lose weight and get to around 135 to 140 range.  Is feeling fatigue -- has increased B12 injections to every 2 weeks which has helped some with energy.    Relevant past medical, surgical, family and social history reviewed and updated as indicated. Interim medical history since our last visit reviewed. Allergies and medications reviewed and updated.  Review of Systems  Constitutional:  Negative for activity change, appetite change, diaphoresis and fever.  Respiratory:  Negative for cough, chest tightness, shortness of breath and wheezing.    Cardiovascular:  Negative for chest pain, palpitations and leg swelling.  Gastrointestinal: Negative.   Endocrine: Negative for cold intolerance, heat intolerance, polydipsia, polyphagia and polyuria.  Neurological: Negative.   Psychiatric/Behavioral: Negative.      Per HPI unless specifically indicated above     Objective:    BP 111/76   Pulse 66   Temp 97.9 F (36.6 C) (Oral)   Ht 5' 6" (1.676 m)   Wt 183 lb 12.8 oz (83.4 kg)   LMP  (LMP Unknown)   SpO2 98%   BMI 29.67 kg/m   Wt Readings from Last 3 Encounters:  06/01/22 183 lb 12.8 oz (83.4 kg)  04/14/22 190 lb 1.6 oz (86.2 kg)  03/03/22 187 lb (84.8 kg)    Physical Exam Vitals and nursing note reviewed.  Constitutional:      General: She is awake. She is not in acute distress.    Appearance: She is well-developed and well-groomed. She is obese. She is not ill-appearing or toxic-appearing.  HENT:     Head: Normocephalic.     Right Ear: Hearing normal.     Left Ear: Hearing normal.  Eyes:     General: Lids are normal.        Right eye: No discharge.        Left eye: No discharge.  Conjunctiva/sclera: Conjunctivae normal.     Pupils: Pupils are equal, round, and reactive to light.  Neck:     Thyroid: No thyromegaly.     Vascular: No carotid bruit.  Cardiovascular:     Rate and Rhythm: Normal rate and regular rhythm.     Heart sounds: Normal heart sounds. No murmur heard.    No gallop.  Pulmonary:     Effort: Pulmonary effort is normal. No accessory muscle usage or respiratory distress.     Breath sounds: Normal breath sounds.  Abdominal:     General: Bowel sounds are normal.     Palpations: Abdomen is soft.  Musculoskeletal:     Cervical back: Normal range of motion and neck supple.     Right lower leg: No edema.     Left lower leg: No edema.  Lymphadenopathy:     Cervical: No cervical adenopathy.  Skin:    General: Skin is warm and dry.  Neurological:     Mental Status: She is alert and oriented  to person, place, and time.  Psychiatric:        Attention and Perception: Attention normal.        Mood and Affect: Mood normal.        Behavior: Behavior normal. Behavior is cooperative.        Thought Content: Thought content normal.        Judgment: Judgment normal.     Results for orders placed or performed in visit on 03/03/22  CBC with Differential/Platelet  Result Value Ref Range   WBC 6.8 3.4 - 10.8 x10E3/uL   RBC 4.63 3.77 - 5.28 x10E6/uL   Hemoglobin 13.9 11.1 - 15.9 g/dL   Hematocrit 41.1 34.0 - 46.6 %   MCV 89 79 - 97 fL   MCH 30.0 26.6 - 33.0 pg   MCHC 33.8 31.5 - 35.7 g/dL   RDW 13.0 11.7 - 15.4 %   Platelets 259 150 - 450 x10E3/uL   Neutrophils 64 Not Estab. %   Lymphs 28 Not Estab. %   Monocytes 7 Not Estab. %   Eos 1 Not Estab. %   Basos 0 Not Estab. %   Neutrophils Absolute 4.3 1.4 - 7.0 x10E3/uL   Lymphocytes Absolute 1.9 0.7 - 3.1 x10E3/uL   Monocytes Absolute 0.5 0.1 - 0.9 x10E3/uL   EOS (ABSOLUTE) 0.1 0.0 - 0.4 x10E3/uL   Basophils Absolute 0.0 0.0 - 0.2 x10E3/uL   Immature Granulocytes 0 Not Estab. %   Immature Grans (Abs) 0.0 0.0 - 0.1 x10E3/uL  Comprehensive metabolic panel  Result Value Ref Range   Glucose 93 70 - 99 mg/dL   BUN 14 6 - 24 mg/dL   Creatinine, Ser 0.98 0.57 - 1.00 mg/dL   eGFR 66 >59 mL/min/1.73   BUN/Creatinine Ratio 14 9 - 23   Sodium 144 134 - 144 mmol/L   Potassium 4.9 3.5 - 5.2 mmol/L   Chloride 105 96 - 106 mmol/L   CO2 20 20 - 29 mmol/L   Calcium 9.7 8.7 - 10.2 mg/dL   Total Protein 6.8 6.0 - 8.5 g/dL   Albumin 4.4 3.8 - 4.9 g/dL   Globulin, Total 2.4 1.5 - 4.5 g/dL   Albumin/Globulin Ratio 1.8 1.2 - 2.2   Bilirubin Total <0.2 0.0 - 1.2 mg/dL   Alkaline Phosphatase 137 (H) 44 - 121 IU/L   AST 20 0 - 40 IU/L   ALT 31 0 - 32 IU/L  TSH  Result Value Ref  Range   TSH 3.730 0.450 - 4.500 uIU/mL  T4, free  Result Value Ref Range   Free T4 1.13 0.82 - 1.77 ng/dL  Vitamin B12  Result Value Ref Range   Vitamin B-12  515 232 - 1,245 pg/mL  Iron Binding Cap (TIBC)([facility])  Result Value Ref Range   Total Iron Binding Capacity 335 250 - 450 ug/dL   UIBC 255 131 - 425 ug/dL   Iron 80 27 - 159 ug/dL   Iron Saturation 24 15 - 55 %  Ferritin  Result Value Ref Range   Ferritin 58 15 - 150 ng/mL      Assessment & Plan:   Problem List Items Addressed This Visit       Other   Obesity    BMI 29.67 today -- has lost 7 pounds with Topamax and Optavia.  GLP 1 treatment, insurance is not covering.  No family history of thyroid cancer (MTC, MEN 2, thyroid cell tumors) or pancreatitis. At this time stop Topamax due to side effect, metal taste, and we will try to get coverage for Contrave -- educated her on this.  She is scheduled to return to see Dr. Ouida Sills upcoming, as tolerated and did well with Phentermine prescribed by him in past, may ultimately restart this.  Would benefit from GLP1, however unable to get coverage.  Return in 6 weeks.      Relevant Medications   Naltrexone-buPROPion HCl ER 8-90 MG TB12     Follow up plan: Return in about 6 weeks (around 07/13/2022) for WEIGHT CHECK.

## 2022-06-01 NOTE — Assessment & Plan Note (Signed)
BMI 29.67 today -- has lost 7 pounds with Topamax and Optavia.  GLP 1 treatment, insurance is not covering.  No family history of thyroid cancer (MTC, MEN 2, thyroid cell tumors) or pancreatitis. At this time stop Topamax due to side effect, metal taste, and we will try to get coverage for Contrave -- educated her on this.  She is scheduled to return to see Dr. Ouida Sills upcoming, as tolerated and did well with Phentermine prescribed by him in past, may ultimately restart this.  Would benefit from GLP1, however unable to get coverage.  Return in 6 weeks.

## 2022-06-02 NOTE — Telephone Encounter (Signed)
Did we attempt PA for this? Please check:)

## 2022-06-02 NOTE — Telephone Encounter (Signed)
Requested medication (s) are due for refill today: No  Requested medication (s) are on the active medication list: Yes  Last refill:  06/01/22  Future visit scheduled: Yes  Notes to clinic:  Pharmacy requests prior authorization.    Requested Prescriptions  Pending Prescriptions Disp Refills   CONTRAVE 8-90 MG TB12 [Pharmacy Med Name: CONTRAVE ER 8-90 MG TABLET] 120 tablet 3    Sig: START 1 TABLET EVERY MORNING FOR 7 DAYS, THEN 1 TABLET TWICE DAILY FOR 7 DAYS, THEN 2 TABLETS EVERY MORNING AND ONE IN THE EVENING     Off-Protocol Failed - 06/01/2022 12:10 PM      Failed - Medication not assigned to a protocol, review manually.      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

## 2022-06-02 NOTE — Telephone Encounter (Signed)
Prior authorization was initiated via CoverMyMeds for prescription Contrave 8-90 MG tablets. Awaiting determination from patient's insurance.    KEY: [id number]

## 2022-06-03 NOTE — Telephone Encounter (Signed)
Spoke with patient and made her aware of denial via CoverMyMeds for Contrave 8-90 tablets. Patient was aware of Jolene's recommendations. Patient verbalized understanding and has no further questions.

## 2022-07-11 NOTE — Patient Instructions (Signed)
Calorie Counting for Weight Loss Calories are units of energy. Your body needs a certain number of calories from food to keep going throughout the day. When you eat or drink more calories than your body needs, your body stores the extra calories mostly as fat. When you eat or drink fewer calories than your body needs, your body burns fat to get the energy it needs. Calorie counting means keeping track of how many calories you eat and drink each day. Calorie counting can be helpful if you need to lose weight. If you eat fewer calories than your body needs, you should lose weight. Ask your health care provider what a healthy weight is for you. For calorie counting to work, you will need to eat the right number of calories each day to lose a healthy amount of weight per week. A dietitian can help you figure out how many calories you need in a day and will suggest ways to reach your calorie goal. A healthy amount of weight to lose each week is usually 1-2 lb (0.5-0.9 kg). This usually means that your daily calorie intake should be reduced by 500-750 calories. Eating 1,200-1,500 calories a day can help most women lose weight. Eating 1,500-1,800 calories a day can help most men lose weight. What do I need to know about calorie counting? Work with your health care provider or dietitian to determine how many calories you should get each day. To meet your daily calorie goal, you will need to: Find out how many calories are in each food that you would like to eat. Try to do this before you eat. Decide how much of the food you plan to eat. Keep a food log. Do this by writing down what you ate and how many calories it had. To successfully lose weight, it is important to balance calorie counting with a healthy lifestyle that includes regular activity. Where do I find calorie information?  The number of calories in a food can be found on a Nutrition Facts label. If a food does not have a Nutrition Facts label, try  to look up the calories online or ask your dietitian for help. Remember that calories are listed per serving. If you choose to have more than one serving of a food, you will have to multiply the calories per serving by the number of servings you plan to eat. For example, the label on a package of bread might say that a serving size is 1 slice and that there are 90 calories in a serving. If you eat 1 slice, you will have eaten 90 calories. If you eat 2 slices, you will have eaten 180 calories. How do I keep a food log? After each time that you eat, record the following in your food log as soon as possible: What you ate. Be sure to include toppings, sauces, and other extras on the food. How much you ate. This can be measured in cups, ounces, or number of items. How many calories were in each food and drink. The total number of calories in the food you ate. Keep your food log near you, such as in a pocket-sized notebook or on an app or website on your mobile phone. Some programs will calculate calories for you and show you how many calories you have left to meet your daily goal. What are some portion-control tips? Know how many calories are in a serving. This will help you know how many servings you can have of a certain   food. Use a measuring cup to measure serving sizes. You could also try weighing out portions on a kitchen scale. With time, you will be able to estimate serving sizes for some foods. Take time to put servings of different foods on your favorite plates or in your favorite bowls and cups so you know what a serving looks like. Try not to eat straight from a food's packaging, such as from a bag or box. Eating straight from the package makes it hard to see how much you are eating and can lead to overeating. Put the amount you would like to eat in a cup or on a plate to make sure you are eating the right portion. Use smaller plates, glasses, and bowls for smaller portions and to prevent  overeating. Try not to multitask. For example, avoid watching TV or using your computer while eating. If it is time to eat, sit down at a table and enjoy your food. This will help you recognize when you are full. It will also help you be more mindful of what and how much you are eating. What are tips for following this plan? Reading food labels Check the calorie count compared with the serving size. The serving size may be smaller than what you are used to eating. Check the source of the calories. Try to choose foods that are high in protein, fiber, and vitamins, and low in saturated fat, trans fat, and sodium. Shopping Read nutrition labels while you shop. This will help you make healthy decisions about which foods to buy. Pay attention to nutrition labels for low-fat or fat-free foods. These foods sometimes have the same number of calories or more calories than the full-fat versions. They also often have added sugar, starch, or salt to make up for flavor that was removed with the fat. Make a grocery list of lower-calorie foods and stick to it. Cooking Try to cook your favorite foods in a healthier way. For example, try baking instead of frying. Use low-fat dairy products. Meal planning Use more fruits and vegetables. One-half of your plate should be fruits and vegetables. Include lean proteins, such as chicken, turkey, and fish. Lifestyle Each week, aim to do one of the following: 150 minutes of moderate exercise, such as walking. 75 minutes of vigorous exercise, such as running. General information Know how many calories are in the foods you eat most often. This will help you calculate calorie counts faster. Find a way of tracking calories that works for you. Get creative. Try different apps or programs if writing down calories does not work for you. What foods should I eat?  Eat nutritious foods. It is better to have a nutritious, high-calorie food, such as an avocado, than a food with  few nutrients, such as a bag of potato chips. Use your calories on foods and drinks that will fill you up and will not leave you hungry soon after eating. Examples of foods that fill you up are nuts and nut butters, vegetables, lean proteins, and high-fiber foods such as whole grains. High-fiber foods are foods with more than 5 g of fiber per serving. Pay attention to calories in drinks. Low-calorie drinks include water and unsweetened drinks. The items listed above may not be a complete list of foods and beverages you can eat. Contact a dietitian for more information. What foods should I limit? Limit foods or drinks that are not good sources of vitamins, minerals, or protein or that are high in unhealthy fats. These   include: Candy. Other sweets. Sodas, specialty coffee drinks, alcohol, and juice. The items listed above may not be a complete list of foods and beverages you should avoid. Contact a dietitian for more information. How do I count calories when eating out? Pay attention to portions. Often, portions are much larger when eating out. Try these tips to keep portions smaller: Consider sharing a meal instead of getting your own. If you get your own meal, eat only half of it. Before you start eating, ask for a container and put half of your meal into it. When available, consider ordering smaller portions from the menu instead of full portions. Pay attention to your food and drink choices. Knowing the way food is cooked and what is included with the meal can help you eat fewer calories. If calories are listed on the menu, choose the lower-calorie options. Choose dishes that include vegetables, fruits, whole grains, low-fat dairy products, and lean proteins. Choose items that are boiled, broiled, grilled, or steamed. Avoid items that are buttered, battered, fried, or served with cream sauce. Items labeled as crispy are usually fried, unless stated otherwise. Choose water, low-fat milk,  unsweetened iced tea, or other drinks without added sugar. If you want an alcoholic beverage, choose a lower-calorie option, such as a glass of wine or light beer. Ask for dressings, sauces, and syrups on the side. These are usually high in calories, so you should limit the amount you eat. If you want a salad, choose a garden salad and ask for grilled meats. Avoid extra toppings such as bacon, cheese, or fried items. Ask for the dressing on the side, or ask for olive oil and vinegar or lemon to use as dressing. Estimate how many servings of a food you are given. Knowing serving sizes will help you be aware of how much food you are eating at restaurants. Where to find more information Centers for Disease Control and Prevention: www.cdc.gov U.[place] Department of Agriculture: myplate.gov Summary Calorie counting means keeping track of how many calories you eat and drink each day. If you eat fewer calories than your body needs, you should lose weight. A healthy amount of weight to lose per week is usually 1-2 lb (0.5-0.9 kg). This usually means reducing your daily calorie intake by 500-750 calories. The number of calories in a food can be found on a Nutrition Facts label. If a food does not have a Nutrition Facts label, try to look up the calories online or ask your dietitian for help. Use smaller plates, glasses, and bowls for smaller portions and to prevent overeating. Use your calories on foods and drinks that will fill you up and not leave you hungry shortly after a meal. This information is not intended to replace advice given to you by your health care provider. Make sure you discuss any questions you have with your health care provider. Document Revised: 10/18/2019 Document Reviewed: 10/18/2019 Elsevier Patient Education  2023 Elsevier Inc.  

## 2022-07-13 ENCOUNTER — Encounter: Payer: Self-pay | Admitting: Nurse Practitioner

## 2022-07-13 ENCOUNTER — Ambulatory Visit: Payer: BC Managed Care – PPO | Admitting: Nurse Practitioner

## 2022-07-13 DIAGNOSIS — E6609 Other obesity due to excess calories: Secondary | ICD-10-CM | POA: Diagnosis not present

## 2022-07-13 DIAGNOSIS — Z6830 Body mass index (BMI) 30.0-30.9, adult: Secondary | ICD-10-CM | POA: Diagnosis not present

## 2022-07-13 NOTE — Progress Notes (Signed)
BP 121/74   Pulse (!) 59   Temp 98 F (36.7 C) (Oral)   Wt 184 lb 8 oz (83.7 kg)   LMP  (LMP Unknown)   SpO2 97%   BMI 29.78 kg/m    Subjective:    Patient ID: Christina [name], female    DOB: October 14, 1960, 61 y.o.   MRN: [id number]  HPI: Christina [name] is a 61 y.o. female  Chief Complaint  Patient presents with   Weight Check    6 week f/up   WEIGHT LOSS: Follow-up for weight check, last visit started Contrave (was not covered by insurance) and B12 injections (is performing these). Doing Optavia diet.  Did not like metal taste of Topamax and stopped this.  Had an appointment with Dr. Ouida Sills on October 2nd to further discuss weight loss management -- no changes made.  Has been trying for over 6 months to lose weight, riding bike daily and walking twice a day without benefit + has performed diet changes.  Is frustrated with weight and no loss.  Did try Phentermine in past, but this did not offer much benefit.  No family history of thyroid cancer (MTC, MEN 2, thyroid cell tumors) or pancreatitis.  We tried to get coverage for [facility] and Saxenda, but insurance would not cover.  She would like to lose weight and get to around 135 to 140 range.    Relevant past medical, surgical, family and social history reviewed and updated as indicated. Interim medical history since our last visit reviewed. Allergies and medications reviewed and updated.  Review of Systems  Constitutional:  Negative for activity change, appetite change, diaphoresis and fever.  Respiratory:  Negative for cough, chest tightness, shortness of breath and wheezing.   Cardiovascular:  Negative for chest pain, palpitations and leg swelling.  Gastrointestinal: Negative.   Endocrine: Negative for cold intolerance, heat intolerance, polydipsia, polyphagia and polyuria.  Neurological: Negative.   Psychiatric/Behavioral: Negative.     Per HPI unless specifically indicated above    Objective:    BP 121/74   Pulse (!)  59   Temp 98 F (36.7 C) (Oral)   Wt 184 lb 8 oz (83.7 kg)   LMP  (LMP Unknown)   SpO2 97%   BMI 29.78 kg/m   Wt Readings from Last 3 Encounters:  07/13/22 184 lb 8 oz (83.7 kg)  06/01/22 183 lb 12.8 oz (83.4 kg)  04/14/22 190 lb 1.6 oz (86.2 kg)    Physical Exam Vitals and nursing note reviewed.  Constitutional:      General: She is awake. She is not in acute distress.    Appearance: She is well-developed and well-groomed. She is obese. She is not ill-appearing or toxic-appearing.  HENT:     Head: Normocephalic.     Right Ear: Hearing normal.     Left Ear: Hearing normal.  Eyes:     General: Lids are normal.        Right eye: No discharge.        Left eye: No discharge.     Conjunctiva/sclera: Conjunctivae normal.     Pupils: Pupils are equal, round, and reactive to light.  Neck:     Thyroid: No thyromegaly.     Vascular: No carotid bruit.  Cardiovascular:     Rate and Rhythm: Normal rate and regular rhythm.     Heart sounds: Normal heart sounds. No murmur heard.    No gallop.  Pulmonary:     Effort: Pulmonary  effort is normal. No accessory muscle usage or respiratory distress.     Breath sounds: Normal breath sounds.  Abdominal:     General: Bowel sounds are normal.     Palpations: Abdomen is soft.  Musculoskeletal:     Cervical back: Normal range of motion and neck supple.     Right lower leg: No edema.     Left lower leg: No edema.  Lymphadenopathy:     Cervical: No cervical adenopathy.  Skin:    General: Skin is warm and dry.  Neurological:     Mental Status: She is alert and oriented to person, place, and time.  Psychiatric:        Attention and Perception: Attention normal.        Mood and Affect: Mood normal.        Behavior: Behavior normal. Behavior is cooperative.        Thought Content: Thought content normal.        Judgment: Judgment normal.    Results for orders placed or performed in visit on 03/03/22  CBC with Differential/Platelet  Result  Value Ref Range   WBC 6.8 3.4 - 10.8 x10E3/uL   RBC 4.63 3.77 - 5.28 x10E6/uL   Hemoglobin 13.9 11.1 - 15.9 g/dL   Hematocrit 41.1 34.0 - 46.6 %   MCV 89 79 - 97 fL   MCH 30.0 26.6 - 33.0 pg   MCHC 33.8 31.5 - 35.7 g/dL   RDW 13.0 11.7 - 15.4 %   Platelets 259 150 - 450 x10E3/uL   Neutrophils 64 Not Estab. %   Lymphs 28 Not Estab. %   Monocytes 7 Not Estab. %   Eos 1 Not Estab. %   Basos 0 Not Estab. %   Neutrophils Absolute 4.3 1.4 - 7.0 x10E3/uL   Lymphocytes Absolute 1.9 0.7 - 3.1 x10E3/uL   Monocytes Absolute 0.5 0.1 - 0.9 x10E3/uL   EOS (ABSOLUTE) 0.1 0.0 - 0.4 x10E3/uL   Basophils Absolute 0.0 0.0 - 0.2 x10E3/uL   Immature Granulocytes 0 Not Estab. %   Immature Grans (Abs) 0.0 0.0 - 0.1 x10E3/uL  Comprehensive metabolic panel  Result Value Ref Range   Glucose 93 70 - 99 mg/dL   BUN 14 6 - 24 mg/dL   Creatinine, Ser 0.98 0.57 - 1.00 mg/dL   eGFR 66 >59 mL/min/1.73   BUN/Creatinine Ratio 14 9 - 23   Sodium 144 134 - 144 mmol/L   Potassium 4.9 3.5 - 5.2 mmol/L   Chloride 105 96 - 106 mmol/L   CO2 20 20 - 29 mmol/L   Calcium 9.7 8.7 - 10.2 mg/dL   Total Protein 6.8 6.0 - 8.5 g/dL   Albumin 4.4 3.8 - 4.9 g/dL   Globulin, Total 2.4 1.5 - 4.5 g/dL   Albumin/Globulin Ratio 1.8 1.2 - 2.2   Bilirubin Total <0.2 0.0 - 1.2 mg/dL   Alkaline Phosphatase 137 (H) 44 - 121 IU/L   AST 20 0 - 40 IU/L   ALT 31 0 - 32 IU/L  TSH  Result Value Ref Range   TSH 3.730 0.450 - 4.500 uIU/mL  T4, free  Result Value Ref Range   Free T4 1.13 0.82 - 1.77 ng/dL  Vitamin B12  Result Value Ref Range   Vitamin B-12 515 232 - 1,245 pg/mL  Iron Binding Cap (TIBC)([facility])  Result Value Ref Range   Total Iron Binding Capacity 335 250 - 450 ug/dL   UIBC 255 131 - 425 ug/dL  Iron 80 27 - 159 ug/dL   Iron Saturation 24 15 - 55 %  Ferritin  Result Value Ref Range   Ferritin 58 15 - 150 ng/mL      Assessment & Plan:   Problem List Items Addressed This Visit       Other    Obesity    BMI 29.78. At this time continue B12 and Optavia.  Discussed with patient in new year will try again to obtain [facility] for patient, as supply may be back in and coverage changes may take place.  Recommended eating smaller high protein, low fat meals more frequently and exercising 30 mins a day 5 times a week with a goal of 10-15lb weight loss in the next 3 months. Patient voiced their understanding and motivation to adhere to these recommendations.         Follow up plan: Return in about 14 weeks (around 10/19/2022) for Lymphoma, Hypothyroid, HLD, WEIGHT CHECK.

## 2022-07-13 NOTE — Assessment & Plan Note (Signed)
BMI 29.78. At this time continue B12 and Optavia.  Discussed with patient in new year will try again to obtain [facility] for patient, as supply may be back in and coverage changes may take place.  Recommended eating smaller high protein, low fat meals more frequently and exercising 30 mins a day 5 times a week with a goal of 10-15lb weight loss in the next 3 months. Patient voiced their understanding and motivation to adhere to these recommendations.

## 2022-07-21 DIAGNOSIS — E663 Overweight: Secondary | ICD-10-CM | POA: Diagnosis not present

## 2022-07-26 DIAGNOSIS — E663 Overweight: Secondary | ICD-10-CM | POA: Diagnosis not present

## 2022-08-12 DIAGNOSIS — K120 Recurrent oral aphthae: Secondary | ICD-10-CM | POA: Diagnosis not present

## 2022-08-12 DIAGNOSIS — J040 Acute laryngitis: Secondary | ICD-10-CM | POA: Diagnosis not present

## 2022-08-27 ENCOUNTER — Encounter: Payer: Self-pay | Admitting: Nurse Practitioner

## 2022-09-22 ENCOUNTER — Other Ambulatory Visit: Payer: Self-pay | Admitting: Nurse Practitioner

## 2022-09-22 NOTE — Telephone Encounter (Signed)
Requested Prescriptions  Pending Prescriptions Disp Refills   levothyroxine (SYNTHROID) 50 MCG tablet [Pharmacy Med Name: LEVOTHYROXINE 50 MCG TABLET] 90 tablet 3    Sig: TAKE 1 TABLET BY MOUTH EVERY DAY     Endocrinology:  Hypothyroid Agents Passed - 09/22/2022  1:22 AM      Passed - TSH in normal range and within 360 days    TSH  Date Value Ref Range Status  03/03/2022 3.730 0.450 - 4.500 uIU/mL Final         Passed - Valid encounter within last 12 months    Recent Outpatient Visits           2 months ago Class 1 obesity due to excess calories without serious comorbidity with body mass index (BMI) of 30.0 to 30.9 in adult   [facility], [place] T, NP   3 months ago Class 1 obesity due to excess calories without serious comorbidity with body mass index (BMI) of 30.0 to 30.9 in adult   [facility], [place] T, NP   5 months ago Class 1 obesity due to excess calories without serious comorbidity with body mass index (BMI) of 30.0 to 30.9 in adult   [facility], [place] T, NP   6 months ago 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

## 2022-10-10 ENCOUNTER — Other Ambulatory Visit: Payer: Self-pay | Admitting: Nurse Practitioner

## 2022-10-12 NOTE — Telephone Encounter (Signed)
Requested medication (s) are due for refill today:   Yes  Requested medication (s) are on the active medication list:   Yes  Future visit scheduled:   Yes 10/19/2022   Last ordered: 07/23/2021 #180, 4 refills  Returned because there is a note that pt. Is only taking 100 mg nightly instead of the ordered 200 mg.      Requested Prescriptions  Pending Prescriptions Disp Refills   gabapentin (NEURONTIN) 100 MG capsule [Pharmacy Med Name: GABAPENTIN 100 MG CAPSULE] 180 capsule 4    Sig: TAKE 2 CAPSULES BY MOUTH AT BEDTIME.     Neurology: Anticonvulsants - gabapentin Passed - 10/10/2022  3:45 PM      Passed - Cr in normal range and within 360 days    Creatinine  Date Value Ref Range Status  02/21/2014 0.85 0.60 - 1.30 mg/dL Final   Creatinine, Ser  Date Value Ref Range Status  03/03/2022 0.98 0.57 - 1.00 mg/dL Final         Passed - Completed PHQ-2 or PHQ-9 in the last 360 days      Passed - Valid encounter within last 12 months    Recent Outpatient Visits           3 months ago Class 1 obesity due to excess calories without serious comorbidity with body mass index (BMI) of 30.0 to 30.9 in adult   [place], [place] T, NP   4 months ago Class 1 obesity due to excess calories without serious comorbidity with body mass index (BMI) of 30.0 to 30.9 in adult   [place], [place] T, NP   6 months ago Class 1 obesity due to excess calories without serious comorbidity with body mass index (BMI) of 30.0 to 30.9 in adult   [place], [place] T, NP   7 months ago 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

## 2022-10-16 NOTE — Patient Instructions (Incomplete)
ZOXWRU( weekly injection), Zepbound (weekly injection), Victoza (daily injection)  Websites: [facility]  Healthy Eating Following a healthy eating pattern may help you to achieve and maintain a healthy body weight, reduce the risk of chronic disease, and live a long and productive life. It is important to follow a healthy eating pattern at an appropriate calorie level for your body. Your nutritional needs should be met primarily through food by choosing a variety of nutrient-rich foods. What are tips for following this plan? Reading food labels Read labels and choose the following: Reduced or low sodium. Juices with 100% fruit juice. Foods with low saturated fats and high polyunsaturated and monounsaturated fats. Foods with whole grains, such as whole wheat, cracked wheat, brown rice, and wild rice. Whole grains that are fortified with folic acid. This is recommended for women who are pregnant or who want to become pregnant. Read labels and avoid the following: Foods with a lot of added sugars. These include foods that contain brown sugar, corn sweetener, corn syrup, dextrose, fructose, glucose, high-fructose corn syrup, honey, invert sugar, lactose, malt syrup, maltose, molasses, raw sugar, sucrose, trehalose, or turbinado sugar. Do not eat more than the following amounts of added sugar per day: 6 teaspoons (25 g) for women. 9 teaspoons (38 g) for men. Foods that contain processed or refined starches and grains. Refined grain products, such as white flour, degermed cornmeal, white bread, and white rice. Shopping Choose nutrient-rich snacks, such as vegetables, whole fruits, and nuts. Avoid high-calorie and high-sugar snacks, such as potato chips, fruit snacks, and candy. Use oil-based dressings and spreads on foods instead of solid fats such as butter, stick margarine, or cream cheese. Limit pre-made sauces, mixes, and "instant" products such as flavored rice, instant  noodles, and ready-made pasta. Try more plant-protein sources, such as tofu, tempeh, black beans, edamame, lentils, nuts, and seeds. Explore eating plans such as the Mediterranean diet or vegetarian diet. Cooking Use oil to saut or stir-fry foods instead of solid fats such as butter, stick margarine, or lard. Try baking, boiling, grilling, or broiling instead of frying. Remove the fatty part of meats before cooking. Steam vegetables in water or broth. Meal planning  At meals, imagine dividing your plate into fourths: One-half of your plate is fruits and vegetables. One-fourth of your plate is whole grains. One-fourth of your plate is protein, especially lean meats, poultry, eggs, tofu, beans, or nuts. Include low-fat dairy as part of your daily diet. Lifestyle Choose healthy options in all settings, including home, work, school, restaurants, or stores. Prepare your food safely: Wash your hands after handling raw meats. Keep food preparation surfaces clean by regularly washing with hot, soapy water. Keep raw meats separate from ready-to-eat foods, such as fruits and vegetables. Cook seafood, meat, poultry, and eggs to the recommended internal temperature. Store foods at safe temperatures. In general: Keep cold foods at 88F (4.4C) or below. Keep hot foods at 188F (60C) or above. Keep your freezer at [facility] (-17.8C) or below. Foods are no longer safe to eat when they have been between the temperatures of 40-188F (4.4-60C) for more than 2 hours. What foods should I eat? Fruits Aim to eat 2 cup-equivalents of fresh, canned (in natural juice), or frozen fruits each day. Examples of 1 cup-equivalent of fruit include 1 small apple, 8 large strawberries, 1 cup canned fruit,  cup dried fruit, or 1 cup 100% juice. Vegetables Aim to eat 2-3 cup-equivalents of fresh and frozen vegetables each day, including different varieties  and colors. Examples of 1 cup-equivalent of vegetables include 2  medium carrots, 2 cups raw, leafy greens, 1 cup chopped vegetable (raw or cooked), or 1 medium baked potato. Grains Aim to eat 6 ounce-equivalents of whole grains each day. Examples of 1 ounce-equivalent of grains include 1 slice of bread, 1 cup ready-to-eat cereal, 3 cups popcorn, or  cup cooked rice, pasta, or cereal. Meats and other proteins Aim to eat 5-6 ounce-equivalents of protein each day. Examples of 1 ounce-equivalent of protein include 1 egg, 1/2 cup nuts or seeds, or 1 tablespoon (16 g) peanut butter. A cut of meat or fish that is the size of a deck of cards is about 3-4 ounce-equivalents. Of the protein you eat each week, try to have at least 8 ounces come from seafood. This includes salmon, trout, herring, and anchovies. Dairy Aim to eat 3 cup-equivalents of fat-free or low-fat dairy each day. Examples of 1 cup-equivalent of dairy include 1 cup (240 mL) milk, 8 ounces (250 g) yogurt, 1 ounces (44 g) natural cheese, or 1 cup (240 mL) fortified soy milk. Fats and oils Aim for about 5 teaspoons (21 g) per day. Choose monounsaturated fats, such as canola and olive oils, avocados, peanut butter, and most nuts, or polyunsaturated fats, such as sunflower, corn, and soybean oils, walnuts, pine nuts, sesame seeds, sunflower seeds, and flaxseed. Beverages Aim for six 8-oz glasses of water per day. Limit coffee to three to five 8-oz cups per day. Limit caffeinated beverages that have added calories, such as soda and energy drinks. Limit alcohol intake to no more than 1 drink a day for nonpregnant women and 2 drinks a day for men. One drink equals 12 oz of beer (355 mL), 5 oz of wine (148 mL), or 1 oz of hard liquor (44 mL). Seasoning and other foods Avoid adding excess amounts of salt to your foods. Try flavoring foods with herbs and spices instead of salt. Avoid adding sugar to foods. Try using oil-based dressings, sauces, and spreads instead of solid fats. This information is based on  general [place] nutrition guidelines. For more information, visit [employer]. Exact amounts may vary based on your nutrition needs. Summary A healthy eating plan may help you to maintain a healthy weight, reduce the risk of chronic diseases, and stay active throughout your life. Plan your meals. Make sure you eat the right portions of a variety of nutrient-rich foods. Try baking, boiling, grilling, or broiling instead of frying. Choose healthy options in all settings, including home, work, school, restaurants, or stores. This information is not intended to replace advice given to you by your health care provider. Make sure you discuss any questions you have with your health care provider. Document Revised: 02/17/2022 Document Reviewed: 05/05/2021 Elsevier Patient Education  2023 [employer].

## 2022-10-19 ENCOUNTER — Encounter: Payer: Self-pay | Admitting: Nurse Practitioner

## 2022-10-19 ENCOUNTER — Ambulatory Visit: Payer: BC Managed Care – PPO | Admitting: Nurse Practitioner

## 2022-10-19 VITALS — BP 114/78 | HR 65 | Temp 97.7°F | Ht 67.99 in | Wt 184.8 lb

## 2022-10-19 DIAGNOSIS — E6609 Other obesity due to excess calories: Secondary | ICD-10-CM

## 2022-10-19 DIAGNOSIS — E7800 Pure hypercholesterolemia, unspecified: Secondary | ICD-10-CM | POA: Diagnosis not present

## 2022-10-19 DIAGNOSIS — E039 Hypothyroidism, unspecified: Secondary | ICD-10-CM | POA: Diagnosis not present

## 2022-10-19 DIAGNOSIS — N951 Menopausal and female climacteric states: Secondary | ICD-10-CM

## 2022-10-19 DIAGNOSIS — E559 Vitamin D deficiency, unspecified: Secondary | ICD-10-CM

## 2022-10-19 DIAGNOSIS — D513 Other dietary vitamin B12 deficiency anemia: Secondary | ICD-10-CM

## 2022-10-19 DIAGNOSIS — F419 Anxiety disorder, unspecified: Secondary | ICD-10-CM

## 2022-10-19 DIAGNOSIS — C8331 Diffuse large B-cell lymphoma, lymph nodes of head, face, and neck: Secondary | ICD-10-CM | POA: Diagnosis not present

## 2022-10-19 DIAGNOSIS — Z6830 Body mass index (BMI) 30.0-30.9, adult: Secondary | ICD-10-CM

## 2022-10-19 DIAGNOSIS — G629 Polyneuropathy, unspecified: Secondary | ICD-10-CM | POA: Diagnosis not present

## 2022-10-19 MED ORDER — CYANOCOBALAMIN 1000 MCG/ML IJ SOLN: 1000.0000 ug | INTRAMUSCULAR | 3 refills | Status: DC

## 2022-10-19 MED ORDER — VENLAFAXINE HCL ER 37.5 MG PO CP24: 37.5000 mg | ORAL_CAPSULE | Freq: Every day | ORAL | 4 refills | Status: DC

## 2022-10-19 NOTE — Progress Notes (Signed)
BP 114/78   Pulse 65   Temp 97.7 F (36.5 C) (Oral)   Ht 5' 7.99" (1.727 m)   Wt 184 lb 12.8 oz (83.8 kg)   LMP  (LMP Unknown)   SpO2 97%   BMI 28.11 kg/m    Subjective:    Patient ID: Christina [name], female    DOB: 1961-04-12, 62 y.o.   MRN: [id number]  HPI: Christina [name] is a 62 y.o. female  Chief Complaint  Patient presents with   Hypothyroidism   Hyperlipidemia   Lymphoma   Weight Check   Neck Pain    Right side of neck, happens when her anxiety get up.   HYPOTHYROIDISM Continues on Levothyroxine 50 MCG.   Thyroid control status:stable Satisfied with current treatment? no Medication side effects: no Medication compliance: good compliance Etiology of hypothyroidism: unknown Recent dose adjustment:no Fatigue: yes - has not been able to get B12 filled Cold intolerance: no Heat intolerance: no Weight gain: no Weight loss: no Constipation: yes Diarrhea/loose stools: no Palpitations: none Lower extremity edema: no Anxiety/depressed mood: yes The 10-year ASCVD risk score (Arnett DK, et al., 2019) is: 2.5%   Values used to calculate the score:     Age: 57 years     Sex: Female     Is Non-Hispanic African American: No     Diabetic: No     Tobacco smoker: No     Systolic Blood Pressure: 130 mmHg     Is BP treated: No     HDL Cholesterol: 53 mg/dL     Total Cholesterol: 184 mg/dL   B-CELL LYMPHOMA: Is followed by oncology, last visit 12/07/21, no changes.  Returns annually. Has been getting pains in neck recently, things are annoying her quickly recently and wonders if it is stressors and tension. Work stressors and frustration with weight. Having increased hot flashes at night -- taking Gabapentin but when tries to increase feels more sluggish.  On review took Celexa (made her feel jittery) and Effexor in past.  Followed by Dr. Ouida Sills for weight management, has been frustrated with this. Phentermine made her too jittery.  Insurance will not cover  injectables or Contrave.  Took B12 shots every two weeks at weight management and felt more energy with these, has lost energy after stopping.    10/19/2022    8:59 AM 07/13/2022    9:33 AM 06/01/2022   11:07 AM 04/14/2022   10:02 AM 03/03/2022   10:25 AM  Depression screen PHQ 2/9  Decreased Interest 0 0 0 0 2  Down, Depressed, Hopeless 0 0 0 0 3  PHQ - 2 Score 0 0 0 0 5  Altered sleeping '3 2 2 '$ 0 3  Tired, decreased energy '3 2 2 2 1  '$ Change in appetite 3 2 0 2 0  Feeling bad or failure about yourself  0 0 0 1 0  Trouble concentrating 0 1 0 1 0  Moving slowly or fidgety/restless 0 0 0 0 0  Suicidal thoughts 0 0 0 0 0  PHQ-9 Score '9 7 4 6 9  '$ Difficult doing work/chores Not difficult at all Not difficult at all Somewhat difficult Not difficult at all Somewhat difficult       10/19/2022    8:59 AM 07/13/2022    9:33 AM 06/01/2022   11:07 AM 04/14/2022   10:03 AM  GAD 7 : Generalized Anxiety Score  Nervous, Anxious, on Edge 0 0 0 0  Control/stop worrying 2 0  0 0  Worry too much - different things 2 0 0 0  Trouble relaxing 2 2 0 0  Restless 0 0 0 0  Easily annoyed or irritable '3 2 1 1  '$ Afraid - awful might happen 0 0 0 0  Total GAD 7 Score '9 4 1 1  '$ Anxiety Difficulty Somewhat difficult Not difficult at all Not difficult at all Not difficult at all   Relevant past medical, surgical, family and social history reviewed and updated as indicated. Interim medical history since our last visit reviewed. Allergies and medications reviewed and updated.  Review of Systems  Constitutional:  Positive for fatigue. Negative for activity change, appetite change, diaphoresis and fever.  Respiratory:  Negative for cough, chest tightness, shortness of breath and wheezing.   Cardiovascular:  Negative for chest pain, palpitations and leg swelling.  Gastrointestinal: Negative.   Endocrine: Negative for cold intolerance, heat intolerance, polydipsia, polyphagia and polyuria.  Neurological: Negative.    Psychiatric/Behavioral:  Positive for sleep disturbance. Negative for suicidal ideas. The patient is nervous/anxious.     Per HPI unless specifically indicated above     Objective:    BP 114/78   Pulse 65   Temp 97.7 F (36.5 C) (Oral)   Ht 5' 7.99" (1.727 m)   Wt 184 lb 12.8 oz (83.8 kg)   LMP  (LMP Unknown)   SpO2 97%   BMI 28.11 kg/m   Wt Readings from Last 3 Encounters:  10/19/22 184 lb 12.8 oz (83.8 kg)  07/13/22 184 lb 8 oz (83.7 kg)  06/01/22 183 lb 12.8 oz (83.4 kg)    Physical Exam Vitals and nursing note reviewed.  Constitutional:      General: She is awake. She is not in acute distress.    Appearance: She is well-developed, well-groomed and overweight. She is not ill-appearing or toxic-appearing.  HENT:     Head: Normocephalic.     Right Ear: Hearing normal.     Left Ear: Hearing normal.  Eyes:     General: Lids are normal.        Right eye: No discharge.        Left eye: No discharge.     Conjunctiva/sclera: Conjunctivae normal.     Pupils: Pupils are equal, round, and reactive to light.  Neck:     Thyroid: No thyromegaly.     Vascular: No carotid bruit.  Cardiovascular:     Rate and Rhythm: Normal rate and regular rhythm.     Heart sounds: Normal heart sounds. No murmur heard.    No gallop.  Pulmonary:     Effort: Pulmonary effort is normal. No accessory muscle usage or respiratory distress.     Breath sounds: Normal breath sounds.  Abdominal:     General: Bowel sounds are normal.     Palpations: Abdomen is soft.  Musculoskeletal:     Cervical back: Normal range of motion and neck supple.     Right lower leg: No edema.     Left lower leg: No edema.  Lymphadenopathy:     Head:     Right side of head: No submental, submandibular, tonsillar, preauricular or posterior auricular adenopathy.     Left side of head: No submental, submandibular, tonsillar, preauricular or posterior auricular adenopathy.     Cervical: No cervical adenopathy.  Skin:     General: Skin is warm and dry.  Neurological:     Mental Status: She is alert and oriented to person, place, and time.  Psychiatric:        Attention and Perception: Attention normal.        Mood and Affect: Mood normal.        Behavior: Behavior normal. Behavior is cooperative.        Thought Content: Thought content normal.        Judgment: Judgment normal.    Results for orders placed or performed in visit on 03/03/22  CBC with Differential/Platelet  Result Value Ref Range   WBC 6.8 3.4 - 10.8 x10E3/uL   RBC 4.63 3.77 - 5.28 x10E6/uL   Hemoglobin 13.9 11.1 - 15.9 g/dL   Hematocrit 41.1 34.0 - 46.6 %   MCV 89 79 - 97 fL   MCH 30.0 26.6 - 33.0 pg   MCHC 33.8 31.5 - 35.7 g/dL   RDW 13.0 11.7 - 15.4 %   Platelets 259 150 - 450 x10E3/uL   Neutrophils 64 Not Estab. %   Lymphs 28 Not Estab. %   Monocytes 7 Not Estab. %   Eos 1 Not Estab. %   Basos 0 Not Estab. %   Neutrophils Absolute 4.3 1.4 - 7.0 x10E3/uL   Lymphocytes Absolute 1.9 0.7 - 3.1 x10E3/uL   Monocytes Absolute 0.5 0.1 - 0.9 x10E3/uL   EOS (ABSOLUTE) 0.1 0.0 - 0.4 x10E3/uL   Basophils Absolute 0.0 0.0 - 0.2 x10E3/uL   Immature Granulocytes 0 Not Estab. %   Immature Grans (Abs) 0.0 0.0 - 0.1 x10E3/uL  Comprehensive metabolic panel  Result Value Ref Range   Glucose 93 70 - 99 mg/dL   BUN 14 6 - 24 mg/dL   Creatinine, Ser 0.98 0.57 - 1.00 mg/dL   eGFR 66 >59 mL/min/1.73   BUN/Creatinine Ratio 14 9 - 23   Sodium 144 134 - 144 mmol/L   Potassium 4.9 3.5 - 5.2 mmol/L   Chloride 105 96 - 106 mmol/L   CO2 20 20 - 29 mmol/L   Calcium 9.7 8.7 - 10.2 mg/dL   Total Protein 6.8 6.0 - 8.5 g/dL   Albumin 4.4 3.8 - 4.9 g/dL   Globulin, Total 2.4 1.5 - 4.5 g/dL   Albumin/Globulin Ratio 1.8 1.2 - 2.2   Bilirubin Total <0.2 0.0 - 1.2 mg/dL   Alkaline Phosphatase 137 (H) 44 - 121 IU/L   AST 20 0 - 40 IU/L   ALT 31 0 - 32 IU/L  TSH  Result Value Ref Range   TSH 3.730 0.450 - 4.500 uIU/mL  T4, free  Result Value Ref Range    Free T4 1.13 0.82 - 1.77 ng/dL  Vitamin B12  Result Value Ref Range   Vitamin B-12 515 232 - 1,245 pg/mL  Iron Binding Cap (TIBC)([facility])  Result Value Ref Range   Total Iron Binding Capacity 335 250 - 450 ug/dL   UIBC 255 131 - 425 ug/dL   Iron 80 27 - 159 ug/dL   Iron Saturation 24 15 - 55 %  Ferritin  Result Value Ref Range   Ferritin 58 15 - 150 ng/mL      Assessment & Plan:   Problem List Items Addressed This Visit       Cardiovascular and Mediastinum   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  Recommend she increase Gabapentin to 300 MG nightly and will monitor, further adjust as needed.  Start Effexor for mood and hot flashes.        Endocrine   Hypothyroid    Chronic, ongoing.  Continue current  medication regimen and adjust as needed based on labs.  Thyroid labs today.      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  Continue collaboration with oncology, recent note reviewed.      Relevant Orders   CBC with Differential/Platelet     Other   Anxiety    Ongoing, suspect related to hormone shifts and stressors.  Start low dose Effexor at 37.5 MG daily which may benefit mood and hot flashes.  Denies SI/HI.  Return in 4 weeks.      Relevant Medications   venlafaxine XR (EFFEXOR XR) 37.5 MG 24 hr capsule   Elevated low density lipoprotein (LDL) cholesterol level    Noted on past labs, continue focus on diet and exercise. The 10-year ASCVD risk score (Arnett DK, et al., 2019) is: 2.5%   Values used to calculate the score:     Age: 51 years     Sex: Female     Is Non-Hispanic African American: No     Diabetic: No     Tobacco smoker: No     Systolic Blood Pressure: 017 mmHg     Is BP treated: No     HDL Cholesterol: 53 mg/dL     Total Cholesterol: 184 mg/dL       Relevant Orders    Lipid Panel w/o Chol/HDL Ratio   Comprehensive metabolic panel   Obesity    BMI 28.11.  Discussed with patient in new year will try again to obtain [facility] for patient, as supply may be back in and coverage changes may take place.  Recommended eating smaller high protein, low fat meals more frequently and exercising 30 mins a day 5 times a week with a goal of 10-15lb weight loss in the next 3 months. Patient voiced their understanding and motivation to adhere to these recommendations.       Other dietary vitamin B12 deficiency anemia    History of low levels improved with monthly injections, currently symptomatic with fatigue.  Continue injections every 2 weeks.  She will perform at home.        Relevant Medications   cyanocobalamin (VITAMIN B12) 1000 MCG/ML injection   Other Relevant Orders   Vitamin B12   Vitamin D deficiency    Ongoing, taking supplement daily.  Recheck level today.      Relevant Orders   VITAMIN D 25 Hydroxy (Vit-D Deficiency, Fractures)     Follow up plan: Return in about 4 weeks (around 11/16/2022) for [place].

## 2022-10-19 NOTE — Assessment & Plan Note (Signed)
Noted on past labs, continue focus on diet and exercise. The 10-year ASCVD risk score (Arnett DK, et al., 2019) is: 2.5%   Values used to calculate the score:     Age: 62 years     Sex: Female     Is Non-Hispanic African American: No     Diabetic: No     Tobacco smoker: No     Systolic Blood Pressure: 872 mmHg     Is BP treated: No     HDL Cholesterol: 53 mg/dL     Total Cholesterol: 184 mg/dL

## 2022-10-19 NOTE — Assessment & Plan Note (Signed)
BMI 28.11.  Discussed with patient in new year will try again to obtain [facility] for patient, as supply may be back in and coverage changes may take place.  Recommended eating smaller high protein, low fat meals more frequently and exercising 30 mins a day 5 times a week with a goal of 10-15lb weight loss in the next 3 months. Patient voiced their understanding and motivation to adhere to these recommendations.

## 2022-10-19 NOTE — Assessment & Plan Note (Signed)
History of low levels improved with monthly injections, currently symptomatic with fatigue.  Continue injections every 2 weeks.  She will perform at home.

## 2022-10-19 NOTE — Assessment & Plan Note (Signed)
Chronic, ongoing.  Continue current medication regimen and adjust as needed based on labs.  Thyroid labs today. 

## 2022-10-19 NOTE — Assessment & Plan Note (Signed)
Chronic, stable.  Continue collaboration with oncology, recent note reviewed.

## 2022-10-19 NOTE — Assessment & Plan Note (Signed)
Ongoing, taking supplement daily.  Recheck level today.

## 2022-10-19 NOTE — Assessment & Plan Note (Signed)
Chronic, ongoing.  Recommend she increase Gabapentin to 300 MG nightly and will monitor, further adjust as needed.  Start Effexor for mood and hot flashes.

## 2022-10-19 NOTE — Assessment & Plan Note (Signed)
Ongoing, suspect related to hormone shifts and stressors.  Start low dose Effexor at 37.5 MG daily which may benefit mood and hot flashes.  Denies SI/HI.  Return in 4 weeks.

## 2022-10-20 ENCOUNTER — Other Ambulatory Visit: Payer: Self-pay | Admitting: Nurse Practitioner

## 2022-10-20 DIAGNOSIS — E039 Hypothyroidism, unspecified: Secondary | ICD-10-CM

## 2022-10-20 LAB — COMPREHENSIVE METABOLIC PANEL
ALT: 17 IU/L (ref 0–32)
AST: 17 IU/L (ref 0–40)
Albumin/Globulin Ratio: 1.6 (ref 1.2–2.2)
Albumin: 4.1 g/dL (ref 3.8–4.9)
Alkaline Phosphatase: 121 IU/L (ref 44–121)
BUN/Creatinine Ratio: 12 (ref 12–28)
BUN: 14 mg/dL (ref 8–27)
Bilirubin Total: 0.2 mg/dL (ref 0.0–1.2)
CO2: 23 mmol/L (ref 20–29)
Calcium: 9.6 mg/dL (ref 8.7–10.3)
Chloride: 104 mmol/L (ref 96–106)
Creatinine, Ser: 1.18 mg/dL — ABNORMAL HIGH (ref 0.57–1.00)
Globulin, Total: 2.5 g/dL (ref 1.5–4.5)
Glucose: 93 mg/dL (ref 70–99)
Potassium: 4.5 mmol/L (ref 3.5–5.2)
Sodium: 140 mmol/L (ref 134–144)
Total Protein: 6.6 g/dL (ref 6.0–8.5)
eGFR: 53 mL/min/{1.73_m2} — ABNORMAL LOW (ref >59)

## 2022-10-20 LAB — CBC WITH DIFFERENTIAL/PLATELET
Basophils Absolute: 0 10*3/uL (ref 0.0–0.2)
Basos: 1 %
EOS (ABSOLUTE): 0 10*3/uL (ref 0.0–0.4)
Eos: 1 %
Hematocrit: 40.3 % (ref 34.0–46.6)
Hemoglobin: 12.9 g/dL (ref 11.1–15.9)
Immature Grans (Abs): 0 10*3/uL (ref 0.0–0.1)
Immature Granulocytes: 0 %
Lymphocytes Absolute: 1.7 10*3/uL (ref 0.7–3.1)
Lymphs: 28 %
MCH: 29.1 pg (ref 26.6–33.0)
MCHC: 32 g/dL (ref 31.5–35.7)
MCV: 91 fL (ref 79–97)
Monocytes Absolute: 0.5 10*3/uL (ref 0.1–0.9)
Monocytes: 8 %
Neutrophils Absolute: 3.9 10*3/uL (ref 1.4–7.0)
Neutrophils: 62 %
Platelets: 251 10*3/uL (ref 150–450)
RBC: 4.44 x10E6/uL (ref 3.77–5.28)
RDW: 13.5 % (ref 11.7–15.4)
WBC: 6.2 10*3/uL (ref 3.4–10.8)

## 2022-10-20 LAB — LIPID PANEL W/O CHOL/HDL RATIO
Cholesterol, Total: 209 mg/dL — ABNORMAL HIGH (ref 100–199)
HDL: 55 mg/dL (ref >39)
LDL Chol Calc (NIH): 131 mg/dL — ABNORMAL HIGH (ref 0–99)
Triglycerides: 128 mg/dL (ref 0–149)
VLDL Cholesterol Cal: 23 mg/dL (ref 5–40)

## 2022-10-20 LAB — TSH: TSH: 7.94 u[IU]/mL — ABNORMAL HIGH (ref 0.450–4.500)

## 2022-10-20 LAB — VITAMIN D 25 HYDROXY (VIT D DEFICIENCY, FRACTURES): Vit D, 25-Hydroxy: 28.1 ng/mL — ABNORMAL LOW (ref 30.0–100.0)

## 2022-10-20 LAB — VITAMIN B12: Vitamin B-12: 1079 pg/mL (ref 232–1245)

## 2022-10-20 LAB — T4, FREE: Free T4: 1.23 ng/dL (ref 0.82–1.77)

## 2022-10-20 MED ORDER — LEVOTHYROXINE SODIUM 75 MCG PO TABS: 75.0000 ug | ORAL_TABLET | Freq: Every day | ORAL | 5 refills | Status: DC

## 2022-10-20 NOTE — Progress Notes (Signed)
Contacted via [place] -- needs outpatient lab visit in 6 weeks please:)   Good afternoon Christina [name], your labs have returned: - TSH is elevated this check and Free T4 normal.  I would recommend we go up a little on Levothyroxine to 75 MCG and recheck labs outpatient in 6 weeks, as if thyroid more sluggish it can make you feel tired.   - Vitamin D still a little low, ensure to take Vitamin D3 2000 units daily. - Kidney function, creatinine and eGFR, came down a little this check.  We will recheck this in 6 weeks too.  Please ensure you are drinking plenty of water daily and avoid Ibuprofen products. - Remainder of labs look great.  Any questions? Keep being amazing!!  Thank you for allowing me to participate in your care.  I appreciate you. Kindest regards, Suhaan Perleberg

## 2022-11-04 ENCOUNTER — Encounter: Payer: Self-pay | Admitting: Oncology

## 2022-11-04 ENCOUNTER — Encounter: Payer: Self-pay | Admitting: Nurse Practitioner

## 2022-11-06 ENCOUNTER — Emergency Department: Payer: BC Managed Care – PPO

## 2022-11-06 ENCOUNTER — Emergency Department
Admission: EM | Admit: 2022-11-06 | Discharge: 2022-11-06 | Disposition: A | Discharge status: Home / Self Care | Payer: BC Managed Care – PPO | Attending: Emergency Medicine | Admitting: Emergency Medicine

## 2022-11-06 ENCOUNTER — Other Ambulatory Visit: Payer: Self-pay

## 2022-11-06 DIAGNOSIS — K56609 Unspecified intestinal obstruction, unspecified as to partial versus complete obstruction: Secondary | ICD-10-CM | POA: Diagnosis not present

## 2022-11-06 DIAGNOSIS — R112 Nausea with vomiting, unspecified: Secondary | ICD-10-CM | POA: Insufficient documentation

## 2022-11-06 DIAGNOSIS — Z1152 Encounter for screening for COVID-19: Secondary | ICD-10-CM | POA: Insufficient documentation

## 2022-11-06 DIAGNOSIS — D72829 Elevated white blood cell count, unspecified: Secondary | ICD-10-CM | POA: Diagnosis not present

## 2022-11-06 DIAGNOSIS — E039 Hypothyroidism, unspecified: Secondary | ICD-10-CM | POA: Diagnosis not present

## 2022-11-06 LAB — CBC
HCT: 43.6 % (ref 36.0–46.0)
Hemoglobin: 14.2 g/dL (ref 12.0–15.0)
MCH: 28.6 pg (ref 26.0–34.0)
MCHC: 32.6 g/dL (ref 30.0–36.0)
MCV: 87.9 fL (ref 80.0–100.0)
Platelets: 347 10*3/uL (ref 150–400)
RBC: 4.96 MIL/uL (ref 3.87–5.11)
RDW: 12.8 % (ref 11.5–15.5)
WBC: 11.7 10*3/uL — ABNORMAL HIGH (ref 4.0–10.5)
nRBC: 0 % (ref 0.0–0.2)

## 2022-11-06 LAB — COMPREHENSIVE METABOLIC PANEL
ALT: 18 U/L (ref 0–44)
AST: 21 U/L (ref 15–41)
Albumin: 4.3 g/dL (ref 3.5–5.0)
Alkaline Phosphatase: 106 U/L (ref 38–126)
Anion gap: 12 (ref 5–15)
BUN: 19 mg/dL (ref 6–20)
CO2: 23 mmol/L (ref 22–32)
Calcium: 9.8 mg/dL (ref 8.9–10.3)
Chloride: 104 mmol/L (ref 98–111)
Creatinine, Ser: 0.95 mg/dL (ref 0.44–1.00)
GFR, Estimated: >60 mL/min (ref >60)
Glucose, Bld: 108 mg/dL — ABNORMAL HIGH (ref 70–99)
Potassium: 3.8 mmol/L (ref 3.5–5.1)
Sodium: 139 mmol/L (ref 135–145)
Total Bilirubin: 0.7 mg/dL (ref 0.3–1.2)
Total Protein: 8.2 g/dL — ABNORMAL HIGH (ref 6.5–8.1)

## 2022-11-06 LAB — URINALYSIS, ROUTINE W REFLEX MICROSCOPIC
Bilirubin Urine: NEGATIVE
Glucose, UA: NEGATIVE mg/dL
Hgb urine dipstick: NEGATIVE
Ketones, ur: 5 mg/dL — AB
Leukocytes,Ua: NEGATIVE
Nitrite: NEGATIVE
Protein, ur: 30 mg/dL — AB
Specific Gravity, Urine: 1.031 — ABNORMAL HIGH (ref 1.005–1.030)
pH: 5 (ref 5.0–8.0)

## 2022-11-06 LAB — RESP PANEL BY RT-PCR (RSV, FLU A&B, COVID)  RVPGX2
Influenza A by PCR: NEGATIVE
Influenza B by PCR: NEGATIVE
Resp Syncytial Virus by PCR: NEGATIVE
SARS Coronavirus 2 by RT PCR: NEGATIVE

## 2022-11-06 LAB — LIPASE, BLOOD: Lipase: 26 U/L (ref 11–51)

## 2022-11-06 MED ORDER — METOCLOPRAMIDE HCL 5 MG/ML IJ SOLN
10.0000 mg | Freq: Once | INTRAMUSCULAR | Status: AC
  Administered 2022-11-06: 10 mg via INTRAVENOUS
  Filled 2022-11-06: qty 2

## 2022-11-06 MED ORDER — METOCLOPRAMIDE HCL 10 MG PO TABS: 10.0000 mg | ORAL_TABLET | Freq: Three times a day (TID) | ORAL | 0 refills | Status: DC | PRN

## 2022-11-06 MED ORDER — ONDANSETRON HCL 4 MG/2ML IJ SOLN
4.0000 mg | Freq: Once | INTRAMUSCULAR | Status: AC
  Administered 2022-11-06: 4 mg via INTRAVENOUS
  Filled 2022-11-06: qty 2

## 2022-11-06 MED ORDER — SODIUM CHLORIDE 0.9 % IV BOLUS
1000.0000 mL | Freq: Once | INTRAVENOUS | Status: AC
  Administered 2022-11-06: 1000 mL via INTRAVENOUS

## 2022-11-06 NOTE — ED Triage Notes (Signed)
Pt reports NV for 2 days. Denies diarrhea, abd pain, fevers or any other symptoms or exposures. Pt reports just cannot keep anything down.

## 2022-11-06 NOTE — ED Provider Notes (Signed)
[facility] Provider Note    Event Date/Time   First MD Initiated Contact with Patient 11/06/22 [phone]     (approximate)   History   Nausea and Emesis   HPI  Christina [name] is a 62 y.o. female with a history of B-cell lymphoma with completed treatment, hyperlipidemia, and hypothyroidism who presents with nausea and vomiting for the last 2 days, persistent course, nonbilious and nonbloody, and not associated with any abdominal pain, distention, or diarrhea.  She has no fever or chills and denies any body aches.  She does report some fatigue.  The patient states her last bowel movement was about 3 days ago but this is not unusual for her.  She denies eating anything out of the ordinary before the symptoms started and has not traveled recently.  I reviewed the past medical records.  The patient was most recently seen in the ED in March of last year for nausea, vomiting, and diarrhea with a negative workup at that time.  Her most recent outpatient encounter was with [facility] on 1/30 for follow up of her chronic conditions.   Physical Exam   Triage Vital Signs: ED Triage Vitals  Enc Vitals Group     BP 11/06/22 0911 125/85     Pulse Rate 11/06/22 0911 92     Resp 11/06/22 0911 17     Temp 11/06/22 0911 98.3 F (36.8 C)     Temp Source 11/06/22 0911 Oral     SpO2 11/06/22 0911 95 %     Weight 11/06/22 0854 185 lb 3 oz (84 kg)     Height 11/06/22 0854 5' 7"$  (1.702 m)     Head Circumference --      Peak Flow --      Pain Score 11/06/22 0854 0     Pain Loc --      Pain Edu? --      Excl. in [place]? --     Most recent vital signs: Vitals:   11/06/22 0911  BP: 125/85  Pulse: 92  Resp: 17  Temp: 98.3 F (36.8 C)  SpO2: 95%     General: Awake, no distress.  CV:  Good peripheral perfusion.  Resp:  Normal effort.  Abd:  Soft and nontender.  No distention.  Other:  Dry mucous membranes.   ED Results / Procedures / Treatments    Labs (all labs ordered are listed, but only abnormal results are displayed) Labs Reviewed  COMPREHENSIVE METABOLIC PANEL - Abnormal; Notable for the following components:      Result Value   Glucose, Bld 108 (*)    Total Protein 8.2 (*)    All other components within normal limits  CBC - Abnormal; Notable for the following components:   WBC 11.7 (*)    All other components within normal limits  URINALYSIS, ROUTINE W REFLEX MICROSCOPIC - Abnormal; Notable for the following components:   Color, Urine YELLOW (*)    APPearance HAZY (*)    Specific Gravity, Urine 1.031 (*)    Ketones, ur 5 (*)    Protein, ur 30 (*)    Bacteria, UA RARE (*)    All other components within normal limits  RESP PANEL BY RT-PCR (RSV, FLU A&B, COVID)  RVPGX2  LIPASE, BLOOD     EKG     RADIOLOGY  XR abdomen: I independently viewed and interpreted the images; there is a normal bowel gas pattern with no dilated bowel loops  PROCEDURES:  Critical Care performed: No  Procedures   MEDICATIONS ORDERED IN ED: Medications  sodium chloride 0.9 % bolus 1,000 mL (0 mLs Intravenous Stopped 11/06/22 1113)  ondansetron (ZOFRAN) injection 4 mg (4 mg Intravenous Given 11/06/22 1018)  metoCLOPramide (REGLAN) injection 10 mg (10 mg Intravenous Given 11/06/22 1113)     IMPRESSION / MDM / ASSESSMENT AND PLAN / ED COURSE  I reviewed the triage vital signs and the nursing notes.  62 year old female with PMH as noted above and status post cholecystectomy several years ago presents with persistent nausea and vomiting for the last 2 days with no abdominal pain, diarrhea, or constitutional symptoms.  Exam is unremarkable.  The abdomen is soft and nontender.  Differential diagnosis includes, but is not limited to, viral gastroenteritis, foodborne illness, gastritis, PUD, gastroparesis, pancreatitis, other hepatobiliary cause, influenza, COVID-19, other viral syndrome.  We will obtain lab workup, give fluids,  antiemetic, obtain abdominal plain films, and reassess.  Patient's presentation is most consistent with acute presentation with potential threat to life or bodily function.  ----------------------------------------- 1:05 PM on 11/06/2022 -----------------------------------------  Lab workup is unremarkable.  Electrolytes are normal.  LFTs and lipase are normal.  CBC shows minimal leukocytosis at 11.7.  Urinalysis shows few WBCs but is negative for nitrites, leukocyte esterase, and only rare bacteria which is not consistent with active UTI.  The patient has no urinary symptoms, therefore there is no indication to treat.  The patient had minimal relief with Zofran, however after Reglan and fluids she is now feeling much better and her nausea has resolved.  She is tolerating p.o. solids and liquids.  She feels well and would like to go home.  Overall I suspect foodborne illness, gastroenteritis, gastritis or other benign etiology.  EKG that was initially ordered was never completed.  However, the patient has no chest pain, no signs or symptoms to suggest ACS, and given her resolved vomiting there is no indication to do one now.  I counseled the patient on the results of the workup and on return precautions; she expressed understanding.   FINAL CLINICAL IMPRESSION(S) / ED DIAGNOSES   Final diagnoses:  Nausea and vomiting, unspecified vomiting type     Rx / DC Orders   ED Discharge Orders          Ordered    metoCLOPramide (REGLAN) 10 MG tablet  Every 8 hours PRN        11/06/22 1304             Note:  This document was prepared using Dragon voice recognition software and may include unintentional dictation errors.    Arta Silence, MD 11/06/22 [phone]

## 2022-11-06 NOTE — Discharge Instructions (Signed)
Take the Reglan as needed over the next few days.  Slowly advance her diet and do not eat anything too heavy or difficult to digest in the next several days.  Return to the ER for new, worsening, or persistent severe nausea or vomiting, any abdominal pain, fever, weakness, or any other new or worsening symptoms that concern you.

## 2022-11-06 NOTE — ED Notes (Signed)
Po challenge completed at this time. Pt had no S/S of N/V after eating crackers and drinking ginger ale.

## 2022-11-12 ENCOUNTER — Other Ambulatory Visit: Payer: Self-pay | Admitting: Nurse Practitioner

## 2022-11-12 NOTE — Telephone Encounter (Signed)
Last reordered 10/19/22 # 4 RF and sent to requesting pharmacy  Requested Prescriptions  Refused Prescriptions Disp Refills   venlafaxine XR (EFFEXOR-XR) 37.5 MG 24 hr capsule [Pharmacy Med Name: VENLAFAXINE HCL ER 37.5 MG CAP] 90 capsule 2    Sig: TAKE 1 CAPSULE BY MOUTH DAILY WITH BREAKFAST.     Psychiatry: Antidepressants - SNRI - desvenlafaxine & venlafaxine Failed - 11/12/2022  8:31 AM      Failed - Lipid Panel in normal range within the last 12 months    Cholesterol, Total  Date Value Ref Range Status  10/19/2022 209 (H) 100 - 199 mg/dL Final   LDL Chol Calc (NIH)  Date Value Ref Range Status  10/19/2022 131 (H) 0 - 99 mg/dL Final   HDL  Date Value Ref Range Status  10/19/2022 55 >39 mg/dL Final   Triglycerides  Date Value Ref Range Status  10/19/2022 128 0 - 149 mg/dL Final         Passed - Cr in normal range and within 360 days    Creatinine  Date Value Ref Range Status  02/21/2014 0.85 0.60 - 1.30 mg/dL Final   Creatinine, Ser  Date Value Ref Range Status  11/06/2022 0.95 0.44 - 1.00 mg/dL Final         Passed - Last BP in normal range    BP Readings from Last 1 Encounters:  11/06/22 122/82         Passed - Valid encounter within last 6 months    Recent Outpatient Visits           3 weeks ago 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[place])   [place], [place] T, NP   4 months ago Class 1 obesity due to excess calories without serious comorbidity with body mass index (BMI) of 30.0 to 30.9 in adult   [place], [place] T, NP   5 months ago Class 1 obesity due to excess calories without serious comorbidity with body mass index (BMI) of 30.0 to 30.9 in adult   [place], [place] T, NP   7 months ago Class 1 obesity due to excess calories without serious comorbidity with body mass index (BMI) of 30.0 to 30.9 in adult   [place], [place] T, NP   8 months ago 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

## 2022-11-13 NOTE — Patient Instructions (Signed)

## 2022-11-16 ENCOUNTER — Ambulatory Visit: Payer: BC Managed Care – PPO | Admitting: Nurse Practitioner

## 2022-11-16 ENCOUNTER — Encounter: Payer: Self-pay | Admitting: Nurse Practitioner

## 2022-11-16 VITALS — BP 114/77 | HR 61 | Temp 97.6°F | Ht 67.99 in | Wt 180.2 lb

## 2022-11-16 DIAGNOSIS — E6609 Other obesity due to excess calories: Secondary | ICD-10-CM

## 2022-11-16 DIAGNOSIS — Z6830 Body mass index (BMI) 30.0-30.9, adult: Secondary | ICD-10-CM

## 2022-11-16 DIAGNOSIS — E039 Hypothyroidism, unspecified: Secondary | ICD-10-CM | POA: Diagnosis not present

## 2022-11-16 DIAGNOSIS — N951 Menopausal and female climacteric states: Secondary | ICD-10-CM | POA: Diagnosis not present

## 2022-11-16 DIAGNOSIS — F419 Anxiety disorder, unspecified: Secondary | ICD-10-CM

## 2022-11-16 NOTE — Assessment & Plan Note (Signed)
Chronic, ongoing.  Continue current medication regimen and adjust as needed based on labs.  Thyroid labs today.

## 2022-11-16 NOTE — Progress Notes (Signed)
BP 114/77   Pulse 61   Temp 97.6 F (36.4 C) (Oral)   Ht 5' 7.99" (1.727 m)   Wt 180 lb 3.2 oz (81.7 kg)   LMP  (LMP Unknown)   SpO2 99%   BMI 27.41 kg/m    Subjective:    Patient ID: Christina [name], female    DOB: 08-13-1961, 62 y.o.   MRN: [id number]  HPI: Christina [name] is a 62 y.o. female  Chief Complaint  Patient presents with   Mood   Hot Flashes   MENOPAUSAL SYMPTOMS Ordered low dose Effexor 37.5 MG at visit on 10/19/22 -- has not started as of yet.  Did not tolerate Celexa in past.  She did try an online weight loss program with Ozempic injection (was to do take 10 and only took 5)  -- reports severe nausea with this and ended up in ER.    Remains sluggish in afternoon, adjust her Levothyroxine at recent visit on 10/19/21 -- increased dosing -- 75 MCG. Duration: uncontrolled Symptom severity: mild Hot flashes: improved with increase in Levothyroxine Night sweats:  improved with increase in Levothyroxine Sleep disturbances: improved with increase in Levothyroxine Vaginal dryness: no Dyspareunia:no Decreased libido: yes Emotional lability: yes Stress incontinence: no Hysterectomy: yes Absolute Contraindications to Hormonal Therapy:     Undiagnosed vaginal bleeding: no    Breast cancer: no    Endometrial cancer: no    Coronary disease: no    Cerebrovascular disease: no    Venous thromboembolic disease: no   Relevant past medical, surgical, family and social history reviewed and updated as indicated. Interim medical history since our last visit reviewed. Allergies and medications reviewed and updated.  Review of Systems  Constitutional:  Positive for fatigue. Negative for activity change, appetite change, diaphoresis and fever.  Respiratory:  Negative for cough, chest tightness, shortness of breath and wheezing.   Cardiovascular:  Negative for chest pain, palpitations and leg swelling.  Gastrointestinal: Negative.   Endocrine: Negative for cold intolerance  and heat intolerance.  Neurological: Negative.   Psychiatric/Behavioral:  Positive for sleep disturbance. Negative for decreased concentration and suicidal ideas. The patient is nervous/anxious.    Per HPI unless specifically indicated above     Objective:    BP 114/77   Pulse 61   Temp 97.6 F (36.4 C) (Oral)   Ht 5' 7.99" (1.727 m)   Wt 180 lb 3.2 oz (81.7 kg)   LMP  (LMP Unknown)   SpO2 99%   BMI 27.41 kg/m   Wt Readings from Last 3 Encounters:  11/16/22 180 lb 3.2 oz (81.7 kg)  11/06/22 185 lb 3 oz (84 kg)  10/19/22 184 lb 12.8 oz (83.8 kg)    Physical Exam Vitals and nursing note reviewed.  Constitutional:      General: She is awake. She is not in acute distress.    Appearance: She is well-developed and well-groomed. She is not ill-appearing or toxic-appearing.  HENT:     Head: Normocephalic.     Right Ear: Hearing normal.     Left Ear: Hearing normal.     Nose: Nose normal.     Mouth/Throat:     Mouth: Mucous membranes are moist.  Eyes:     General: Lids are normal.        Right eye: No discharge.        Left eye: No discharge.     Conjunctiva/sclera: Conjunctivae normal.     Pupils: Pupils are equal, round,  and reactive to light.  Neck:     Thyroid: No thyromegaly.     Vascular: No carotid bruit or JVD.  Cardiovascular:     Rate and Rhythm: Normal rate and regular rhythm.     Heart sounds: Normal heart sounds. No murmur heard.    No gallop.  Pulmonary:     Effort: Pulmonary effort is normal.     Breath sounds: Normal breath sounds.  Abdominal:     General: Bowel sounds are normal.     Palpations: Abdomen is soft.  Musculoskeletal:     Cervical back: Normal range of motion and neck supple.     Right lower leg: No edema.     Left lower leg: No edema.  Lymphadenopathy:     Cervical: No cervical adenopathy.  Skin:    General: Skin is warm and dry.  Neurological:     Mental Status: She is alert and oriented to person, place, and time.  Psychiatric:         Attention and Perception: Attention normal.        Mood and Affect: Mood normal.        Behavior: Behavior normal. Behavior is cooperative.        Thought Content: Thought content normal.        Judgment: Judgment normal.    Results for orders placed or performed during the hospital encounter of 11/06/22  Resp panel by RT-PCR (RSV, Flu A&B, Covid) Anterior Nasal Swab   Specimen: Anterior Nasal Swab  Result Value Ref Range   SARS Coronavirus 2 by RT PCR NEGATIVE NEGATIVE   Influenza A by PCR NEGATIVE NEGATIVE   Influenza B by PCR NEGATIVE NEGATIVE   Resp Syncytial Virus by PCR NEGATIVE NEGATIVE  Lipase, blood  Result Value Ref Range   Lipase 26 11 - 51 U/L  Comprehensive metabolic panel  Result Value Ref Range   Sodium 139 135 - 145 mmol/L   Potassium 3.8 3.5 - 5.1 mmol/L   Chloride 104 98 - 111 mmol/L   CO2 23 22 - 32 mmol/L   Glucose, Bld 108 (H) 70 - 99 mg/dL   BUN 19 6 - 20 mg/dL   Creatinine, Ser 0.95 0.44 - 1.00 mg/dL   Calcium 9.8 8.9 - 10.3 mg/dL   Total Protein 8.2 (H) 6.5 - 8.1 g/dL   Albumin 4.3 3.5 - 5.0 g/dL   AST 21 15 - 41 U/L   ALT 18 0 - 44 U/L   Alkaline Phosphatase 106 38 - 126 U/L   Total Bilirubin 0.7 0.3 - 1.2 mg/dL   GFR, Estimated >60 >60 mL/min   Anion gap 12 5 - 15  CBC  Result Value Ref Range   WBC 11.7 (H) 4.0 - 10.5 K/uL   RBC 4.96 3.87 - 5.11 MIL/uL   Hemoglobin 14.2 12.0 - 15.0 g/dL   HCT 43.6 36.0 - 46.0 %   MCV 87.9 80.0 - 100.0 fL   MCH 28.6 26.0 - 34.0 pg   MCHC 32.6 30.0 - 36.0 g/dL   RDW 12.8 11.5 - 15.5 %   Platelets 347 150 - 400 K/uL   nRBC 0.0 0.0 - 0.2 %  Urinalysis, Routine w reflex microscopic -Urine, Clean Catch  Result Value Ref Range   Color, Urine YELLOW (A) YELLOW   APPearance HAZY (A) CLEAR   Specific Gravity, Urine 1.031 (H) 1.005 - 1.030   pH 5.0 5.0 - 8.0   Glucose, UA NEGATIVE NEGATIVE mg/dL  Hgb urine dipstick NEGATIVE NEGATIVE   Bilirubin Urine NEGATIVE NEGATIVE   Ketones, ur 5 (A) NEGATIVE mg/dL    Protein, ur 30 (A) NEGATIVE mg/dL   Nitrite NEGATIVE NEGATIVE   Leukocytes,Ua NEGATIVE NEGATIVE   RBC / HPF 0-5 0 - 5 RBC/hpf   WBC, UA 6-10 0 - 5 WBC/hpf   Bacteria, UA RARE (A) NONE SEEN   Squamous Epithelial / HPF 0-5 0 - 5 /HPF   Mucus PRESENT       Assessment & Plan:   Problem List Items Addressed This Visit       Cardiovascular and Mediastinum   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  Recommend she continue Gabapentin 300 MG nightly and will monitor, further adjust as needed.  Started Effexor for mood and hot flashes last visit, but she has not started.  Will get her into Dr. Murrell Redden to further discussed her decreased libido and symptom management.      Relevant Orders   Ambulatory referral to Gynecology     Endocrine   Hypothyroid - Primary    Chronic, ongoing.  Continue current medication regimen and adjust as needed based on labs.  Thyroid labs today.      Relevant Orders   T4, free   TSH     Other   Obesity    BMI 27.41.  Discussed with patient in new year will try again to obtain [facility] for patient, as supply may be back in and coverage changes may take place.  Recommended eating smaller high protein, low fat meals more frequently and exercising 30 mins a day 5 times a week with a goal of 10-15lb weight loss in the next 3 months. Patient voiced their understanding and motivation to adhere to these recommendations.         Follow up plan: Return in about 3 months (around 02/14/2023) for Menopause follow-up after Dr. Murrell Redden visit.

## 2022-11-16 NOTE — Assessment & Plan Note (Signed)
Chronic, ongoing.  Recommend she continue Gabapentin 300 MG nightly and will monitor, further adjust as needed.  Started Effexor for mood and hot flashes last visit, but she has not started.  Will get her into Dr. Murrell Redden to further discussed her decreased libido and symptom management.

## 2022-11-16 NOTE — Assessment & Plan Note (Signed)
BMI 27.41.  Discussed with patient in new year will try again to obtain [facility] for patient, as supply may be back in and coverage changes may take place.  Recommended eating smaller high protein, low fat meals more frequently and exercising 30 mins a day 5 times a week with a goal of 10-15lb weight loss in the next 3 months. Patient voiced their understanding and motivation to adhere to these recommendations.

## 2022-11-17 LAB — TSH: TSH: 2.02 u[IU]/mL (ref 0.450–4.500)

## 2022-11-17 LAB — T4, FREE: Free T4: 1.72 ng/dL (ref 0.82–1.77)

## 2022-11-17 NOTE — Progress Notes (Signed)
Contacted via MyChart   Thyroid labs have improved this check into normal range.  This is good news!!  We will continue current medication dosing and work alongside menopause provider to see how we can help get some of your symptoms improved:)

## 2022-11-24 DIAGNOSIS — I7389 Other specified peripheral vascular diseases: Secondary | ICD-10-CM | POA: Diagnosis not present

## 2022-11-24 DIAGNOSIS — R5383 Other fatigue: Secondary | ICD-10-CM | POA: Diagnosis not present

## 2022-11-24 DIAGNOSIS — N951 Menopausal and female climacteric states: Secondary | ICD-10-CM | POA: Diagnosis not present

## 2022-11-30 ENCOUNTER — Telehealth: Payer: Self-pay | Admitting: Nurse Practitioner

## 2022-11-30 NOTE — Telephone Encounter (Signed)
Pt is calling because she received a message for an appt reminder for labs for tomorrow. Pt would like to ask Jolene does she want more labs? Labs were take 11/16/22. And there is not an order for more labs. Please advise CB- [phone]

## 2022-12-01 ENCOUNTER — Other Ambulatory Visit: Payer: BC Managed Care – PPO

## 2022-12-07 ENCOUNTER — Other Ambulatory Visit: Payer: Self-pay | Admitting: *Deleted

## 2022-12-07 DIAGNOSIS — C8331 Diffuse large B-cell lymphoma, lymph nodes of head, face, and neck: Secondary | ICD-10-CM

## 2022-12-07 DIAGNOSIS — Z08 Encounter for follow-up examination after completed treatment for malignant neoplasm: Secondary | ICD-10-CM

## 2022-12-08 ENCOUNTER — Inpatient Hospital Stay (HOSPITAL_BASED_OUTPATIENT_CLINIC_OR_DEPARTMENT_OTHER): Payer: BC Managed Care – PPO | Admitting: Oncology

## 2022-12-08 ENCOUNTER — Encounter: Payer: Self-pay | Admitting: Oncology

## 2022-12-08 ENCOUNTER — Inpatient Hospital Stay: Payer: BC Managed Care – PPO | Attending: Oncology

## 2022-12-08 VITALS — BP 125/81 | HR 82 | Temp 97.5°F | Resp 16 | Wt 180.8 lb

## 2022-12-08 DIAGNOSIS — Z79890 Hormone replacement therapy (postmenopausal): Secondary | ICD-10-CM | POA: Diagnosis not present

## 2022-12-08 DIAGNOSIS — E039 Hypothyroidism, unspecified: Secondary | ICD-10-CM | POA: Diagnosis not present

## 2022-12-08 DIAGNOSIS — Z79899 Other long term (current) drug therapy: Secondary | ICD-10-CM | POA: Insufficient documentation

## 2022-12-08 DIAGNOSIS — Z8249 Family history of ischemic heart disease and other diseases of the circulatory system: Secondary | ICD-10-CM | POA: Diagnosis not present

## 2022-12-08 DIAGNOSIS — Z801 Family history of malignant neoplasm of trachea, bronchus and lung: Secondary | ICD-10-CM | POA: Insufficient documentation

## 2022-12-08 DIAGNOSIS — C8331 Diffuse large B-cell lymphoma, lymph nodes of head, face, and neck: Secondary | ICD-10-CM

## 2022-12-08 DIAGNOSIS — Z803 Family history of malignant neoplasm of breast: Secondary | ICD-10-CM | POA: Diagnosis not present

## 2022-12-08 DIAGNOSIS — R591 Generalized enlarged lymph nodes: Secondary | ICD-10-CM | POA: Insufficient documentation

## 2022-12-08 DIAGNOSIS — Z8579 Personal history of other malignant neoplasms of lymphoid, hematopoietic and related tissues: Secondary | ICD-10-CM | POA: Diagnosis not present

## 2022-12-08 DIAGNOSIS — Z8042 Family history of malignant neoplasm of prostate: Secondary | ICD-10-CM | POA: Insufficient documentation

## 2022-12-08 DIAGNOSIS — Z9049 Acquired absence of other specified parts of digestive tract: Secondary | ICD-10-CM | POA: Diagnosis not present

## 2022-12-08 DIAGNOSIS — Z08 Encounter for follow-up examination after completed treatment for malignant neoplasm: Secondary | ICD-10-CM | POA: Diagnosis not present

## 2022-12-08 DIAGNOSIS — Z90710 Acquired absence of both cervix and uterus: Secondary | ICD-10-CM | POA: Insufficient documentation

## 2022-12-08 LAB — CMP (CANCER CENTER ONLY)
ALT: 17 U/L (ref 0–44)
AST: 16 U/L (ref 15–41)
Albumin: 4 g/dL (ref 3.5–5.0)
Alkaline Phosphatase: 118 U/L (ref 38–126)
Anion gap: 6 (ref 5–15)
BUN: 11 mg/dL (ref 6–20)
CO2: 28 mmol/L (ref 22–32)
Calcium: 9.3 mg/dL (ref 8.9–10.3)
Chloride: 104 mmol/L (ref 98–111)
Creatinine: 0.87 mg/dL (ref 0.44–1.00)
GFR, Estimated: >60 mL/min (ref >60)
Glucose, Bld: 97 mg/dL (ref 70–99)
Potassium: 3.8 mmol/L (ref 3.5–5.1)
Sodium: 138 mmol/L (ref 135–145)
Total Bilirubin: 0.4 mg/dL (ref 0.3–1.2)
Total Protein: 7.5 g/dL (ref 6.5–8.1)

## 2022-12-08 LAB — CBC WITH DIFFERENTIAL (CANCER CENTER ONLY)
Abs Immature Granulocytes: 0.02 10*3/uL (ref 0.00–0.07)
Basophils Absolute: 0 10*3/uL (ref 0.0–0.1)
Basophils Relative: 1 %
Eosinophils Absolute: 0.1 10*3/uL (ref 0.0–0.5)
Eosinophils Relative: 1 %
HCT: 41.8 % (ref 36.0–46.0)
Hemoglobin: 13.5 g/dL (ref 12.0–15.0)
Immature Granulocytes: 0 %
Lymphocytes Relative: 19 %
Lymphs Abs: 1.3 10*3/uL (ref 0.7–4.0)
MCH: 28.8 pg (ref 26.0–34.0)
MCHC: 32.3 g/dL (ref 30.0–36.0)
MCV: 89.3 fL (ref 80.0–100.0)
Monocytes Absolute: 0.7 10*3/uL (ref 0.1–1.0)
Monocytes Relative: 11 %
Neutro Abs: 4.7 10*3/uL (ref 1.7–7.7)
Neutrophils Relative %: 68 %
Platelet Count: 229 10*3/uL (ref 150–400)
RBC: 4.68 MIL/uL (ref 3.87–5.11)
RDW: 12.8 % (ref 11.5–15.5)
WBC Count: 6.8 10*3/uL (ref 4.0–10.5)
nRBC: 0 % (ref 0.0–0.2)

## 2022-12-08 NOTE — Progress Notes (Signed)
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 Patient is a 62 year old female who presented to her primary care doctor with symptoms of right neck swelling in January 2018. Her symptoms of an ongoing since December 2017. Patient works at a [employer] (medial in the swelling but the swelling did not go away. She was then referred to ENT.    2. CT of the soft tissue neck with contrast showed malignant right leg lymphadenopathy with heterogeneous enhancement and extracapsular extension occupying both the right level II and level III nodal stations. Individually B abnormal lymph nodes measure up to 4.2 cm in largest dimension and the conglomerulation of abnormal lymph nodes is 5-5.5 cm across. There is a small but asymmetric and conspicuity 7 mm lymph node along the lower right 3-B station. Mass effect on the right carotid space from the abnormal nodes but the major vascular structures in the neck at the skull base including the right IJ remained patent.   3. 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 Cells were CD20 positive and BCL 6 and partially dim BCL-2 positive. B cells were negative for C myc (10%). Mom one was positive in 50% of the cells. Ki-67 was 50%.Bone marrow biopsy was negative for lymphoma involvement   4.  PET/CT showed IMPRESSION: 1. FDG avid malignancy is identified  in the level 2 and 3 nodal stations of the right cervical lymph node chain, consistent with the patient's known lymphoma. 2. Focal uptake in the gastric cardia is suspicious for malignancy as well. Recommend direct visualization and biopsy as clinically Warranted.   5. EGD showed diffuse moderately erythematous mucosa without bleeding initial nonbleeding gastric ulcer 6 mm which was biopsied. Findings were consistent with H. pylori gastritis and patient was started on antibiotic therapy for the same. Repeat H pylori testing negative   6. EUS also showed H pylori. There were few B cells noted in lamina propria and clonality studies have been ordered. Initial testing for B cell clonality showed no monoclonal B-cell population. Confirmation testing also did not reveal clonal B cells   7. PET/CT scan after cycles of RCHOP showed: IMPRESSION: 1. Complete metabolic response.  No metabolically active lymphoma. 2. Uniform low-level marrow hypermetabolism throughout the axial skeleton, compatible with reactive marrow state due to chemotherapy. 3. No residual gastric hypermetabolism   8. She completed 3 cycles of RCHOP between feb 2018-aoril 2018 followed by IFRT to her neck in June 2018.  She has been in CR 1 since then      Interval history-she is doing well overall.  Continues with her full-time job and denies any changes in her appetite.  She is trying to lose weight.  Denies any drenching night sweats  ECOG PS- 0 Pain scale- 0   Review of systems- Review of Systems  Constitutional:  Negative for chills, fever, malaise/fatigue and weight loss.  HENT:  Negative for congestion, ear discharge and  nosebleeds.   Eyes:  Negative for blurred vision.  Respiratory:  Negative for cough, hemoptysis, sputum production, shortness of breath and wheezing.   Cardiovascular:  Negative for chest pain, palpitations, orthopnea and claudication.  Gastrointestinal:  Negative for abdominal pain, blood in stool,  constipation, diarrhea, heartburn, melena, nausea and vomiting.  Genitourinary:  Negative for dysuria, flank pain, frequency, hematuria and urgency.  Musculoskeletal:  Negative for back pain, joint pain and myalgias.  Skin:  Negative for rash.  Neurological:  Negative for dizziness, tingling, focal weakness, seizures, weakness and headaches.  Endo/Heme/Allergies:  Does not bruise/bleed easily.  Psychiatric/Behavioral:  Negative for depression and suicidal ideas. The patient does not have insomnia.       No Known Allergies   Past Medical History:  Diagnosis Date   B-cell lymphoma ([place])    Cancer ([place]) 2018   lymphoma, non hodgekins B cell   Chest pain 12/08/2016   Chest pain 12/08/2016   Dehydration 12/08/2016   GERD (gastroesophageal reflux disease)    Hypothyroidism      Past Surgical History:  Procedure Laterality Date   [place]   x 2    CHOLECYSTECTOMY N/A 10/04/2019   Procedure: LAPAROSCOPIC CHOLECYSTECTOMY;  Surgeon: Olean Ree, MD;  Location: [facility] ORS;  Service: General;  Laterality: N/A;   COLONOSCOPY WITH PROPOFOL N/A 09/28/2017   Procedure: COLONOSCOPY WITH PROPOFOL;  Surgeon: Jonathon Bellows, MD;  Location[facility] ENDOSCOPY;  Service: Gastroenterology;  Laterality: N/A;   ESOPHAGOGASTRODUODENOSCOPY (EGD) WITH PROPOFOL N/A 11/11/2016   Procedure: ESOPHAGOGASTRODUODENOSCOPY (EGD) WITH PROPOFOL;  Surgeon: Jonathon Bellows, MD;  Location: [facility] ENDOSCOPY;  Service: Endoscopy;  Laterality: N/A;   IR GENERIC HISTORICAL  11/03/2016   IR FLUORO GUIDE PORT INSERTION RIGHT 11/03/2016 Aletta Edouard, MD [facility]-INTERV RAD   IR REMOVAL TUN ACCESS W/ PORT W/O FL MOD SED  02/15/2017   REFRACTIVE SURGERY  09/2015   TOTAL ABDOMINAL HYSTERECTOMY      Social History   Socioeconomic History   Marital status: Single    Spouse name: Not on file   Number of children: Not on file   Years of education: Not on file   Highest education level: Not on file  Occupational History    Not on file  Tobacco Use   Smoking status: Never   Smokeless tobacco: Never  Vaping Use   Vaping Use: Never used  Substance and Sexual Activity   Alcohol use: No   Drug use: No   Sexual activity: Yes  Other Topics Concern   Not on file  Social History Narrative   Not on file   Social Determinants of Health   Financial Resource Strain: Not on file  Food Insecurity: Not on file  Transportation Needs: Not on file  Physical Activity: Not on file  Stress: Not on file  Social Connections: Not on file  Intimate Partner Violence: Not on file    Family History  Problem Relation Age of Onset   Breast cancer Mother 44   Cancer Mother        breast   Cancer Father        lung   Heart disease Brother 87       MI   Prostate cancer Brother    Heart disease Paternal Grandfather        MI     Current Outpatient Medications:    cyanocobalamin (VITAMIN B12) 1000 MCG/ML injection, Inject 1 mL (1,000 mcg total) into the muscle every 14 (fourteen) days., Disp:  30 mL, Rfl: 3   gabapentin (NEURONTIN) 100 MG capsule, Take 1 capsule (100 mg total) by mouth at bedtime., Disp: 90 capsule, Rfl: 4   levothyroxine (SYNTHROID) 75 MCG tablet, Take 1 tablet (75 mcg total) by mouth daily., Disp: 30 tablet, Rfl: 5   SYRINGE-NEEDLE, DISP, 3 ML (B-D 3CC LUER-LOK SYR 23GX1-1/2) 23G X 1-1/2" 3 ML MISC, TO USE FOR MONTHLY B 12 INJECTIONS., Disp: 50 each, Rfl: 1   metoCLOPramide (REGLAN) 10 MG tablet, Take 1 tablet (10 mg total) by mouth every 8 (eight) hours as needed for up to 5 days for nausea or vomiting., Disp: 15 tablet, Rfl: 0   venlafaxine XR (EFFEXOR XR) 37.5 MG 24 hr capsule, Take 1 capsule (37.5 mg total) by mouth daily with breakfast. (Patient not taking: Reported on 12/08/2022), Disp: 30 capsule, Rfl: 4  Physical exam:  Vitals:   12/08/22 1142  BP: 125/81  Pulse: 82  Resp: 16  Temp: (!) 97.5 F (36.4 C)  TempSrc: Tympanic  SpO2: 98%  Weight: 180 lb 12.8 oz (82 kg)   Physical  Exam Cardiovascular:     Rate and Rhythm: Normal rate and regular rhythm.     Heart sounds: Normal heart sounds.  Pulmonary:     Effort: Pulmonary effort is normal.     Breath sounds: Normal breath sounds.  Abdominal:     General: Bowel sounds are normal. There is no distension.     Palpations: Abdomen is soft.     Tenderness: There is no abdominal tenderness.     Comments: No palpable hepatosplenomegaly  Lymphadenopathy:     Comments: No palpable cervical, supraclavicular, axillary or inguinal adenopathy    Skin:    General: Skin is warm and dry.  Neurological:     Mental Status: She is alert and oriented to person, place, and time.         Latest Ref Rng & Units 12/08/2022   11:02 AM  CMP  Glucose 70 - 99 mg/dL 97   BUN 6 - 20 mg/dL 11   Creatinine 0.44 - 1.00 mg/dL 0.87   Sodium 135 - 145 mmol/L 138   Potassium 3.5 - 5.1 mmol/L 3.8   Chloride 98 - 111 mmol/L 104   CO2 22 - 32 mmol/L 28   Calcium 8.9 - 10.3 mg/dL 9.3   Total Protein 6.5 - 8.1 g/dL 7.5   Total Bilirubin 0.3 - 1.2 mg/dL 0.4   Alkaline Phos 38 - 126 U/L 118   AST 15 - 41 U/L 16   ALT 0 - 44 U/L 17       Latest Ref Rng & Units 12/08/2022   11:03 AM  CBC  WBC 4.0 - 10.5 K/uL 6.8   Hemoglobin 12.0 - 15.0 g/dL 13.5   Hematocrit 36.0 - 46.0 % 41.8   Platelets 150 - 400 K/uL 229      Assessment and plan- Patient is a 62 y.o. 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   Clinically patient is doing well with no concerning signs and symptoms of recurrence based on today's exam.  She is more than 5 years out from the time she has completed treatment for her diffuse large B-cell lymphoma.  I have again reassured the patient that there would be no reason to get routine PET scans or other surveillance scans at this time in the absence of any concerning signs and symptoms.  I will see her back in 1 year with labs   Visit Diagnosis 1. Encounter  for follow-up surveillance of diffuse large B-cell lymphoma      Dr. Randa Evens, MD, MPH [facility] [id number] 12/08/2022 1:17 PM

## 2023-01-04 DIAGNOSIS — H524 Presbyopia: Secondary | ICD-10-CM | POA: Diagnosis not present

## 2023-01-04 DIAGNOSIS — H25813 Combined forms of age-related cataract, bilateral: Secondary | ICD-10-CM | POA: Diagnosis not present

## 2023-02-13 NOTE — Patient Instructions (Signed)

## 2023-02-16 ENCOUNTER — Encounter: Payer: Self-pay | Admitting: Nurse Practitioner

## 2023-02-16 ENCOUNTER — Ambulatory Visit: Payer: BC Managed Care – PPO | Admitting: Nurse Practitioner

## 2023-02-16 VITALS — BP 114/78 | HR 54 | Temp 98.0°F | Ht 67.99 in | Wt 173.0 lb

## 2023-02-16 DIAGNOSIS — D513 Other dietary vitamin B12 deficiency anemia: Secondary | ICD-10-CM

## 2023-02-16 DIAGNOSIS — E039 Hypothyroidism, unspecified: Secondary | ICD-10-CM

## 2023-02-16 DIAGNOSIS — C8331 Diffuse large B-cell lymphoma, lymph nodes of head, face, and neck: Secondary | ICD-10-CM | POA: Diagnosis not present

## 2023-02-16 DIAGNOSIS — Z6826 Body mass index (BMI) 26.0-26.9, adult: Secondary | ICD-10-CM | POA: Diagnosis not present

## 2023-02-16 NOTE — Assessment & Plan Note (Signed)
Obtaining Ozempic via online program [facility]) -- will continue this and monitor weight closely + thyroid health.  Has lost 7 lbs, praised for this.

## 2023-02-16 NOTE — Assessment & Plan Note (Signed)
Chronic, ongoing.  Continue current medication regimen and adjust as needed based on labs.  Thyroid labs today.   ?

## 2023-02-16 NOTE — Assessment & Plan Note (Signed)
Chronic, stable.  Continue collaboration with oncology, recent note reviewed. 

## 2023-02-16 NOTE — Assessment & Plan Note (Signed)
History of low levels improved with monthly injections, currently symptomatic with fatigue.  Continue injections every 2 weeks.  She will perform at home.   

## 2023-02-16 NOTE — Progress Notes (Signed)
BP 114/78   Pulse (!) 54   Temp 98 F (36.7 C) (Oral)   Ht 5' 7.99" (1.727 m)   Wt 173 lb (78.5 kg)   LMP  (LMP Unknown)   SpO2 98%   BMI 26.31 kg/m    Subjective:    Patient ID: Christina [name], female    DOB: 1960-11-27, 62 y.o.   MRN: [id number]  HPI: Christina [name] is a 62 y.o. female  Chief Complaint  Patient presents with   Hypothyroidism   HYPOTHYROIDISM Continues on 75 MCG daily.  Is taking B12 injections every 14 days, which helps fatigue for a period -- notices fatigue closer to injection time.    Saw oncology 12/08/22 for Diffuse Large B-cell Lymphoma, plan to re scan only if something is off -- to return in one year.  She is using Ozempic which she is paying out of pocket for this via online program [facility] [place]) -- taking 0.5 MG weekly.  Is currently down 7 lbs.   Thyroid control status:stable Satisfied with current treatment? yes Medication side effects: no Medication compliance: good compliance Etiology of hypothyroidism:  Recent dose adjustment:yes Fatigue: yes Cold intolerance:  staying cool more Heat intolerance: no Weight gain: no Weight loss: no Constipation: no Diarrhea/loose stools: no Palpitations: no Lower extremity edema: no Anxiety/depressed mood: no     02/16/2023   10:29 AM 11/16/2022    9:37 AM 10/19/2022    8:59 AM 07/13/2022    9:33 AM 06/01/2022   11:07 AM  Depression screen PHQ 2/9  Decreased Interest 0  0 0 0  Down, Depressed, Hopeless 0  0 0 0  PHQ - 2 Score 0  0 0 0  Altered sleeping 2 3 3 2 2   Tired, decreased energy 2 3 3 2 2   Change in appetite 1 3 3 2  0  Feeling bad or failure about yourself  0  0 0 0  Trouble concentrating 0  0 1 0  Moving slowly or fidgety/restless 0  0 0 0  Suicidal thoughts 0  0 0 0  PHQ-9 Score 5  9 7 4   Difficult doing work/chores Not difficult at all Not difficult at all Not difficult at all Not difficult at all Somewhat difficult       02/16/2023   10:29 AM 11/16/2022    9:38 AM 10/19/2022     8:59 AM 07/13/2022    9:33 AM  GAD 7 : Generalized Anxiety Score  Nervous, Anxious, on Edge 0  0 0  Control/stop worrying 0  2 0  Worry too much - different things 0  2 0  Trouble relaxing 3 3 2 2   Restless 2  0 0  Easily annoyed or irritable 2 3 3 2   Afraid - awful might happen 0  0 0  Total GAD 7 Score 7  9 4   Anxiety Difficulty Not difficult at all Not difficult at all Somewhat difficult Not difficult at all      Relevant past medical, surgical, family and social history reviewed and updated as indicated. Interim medical history since our last visit reviewed. Allergies and medications reviewed and updated.  Review of Systems  Constitutional:  Positive for fatigue. Negative for activity change, appetite change, diaphoresis and fever.  Respiratory:  Negative for cough, chest tightness and shortness of breath.   Cardiovascular:  Negative for chest pain, palpitations and leg swelling.  Gastrointestinal: Negative.   Endocrine: Positive for cold intolerance. Negative for heat intolerance.  Neurological: Negative.   Psychiatric/Behavioral: Negative.      Per HPI unless specifically indicated above     Objective:    BP 114/78   Pulse (!) 54   Temp 98 F (36.7 C) (Oral)   Ht 5' 7.99" (1.727 m)   Wt 173 lb (78.5 kg)   LMP  (LMP Unknown)   SpO2 98%   BMI 26.31 kg/m   Wt Readings from Last 3 Encounters:  02/16/23 173 lb (78.5 kg)  12/08/22 180 lb 12.8 oz (82 kg)  11/16/22 180 lb 3.2 oz (81.7 kg)    Physical Exam Vitals and nursing note reviewed.  Constitutional:      General: She is awake. She is not in acute distress.    Appearance: She is well-developed and well-groomed. She is not ill-appearing or toxic-appearing.  HENT:     Head: Normocephalic.     Right Ear: Hearing normal.     Left Ear: Hearing normal.     Nose: Nose normal.     Mouth/Throat:     Mouth: Mucous membranes are moist.  Eyes:     General: Lids are normal.        Right eye: No discharge.         Left eye: No discharge.     Conjunctiva/sclera: Conjunctivae normal.     Pupils: Pupils are equal, round, and reactive to light.  Neck:     Thyroid: No thyromegaly.     Vascular: No carotid bruit or JVD.  Cardiovascular:     Rate and Rhythm: Normal rate and regular rhythm.     Heart sounds: Normal heart sounds. No murmur heard.    No gallop.  Pulmonary:     Effort: Pulmonary effort is normal.     Breath sounds: Normal breath sounds.  Abdominal:     General: Bowel sounds are normal.     Palpations: Abdomen is soft.  Musculoskeletal:     Cervical back: Normal range of motion and neck supple.     Right lower leg: No edema.     Left lower leg: No edema.  Lymphadenopathy:     Cervical: No cervical adenopathy.  Skin:    General: Skin is warm and dry.  Neurological:     Mental Status: She is alert and oriented to person, place, and time.  Psychiatric:        Attention and Perception: Attention normal.        Mood and Affect: Mood normal.        Behavior: Behavior normal. Behavior is cooperative.        Thought Content: Thought content normal.        Judgment: Judgment normal.     Results for orders placed or performed in visit on 12/08/22  CMP (Cancer Center only)  Result Value Ref Range   Sodium 138 135 - 145 mmol/L   Potassium 3.8 3.5 - 5.1 mmol/L   Chloride 104 98 - 111 mmol/L   CO2 28 22 - 32 mmol/L   Glucose, Bld 97 70 - 99 mg/dL   BUN 11 6 - 20 mg/dL   Creatinine 2.13 0.86 - 1.00 mg/dL   Calcium 9.3 8.9 - 57.8 mg/dL   Total Protein 7.5 6.5 - 8.1 g/dL   Albumin 4.0 3.5 - 5.0 g/dL   AST 16 15 - 41 U/L   ALT 17 0 - 44 U/L   Alkaline Phosphatase 118 38 - 126 U/L   Total Bilirubin 0.4 0.3 - 1.2  mg/dL   GFR, Estimated >16 >10 mL/min   Anion gap 6 5 - 15  CBC with Differential (Cancer Center Only)  Result Value Ref Range   WBC Count 6.8 4.0 - 10.5 K/uL   RBC 4.68 3.87 - 5.11 MIL/uL   Hemoglobin 13.5 12.0 - 15.0 g/dL   HCT 96.0 45.4 - 09.8 %   MCV 89.3 80.0 - 100.0  fL   MCH 28.8 26.0 - 34.0 pg   MCHC 32.3 30.0 - 36.0 g/dL   RDW 11.9 14.7 - 82.9 %   Platelet Count 229 150 - 400 K/uL   nRBC 0.0 0.0 - 0.2 %   Neutrophils Relative % 68 %   Neutro Abs 4.7 1.7 - 7.7 K/uL   Lymphocytes Relative 19 %   Lymphs Abs 1.3 0.7 - 4.0 K/uL   Monocytes Relative 11 %   Monocytes Absolute 0.7 0.1 - 1.0 K/uL   Eosinophils Relative 1 %   Eosinophils Absolute 0.1 0.0 - 0.5 K/uL   Basophils Relative 1 %   Basophils Absolute 0.0 0.0 - 0.1 K/uL   Immature Granulocytes 0 %   Abs Immature Granulocytes 0.02 0.00 - 0.07 K/uL      Assessment & Plan:   Problem List Items Addressed This Visit       Endocrine   Hypothyroid    Chronic, ongoing.  Continue current medication regimen and adjust as needed based on labs.  Thyroid labs today.      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  Continue collaboration with oncology, recent note reviewed.        Other   BMI 26.0-26.9,adult    Obtaining Ozempic via online program [facility]) -- will continue this and monitor weight closely + thyroid health.  Has lost 7 lbs, praised for this.      Other dietary vitamin B12 deficiency anemia    History of low levels improved with monthly injections, currently symptomatic with fatigue.  Continue injections every 2 weeks.  She will perform at home.        Relevant Orders   Vitamin B12     Follow up plan: Return in about 6 months (around 08/19/2023) for Annual Physical.

## 2023-02-17 LAB — TSH: TSH: 1.46 u[IU]/mL (ref 0.450–4.500)

## 2023-02-17 LAB — T4, FREE: Free T4: 1.66 ng/dL (ref 0.82–1.77)

## 2023-02-17 LAB — VITAMIN B12: Vitamin B-12: 1504 pg/mL — ABNORMAL HIGH (ref 232–1245)

## 2023-02-17 NOTE — Progress Notes (Signed)
Contacted via MyChart   Good afternoon Christina [name], your labs have returned and overall these remain stable.  B12 level a little high, we could try reducing B12 injections to monthly.  Thyroid levels normal.  Any questions? Keep being amazing!!  Thank you for allowing me to participate in your care.  I appreciate you. Kindest regards, Merridith Dershem

## 2023-03-30 ENCOUNTER — Other Ambulatory Visit: Payer: Self-pay | Admitting: Nurse Practitioner

## 2023-03-30 NOTE — Telephone Encounter (Signed)
Requested Prescriptions  Pending Prescriptions Disp Refills   cyanocobalamin (VITAMIN B12) 1000 MCG/ML injection [Pharmacy Med Name: CYANOCOBALAMIN 1,000 MCG/ML VL] 30 mL 0    Sig: INJECT 1 ML (1,000 MCG TOTAL) INTO THE MUSCLE EVERY 30 DAYS.     Endocrinology:  Vitamins - Vitamin B12 Failed - 03/30/2023  2:33 AM      Failed - B12 Level in normal range and within 360 days    Vitamin B-12  Date Value Ref Range Status  02/16/2023 1,504 (H) 232 - 1,245 pg/mL Final         Passed - HCT in normal range and within 360 days    HCT  Date Value Ref Range Status  12/08/2022 41.8 36.0 - 46.0 % Final   Hematocrit  Date Value Ref Range Status  10/19/2022 40.3 34.0 - 46.6 % Final         Passed - HGB in normal range and within 360 days    Hemoglobin  Date Value Ref Range Status  12/08/2022 13.5 12.0 - 15.0 g/dL Final  16/06/9603 54.0 11.1 - 15.9 g/dL Final         Passed - Valid encounter within last 12 months    Recent Outpatient Visits           1 month ago 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HCC)   [place] [facility] [place], [place] T, NP   4 months ago Hypothyroidism, unspecified type   [place] [facility] [place], [place] T, NP   5 months ago 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HCC)   [place] [facility] [place], [place] T, NP   8 months ago Class 1 obesity due to excess calories without serious comorbidity with body mass index (BMI) of 30.0 to 30.9 in adult   [place] [facility] [place], [place] T, NP   10 months ago Class 1 obesity due to excess calories without serious comorbidity with body mass index (BMI) of 30.0 to 30.9 in adult   [facility] [place], Dorie Rank, NP       Future Appointments             In 4 months Cannady, Dorie Rank, NP [place] [facility], [facility]             levothyroxine (SYNTHROID) 50 MCG tablet [Pharmacy Med  Name: LEVOTHYROXINE 50 MCG TABLET] 90 tablet 1    Sig: TAKE 1 TABLET BY MOUTH EVERY DAY     Endocrinology:  Hypothyroid Agents Passed - 03/30/2023  2:33 AM      Passed - TSH in normal range and within 360 days    TSH  Date Value Ref Range Status  02/16/2023 1.460 0.450 - 4.500 uIU/mL Final         Passed - Valid encounter within last 12 months    Recent Outpatient Visits           1 month ago 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HCC)   [place] [facility] [place], [place] T, NP   4 months ago Hypothyroidism, unspecified type   [place] [facility] [place], [place] T, NP   5 months ago 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HCC)   [place] [facility] [place], [place] T, NP   8 months ago Class 1 obesity due to excess calories without serious comorbidity  with body mass index (BMI) of 30.0 to 30.9 in adult   [place] [facility] [place], [place] T, NP   10 months ago Class 1 obesity due to excess calories without serious comorbidity with body mass index (BMI) of 30.0 to 30.9 in adult   [place] [facility] [place], Dorie Rank, NP       Future Appointments             In 4 months Cannady, Dorie Rank, NP [place] [facility], [facility]

## 2023-04-18 ENCOUNTER — Other Ambulatory Visit: Payer: Self-pay | Admitting: Nurse Practitioner

## 2023-04-19 NOTE — Telephone Encounter (Signed)
Requested Prescriptions  Pending Prescriptions Disp Refills   levothyroxine (SYNTHROID) 75 MCG tablet [Pharmacy Med Name: LEVOTHYROXINE 75 MCG TABLET] 90 tablet 3    Sig: TAKE 1 TABLET BY MOUTH EVERY DAY     Endocrinology:  Hypothyroid Agents Passed - 04/18/2023  2:32 AM      Passed - TSH in normal range and within 360 days    TSH  Date Value Ref Range Status  02/16/2023 1.460 0.450 - 4.500 uIU/mL Final         Passed - Valid encounter within last 12 months    Recent Outpatient Visits           2 months ago 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HCC)   [place] [facility] [place], [place] T, NP   5 months ago Hypothyroidism, unspecified type   [place] [facility] [place], [place] T, NP   6 months ago 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HCC)   [place] [facility] [place], Jolene T, NP   9 months ago Class 1 obesity due to excess calories without serious comorbidity with body mass index (BMI) of 30.0 to 30.9 in adult   [place] [facility] [place], [place] T, NP   10 months ago Class 1 obesity due to excess calories without serious comorbidity with body mass index (BMI) of 30.0 to 30.9 in adult   [place] [facility] [place], Dorie Rank, NP       Future Appointments             In 4 months Cannady, Dorie Rank, NP [place] [facility], [facility]

## 2023-06-07 ENCOUNTER — Ambulatory Visit: Payer: BC Managed Care – PPO | Admitting: Physician Assistant

## 2023-06-07 ENCOUNTER — Encounter: Payer: Self-pay | Admitting: Physician Assistant

## 2023-06-07 VITALS — BP 148/83 | HR 87 | Ht 67.0 in | Wt 182.8 lb

## 2023-06-07 DIAGNOSIS — J40 Bronchitis, not specified as acute or chronic: Secondary | ICD-10-CM

## 2023-06-07 DIAGNOSIS — R058 Other specified cough: Secondary | ICD-10-CM

## 2023-06-07 MED ORDER — PREDNISONE 20 MG PO TABS: ORAL_TABLET | ORAL | 0 refills | Status: DC

## 2023-06-07 MED ORDER — BENZONATATE 100 MG PO CAPS: 100.0000 mg | ORAL_CAPSULE | Freq: Two times a day (BID) | ORAL | 0 refills | Status: DC | PRN

## 2023-06-07 NOTE — Progress Notes (Signed)
Acute Office Visit   Patient: Christina [name]   DOB: 31-Aug-1961   62 y.o. Female  MRN: [id number] Visit Date: 06/07/2023  Today's healthcare provider: Oswaldo Conroy Bedie Dominey, PA-C  Introduced myself to the patient as a Secondary school teacher and provided education on APPs in clinical practice.    Chief Complaint  Patient presents with   Cough    Patient says she has been coughing for the past two weeks. Patient says her grandson recently got diagnosed with Bronchitis. Patient says they are rechecking him Thursday to make sure grandson doesn't have Pneumonia. Patient says she had tried over the Mucinex DM and does not have any other symptoms.    Subjective    HPI HPI     Cough    Additional comments: Patient says she has been coughing for the past two weeks. Patient says her grandson recently got diagnosed with Bronchitis. Patient says they are rechecking him Thursday to make sure grandson doesn't have Pneumonia. Patient says she had tried over the Mucinex DM and does not have any other symptoms.       Last edited by Malen Gauze, CMA on 06/07/2023  3:06 PM.       Concern for URI/ persistent cough  Onset: sudden  Duration: ongoing for about 2 weeks  Associated symptoms: she reports cough is intermittently productive with more severe fits, wheezing   Interventions: Mucinex   She reports her grandson was sick recently with similar symptoms  She has tested for COVID- tested Sunday and it was negative     Medications: Outpatient Medications Prior to Visit  Medication Sig   cyanocobalamin (VITAMIN B12) 1000 MCG/ML injection INJECT 1 ML (1,000 MCG TOTAL) INTO THE MUSCLE EVERY 30 DAYS.   gabapentin (NEURONTIN) 100 MG capsule Take 1 capsule (100 mg total) by mouth at bedtime.   levothyroxine (SYNTHROID) 75 MCG tablet TAKE 1 TABLET BY MOUTH EVERY DAY   OZEMPIC, 0.25 OR 0.5 MG/DOSE, 2 MG/3ML SOPN Inject 0.5 mg into the skin once a week.   SYRINGE-NEEDLE, DISP, 3 ML (B-D 3CC LUER-LOK SYR  23GX1-1/2) 23G X 1-1/2" 3 ML MISC TO USE FOR MONTHLY B 12 INJECTIONS.   venlafaxine XR (EFFEXOR XR) 37.5 MG 24 hr capsule Take 1 capsule (37.5 mg total) by mouth daily with breakfast.   metoCLOPramide (REGLAN) 10 MG tablet Take 1 tablet (10 mg total) by mouth every 8 (eight) hours as needed for up to 5 days for nausea or vomiting.   No facility-administered medications prior to visit.    Review of Systems  Constitutional:  Negative for chills and fever.  HENT:  Positive for voice change. Negative for congestion, ear pain, postnasal drip, rhinorrhea, sinus pain and sore throat.   Eyes:  Negative for visual disturbance.  Respiratory:  Positive for cough and wheezing. Negative for shortness of breath.   Gastrointestinal:  Negative for diarrhea, nausea and vomiting.  Musculoskeletal:  Negative for myalgias.  Neurological:  Negative for dizziness, light-headedness and headaches.        Objective    BP (!) 148/83   Pulse 87   Ht 5\' 7"  (1.702 m)   Wt 182 lb 12.8 oz (82.9 kg)   LMP  (LMP Unknown)   SpO2 96%   BMI 28.63 kg/m     Physical Exam Vitals reviewed.  Constitutional:      General: She is awake.     Appearance: Normal appearance. She is well-developed and well-groomed.  HENT:     Head: Normocephalic and atraumatic.     Right Ear: Hearing, tympanic membrane and ear canal normal.     Left Ear: Hearing, tympanic membrane and ear canal normal.     Mouth/Throat:     Lips: Pink.     Pharynx: No pharyngeal swelling, oropharyngeal exudate, posterior oropharyngeal erythema or uvula swelling.  Pulmonary:     Effort: Pulmonary effort is normal.     Breath sounds: No decreased air movement. Wheezing present. No decreased breath sounds, rhonchi or rales.     Comments: Mild wheezing present - mostly dry sounds globally  Neurological:     Mental Status: She is alert.  Psychiatric:        Attention and Perception: Attention and perception normal.        Mood and Affect: Mood and  affect normal.        Speech: Speech normal.        Behavior: Behavior normal. Behavior is cooperative.       No results found for any visits on 06/07/23.  Assessment & Plan      No follow-ups on file.      Problem List Items Addressed This Visit   None Visit Diagnoses     Bronchitis    -  Primary Acute, new concern She reports having predominantly dry cough with wheezing for the past two weeks She reports similar symptoms in her grandchild recently She reports negative home COVID test and is outside the recommended window for antivirals Exam was concerning for bronchitis - will send in prednisone taper and tessalon pearls to assist with symptoms and reduce inflammation Can continue to use OTC medications for further relief as desired If not improving with these measures recommend follow up and CXR for evaluation and pneumonia rule out Follow up as needed for persistent or progressing symptoms      Relevant Medications   predniSONE (DELTASONE) 20 MG tablet   Post-viral cough syndrome       Relevant Medications   predniSONE (DELTASONE) 20 MG tablet   benzonatate (TESSALON) 100 MG capsule        No follow-ups on file.   I, Aeden Matranga E Denissa Cozart, PA-C, have reviewed all documentation for this visit. The documentation on 06/07/23 for the exam, diagnosis, procedures, and orders are all accurate and complete.   Jacquelin Hawking, MHS, PA-C [facility]

## 2023-06-14 ENCOUNTER — Telehealth: Payer: BC Managed Care – PPO | Admitting: Nurse Practitioner

## 2023-08-20 NOTE — Patient Instructions (Signed)
Be Involved in Caring For Your Health:  Taking Medications When medications are taken as directed, they can greatly improve your health. But if they are not taken as prescribed, they may not work. In some cases, not taking them correctly can be harmful. To help ensure your treatment remains effective and safe, understand your medications and how to take them. Bring your medications to each visit for review by your provider.  Your lab results, notes, and after visit summary will be available on My Chart. We strongly encourage you to use this feature. If lab results are abnormal the clinic will contact you with the appropriate steps. If the clinic does not contact you assume the results are satisfactory. You can always view your results on My Chart. If you have questions regarding your health or results, please contact the clinic during office hours. You can also ask questions on My Chart.  We at [facility] are grateful that you chose [place] to provide your care. We strive to provide evidence-based and compassionate care and are always looking for feedback. If you get a survey from the clinic please complete this so we can hear your opinions.  Healthy Eating, Adult Healthy eating may help you get and keep a healthy body weight, reduce the risk of chronic disease, and live a long and productive life. It is important to follow a healthy eating pattern. Your nutritional and calorie needs should be met mainly by different nutrient-rich foods. What are tips for following this plan? Reading food labels Read labels and choose the following: Reduced or low sodium products. Juices with 100% fruit juice. Foods with low saturated fats (<3 g per serving) and high polyunsaturated and monounsaturated fats. Foods with whole grains, such as whole wheat, cracked wheat, brown rice, and wild rice. Whole grains that are fortified with folic acid. This is recommended for females who are pregnant or who want to  become pregnant. Read labels and do not eat or drink the following: Foods or drinks with added sugars. These include foods that contain brown sugar, corn sweetener, corn syrup, dextrose, fructose, glucose, high-fructose corn syrup, honey, invert sugar, lactose, malt syrup, maltose, molasses, raw sugar, sucrose, trehalose, or turbinado sugar. Limit your intake of added sugars to less than 10% of your total daily calories. Do not eat more than the following amounts of added sugar per day: 6 teaspoons (25 g) for females. 9 teaspoons (38 g) for males. Foods that contain processed or refined starches and grains. Refined grain products, such as white flour, degermed cornmeal, white bread, and white rice. Shopping Choose nutrient-rich snacks, such as vegetables, whole fruits, and nuts. Avoid high-calorie and high-sugar snacks, such as potato chips, fruit snacks, and candy. Use oil-based dressings and spreads on foods instead of solid fats such as butter, margarine, sour cream, or cream cheese. Limit pre-made sauces, mixes, and "instant" products such as flavored rice, instant noodles, and ready-made pasta. Try more plant-protein sources, such as tofu, tempeh, black beans, edamame, lentils, nuts, and seeds. Explore eating plans such as the Mediterranean diet or vegetarian diet. Try heart-healthy dips made with beans and healthy fats like hummus and guacamole. Vegetables go great with these. Cooking Use oil to saut or stir-fry foods instead of solid fats such as butter, margarine, or lard. Try baking, boiling, grilling, or broiling instead of frying. Remove the fatty part of meats before cooking. Steam vegetables in water or broth. Meal planning  At meals, imagine dividing your plate into fourths: One-half of  your plate is fruits and vegetables. One-fourth of your plate is whole grains. One-fourth of your plate is protein, especially lean meats, poultry, eggs, tofu, beans, or nuts. Include low-fat  dairy as part of your daily diet. Lifestyle Choose healthy options in all settings, including home, work, school, restaurants, or stores. Prepare your food safely: Wash your hands after handling raw meats. Where you prepare food, keep surfaces clean by regularly washing with hot, soapy water. Keep raw meats separate from ready-to-eat foods, such as fruits and vegetables. Cook seafood, meat, poultry, and eggs to the recommended temperature. Get a food thermometer. Store foods at safe temperatures. In general: Keep cold foods at 48F (4.4C) or below. Keep hot foods at 148F (60C) or above. Keep your freezer at [facility] (-17.8C) or below. Foods are not safe to eat if they have been between the temperatures of 40-148F (4.4-60C) for more than 2 hours. What foods should I eat? Fruits Aim to eat 1-2 cups of fresh, canned (in natural juice), or frozen fruits each day. One cup of fruit equals 1 small apple, 1 large banana, 8 large strawberries, 1 cup (237 g) canned fruit,  cup (82 g) dried fruit, or 1 cup (240 mL) 100% juice. Vegetables Aim to eat 2-4 cups of fresh and frozen vegetables each day, including different varieties and colors. One cup of vegetables equals 1 cup (91 g) broccoli or cauliflower florets, 2 medium carrots, 2 cups (150 g) raw, leafy greens, 1 large tomato, 1 large bell pepper, 1 large sweet potato, or 1 medium white potato. Grains Aim to eat 5-10 ounce-equivalents of whole grains each day. Examples of 1 ounce-equivalent of grains include 1 slice of bread, 1 cup (40 g) ready-to-eat cereal, 3 cups (24 g) popcorn, or  cup (93 g) cooked rice. Meats and other proteins Try to eat 5-7 ounce-equivalents of protein each day. Examples of 1 ounce-equivalent of protein include 1 egg,  oz nuts (12 almonds, 24 pistachios, or 7 walnut halves), 1/4 cup (90 g) cooked beans, 6 tablespoons (90 g) hummus or 1 tablespoon (16 g) peanut butter. A cut of meat or fish that is the size of a deck of  cards is about 3-4 ounce-equivalents (85 g). Of the protein you eat each week, try to have at least 8 sounce (227 g) of seafood. This is about 2 servings per week. This includes salmon, trout, herring, sardines, and anchovies. Dairy Aim to eat 3 cup-equivalents of fat-free or low-fat dairy each day. Examples of 1 cup-equivalent of dairy include 1 cup (240 mL) milk, 8 ounces (250 g) yogurt, 1 ounces (44 g) natural cheese, or 1 cup (240 mL) fortified soy milk. Fats and oils Aim for about 5 teaspoons (21 g) of fats and oils per day. Choose monounsaturated fats, such as canola and olive oils, mayonnaise made with olive oil or avocado oil, avocados, peanut butter, and most nuts, or polyunsaturated fats, such as sunflower, corn, and soybean oils, walnuts, pine nuts, sesame seeds, sunflower seeds, and flaxseed. Beverages Aim for 6 eight-ounce glasses of water per day. Limit coffee to 3-5 eight-ounce cups per day. Limit caffeinated beverages that have added calories, such as soda and energy drinks. If you drink alcohol: Limit how much you have to: 0-1 drink a day if you are female. 0-2 drinks a day if you are female. Know how much alcohol is in your drink. In the [place] one drink is one 12 oz bottle of beer (355 mL), one 5 oz glass of wine (  148 mL), or one 1 oz glass of hard liquor (44 mL). Seasoning and other foods Try not to add too much salt to your food. Try using herbs and spices instead of salt. Try not to add sugar to food. This information is based on [place] nutrition guidelines. To learn more, visit [employer]. Exact amounts may vary. You may need different amounts. This information is not intended to replace advice given to you by your health care provider. Make sure you discuss any questions you have with your health care provider. Document Revised: 06/07/2022 Document Reviewed: 06/07/2022 Elsevier Patient Education  2024 [employer].

## 2023-08-22 DIAGNOSIS — L821 Other seborrheic keratosis: Secondary | ICD-10-CM | POA: Diagnosis not present

## 2023-08-22 DIAGNOSIS — L720 Epidermal cyst: Secondary | ICD-10-CM | POA: Diagnosis not present

## 2023-08-23 ENCOUNTER — Encounter: Payer: Self-pay | Admitting: Nurse Practitioner

## 2023-08-23 ENCOUNTER — Ambulatory Visit (INDEPENDENT_AMBULATORY_CARE_PROVIDER_SITE_OTHER): Payer: BC Managed Care – PPO | Admitting: Nurse Practitioner

## 2023-08-23 VITALS — BP 115/73 | HR 67 | Temp 97.6°F | Ht 67.0 in | Wt 194.2 lb

## 2023-08-23 DIAGNOSIS — E66811 Obesity, class 1: Secondary | ICD-10-CM

## 2023-08-23 DIAGNOSIS — R052 Subacute cough: Secondary | ICD-10-CM

## 2023-08-23 DIAGNOSIS — F419 Anxiety disorder, unspecified: Secondary | ICD-10-CM | POA: Diagnosis not present

## 2023-08-23 DIAGNOSIS — C8331 Diffuse large B-cell lymphoma, lymph nodes of head, face, and neck: Secondary | ICD-10-CM

## 2023-08-23 DIAGNOSIS — N951 Menopausal and female climacteric states: Secondary | ICD-10-CM

## 2023-08-23 DIAGNOSIS — E559 Vitamin D deficiency, unspecified: Secondary | ICD-10-CM

## 2023-08-23 DIAGNOSIS — Z0000 Encounter for general adult medical examination without abnormal findings: Secondary | ICD-10-CM | POA: Diagnosis not present

## 2023-08-23 DIAGNOSIS — E669 Obesity, unspecified: Secondary | ICD-10-CM | POA: Insufficient documentation

## 2023-08-23 DIAGNOSIS — E7800 Pure hypercholesterolemia, unspecified: Secondary | ICD-10-CM

## 2023-08-23 DIAGNOSIS — E039 Hypothyroidism, unspecified: Secondary | ICD-10-CM

## 2023-08-23 DIAGNOSIS — D513 Other dietary vitamin B12 deficiency anemia: Secondary | ICD-10-CM

## 2023-08-23 DIAGNOSIS — Z1231 Encounter for screening mammogram for malignant neoplasm of breast: Secondary | ICD-10-CM

## 2023-08-23 DIAGNOSIS — Z6826 Body mass index (BMI) 26.0-26.9, adult: Secondary | ICD-10-CM

## 2023-08-23 DIAGNOSIS — E6609 Other obesity due to excess calories: Secondary | ICD-10-CM

## 2023-08-23 DIAGNOSIS — G629 Polyneuropathy, unspecified: Secondary | ICD-10-CM

## 2023-08-23 MED ORDER — AMOXICILLIN-POT CLAVULANATE 875-125 MG PO TABS: 1.0000 | ORAL_TABLET | Freq: Two times a day (BID) | ORAL | 0 refills | Status: AC

## 2023-08-23 NOTE — Assessment & Plan Note (Addendum)
History of low levels improved with injections.  Continue injections every 2 weeks.  She performs at home.

## 2023-08-23 NOTE — Assessment & Plan Note (Signed)
Noted on past labs, continue focus on diet and exercise.  Repeat labs today. The 10-year ASCVD risk score (Arnett DK, et al., 2019) is: 3.1%   Values used to calculate the score:     Age: 62 years     Sex: Female     Is Non-Hispanic African American: No     Diabetic: No     Tobacco smoker: No     Systolic Blood Pressure: 115 mmHg     Is BP treated: No     HDL Cholesterol: 55 mg/dL     Total Cholesterol: 209 mg/dL

## 2023-08-23 NOTE — Assessment & Plan Note (Signed)
BMI 30.42, she would like to start back on Semaglutide, but cost was a concern.  Discussed sending script to [facility] for compound, she is familiar with this pharmacy.  She would like script sent there as did lose weight with medication and tolerated.  No family history of thyroid cancer (MTC, MEN 2, thyroid cell tumors) or pancreatitis.   Will send script over.  Recommended eating smaller high protein, low fat meals more frequently and exercising 30 mins a day 5 times a week with a goal of 10-15lb weight loss in the next 3 months. Patient voiced their understanding and motivation to adhere to these recommendations.

## 2023-08-23 NOTE — Assessment & Plan Note (Signed)
Chronic, ongoing.  Recommend she continue Gabapentin 100 MG nightly and will monitor, further adjust as needed.  Refills as needed.

## 2023-08-23 NOTE — Progress Notes (Signed)
BP 115/73   Pulse 67   Temp 97.6 F (36.4 C) (Oral)   Ht 5\' 7"  (1.702 m)   Wt 194 lb 3.2 oz (88.1 kg)   LMP  (LMP Unknown)   SpO2 98%   BMI 30.42 kg/m    Subjective:    Patient ID: Christina [name], female    DOB: [birth year], 62 y.o.   MRN: [id number]  HPI: Christina [name] is a 62 y.o. female presenting on 08/23/2023 for comprehensive medical examination. Current medical complaints include:ongoing cough since September  She currently lives with: husband Menopausal Symptoms: no  COUGH Ongoing since URI in September.  Was seen in September and treated with Prednisone and Tessalon. Duration: weeks Circumstances of initial development of cough: URI Cough severity: nagging Cough description: hacking and dry Aggravating factors:  worse in the AM Alleviating factors: nothing Status:  fluctuating Treatments attempted: Prednisone and Tessalon Wheezing: yes Shortness of breath: no Chest pain: no Chest tightness:yes Nasal congestion: no Runny nose: no Postnasal drip: no Frequent throat clearing or swallowing: yes Hemoptysis: no Fevers: no Night sweats: no Weight loss: no Heartburn: no Recent foreign travel: no Tuberculosis contacts: no   HYPOTHYROIDISM Continues on Levothyroxine 75 MCG.  Continues B12 and Vitamin D supplements at home + Gabapentin at night for hot flashes. Thyroid control status:stable Satisfied with current treatment? no Medication side effects: no Medication compliance: good compliance Etiology of hypothyroidism: unknown Recent dose adjustment:no Fatigue: no Cold intolerance: no Heat intolerance: no Weight gain: no Weight loss: no Constipation: no Diarrhea/loose stools: no Palpitations: none Lower extremity edema: no Anxiety/depressed mood: yes The 10-year ASCVD risk score (Arnett DK, et al., 2019) is: 3.1%   Values used to calculate the score:     Age: 62 years     Sex: Female     Is Non-Hispanic African American: No     Diabetic: No      Tobacco smoker: No     Systolic Blood Pressure: 115 mmHg     Is BP treated: No     HDL Cholesterol: 55 mg/dL     Total Cholesterol: 209 mg/dL  B-CELL LYMPHOMA: Followed by oncology, last visit 12/08/22, no changes.  Returns annually.  Followed by Dr. Feliberto Gottron for weight management in past. Phentermine made her too jittery.  Insurance will not cover injectables or Contrave.  Taking B12 shots every two weeks. Weight continues to frustrate her, she was on Ozempic but not covered by insurance and was $800 a month.  Was losing weight with this.  Would like to return to taking.   Has worked out at gym and worked on diet changes for over 6 months.  Functional Status Survey: Is the patient deaf or have difficulty hearing?: No Does the patient have difficulty seeing, even when wearing glasses/contacts?: No Does the patient have difficulty concentrating, remembering, or making decisions?: No Does the patient have difficulty walking or climbing stairs?: No Does the patient have difficulty dressing or bathing?: No Does the patient have difficulty doing errands alone such as visiting a doctor's office or shopping?: No   Depression Screen done today and results listed below:     08/23/2023    1:10 PM 02/16/2023   10:29 AM 11/16/2022    9:37 AM 10/19/2022    8:59 AM 07/13/2022    9:33 AM  Depression screen PHQ 2/9  Decreased Interest 0 0  0 0  Down, Depressed, Hopeless 0 0  0 0  PHQ - 2 Score 0 0  0 0  Altered sleeping 3 2 3 3 2   Tired, decreased energy 3 2 3 3 2   Change in appetite 3 1 3 3 2   Feeling bad or failure about yourself  0 0  0 0  Trouble concentrating 0 0  0 1  Moving slowly or fidgety/restless 0 0  0 0  Suicidal thoughts 0 0  0 0  PHQ-9 Score 9 5  9 7   Difficult doing work/chores Somewhat difficult Not difficult at all Not difficult at all Not difficult at all Not difficult at all      08/23/2023    1:11 PM 02/16/2023   10:29 AM 11/16/2022    9:38 AM 10/19/2022    8:59 AM   GAD 7 : Generalized Anxiety Score  Nervous, Anxious, on Edge 0 0  0  Control/stop worrying 3 0  2  Worry too much - different things 0 0  2  Trouble relaxing 3 3 3 2   Restless 0 2  0  Easily annoyed or irritable 3 2 3 3   Afraid - awful might happen 0 0  0  Total GAD 7 Score 9 7  9   Anxiety Difficulty Somewhat difficult Not difficult at all Not difficult at all Somewhat difficult      06/01/2022   11:06 AM 07/13/2022    9:31 AM 10/19/2022    8:59 AM 11/16/2022    9:37 AM 08/23/2023    1:10 PM  Fall Risk  Falls in the past year? 0 0 0 0 0  Was there an injury with Fall? 0 0 0  0  Fall Risk Category Calculator 0 0 0  0  Fall Risk Category (Retired) Low Low     (RETIRED) Patient Fall Risk Level Low fall risk Low fall risk     Patient at Risk for Falls Due to No Fall Risks No Fall Risks No Fall Risks No Fall Risks No Fall Risks  Fall risk Follow up Falls evaluation completed Falls evaluation completed Falls evaluation completed  Falls evaluation completed    Past Medical History:  Past Medical History:  Diagnosis Date   B-cell lymphoma (HCC)    Cancer (HCC) 2018   lymphoma, non hodgekins B cell   Chest pain 12/08/2016   Chest pain 12/08/2016   Dehydration 12/08/2016   GERD (gastroesophageal reflux disease)    Hypothyroidism     Surgical History:  Past Surgical History:  Procedure Laterality Date   CESAREAN SECTION  1985, 1987   x 2    CHOLECYSTECTOMY N/A 10/04/2019   Procedure: LAPAROSCOPIC CHOLECYSTECTOMY;  Surgeon: Henrene Dodge, MD;  Location: [facility] ORS;  Service: General;  Laterality: N/A;   COLONOSCOPY WITH PROPOFOL N/A 09/28/2017   Procedure: COLONOSCOPY WITH PROPOFOL;  Surgeon: Wyline Mood, MD;  Location: [facility] ENDOSCOPY;  Service: Gastroenterology;  Laterality: N/A;   ESOPHAGOGASTRODUODENOSCOPY (EGD) WITH PROPOFOL N/A 11/11/2016   Procedure: ESOPHAGOGASTRODUODENOSCOPY (EGD) WITH PROPOFOL;  Surgeon: Wyline Mood, MD;  Location: [facility] ENDOSCOPY;  Service: Endoscopy;   Laterality: N/A;   IR GENERIC HISTORICAL  11/03/2016   IR FLUORO GUIDE PORT INSERTION RIGHT 11/03/2016 Irish Lack, MD [facility]INTER[facility] RAD   IR REMOVAL TUN ACCESS W/ PORT W/O FL MOD SED  02/15/2017   REFRACTIVE SURGERY  09/2015   TOTAL ABDOMINAL HYSTERECTOMY      Medications:  Current Outpatient Medications on File Prior to Visit  Medication Sig   cyanocobalamin (VITAMIN B12) 1000 MCG/ML injection INJECT 1 ML (1,000 MCG TOTAL) INTO THE MUSCLE EVERY 30  DAYS.   gabapentin (NEURONTIN) 100 MG capsule Take 1 capsule (100 mg total) by mouth at bedtime.   levothyroxine (SYNTHROID) 75 MCG tablet TAKE 1 TABLET BY MOUTH EVERY DAY   SYRINGE-NEEDLE, DISP, 3 ML (B-D 3CC LUER-LOK SYR 23GX1-1/2) 23G X 1-1/2" 3 ML MISC TO USE FOR MONTHLY B 12 INJECTIONS.   No current facility-administered medications on file prior to visit.    Allergies:  No Known Allergies  Social History:  Social History   Socioeconomic History   Marital status: Single    Spouse name: Not on file   Number of children: Not on file   Years of education: Not on file   Highest education level: Not on file  Occupational History   Not on file  Tobacco Use   Smoking status: Never   Smokeless tobacco: Never  Vaping Use   Vaping status: Never Used  Substance and Sexual Activity   Alcohol use: No   Drug use: No   Sexual activity: Yes  Other Topics Concern   Not on file  Social History Narrative   Not on file   Social Determinants of Health   Financial Resource Strain: Low Risk  (08/23/2023)   Overall Financial Resource Strain (CARDIA)    Difficulty of Paying Living Expenses: Not hard at all  Food Insecurity: No Food Insecurity (08/23/2023)   Hunger Vital Sign    Worried About Running Out of Food in the Last Year: Never true    Ran Out of Food in the Last Year: Never true  Transportation Needs: No Transportation Needs (08/23/2023)   PRAPARE - Administrator, Civil Service (Medical): No    Lack of  Transportation (Non-Medical): No  Physical Activity: Inactive (08/23/2023)   Exercise Vital Sign    Days of Exercise per Week: 0 days    Minutes of Exercise per Session: 0 min  Stress: Stress Concern Present (08/23/2023)   [employer] of Occupational Health - Occupational Stress Questionnaire    Feeling of Stress : To some extent  Social Connections: Socially Integrated (08/23/2023)   Social Connection and Isolation Panel [NHANES]    Frequency of Communication with Friends and Family: More than three times a week    Frequency of Social Gatherings with Friends and Family: Once a week    Attends Religious Services: More than 4 times per year    Active Member of [employer] or Organizations: Yes    Attends Engineer, structural: More than 4 times per year    Marital Status: Married  Catering manager Violence: Not At Risk (08/23/2023)   Humiliation, Afraid, Rape, and Kick questionnaire    Fear of Current or Ex-Partner: No    Emotionally Abused: No    Physically Abused: No    Sexually Abused: No   Social History   Tobacco Use  Smoking Status Never  Smokeless Tobacco Never   Social History   Substance and Sexual Activity  Alcohol Use No    Family History:  Family History  Problem Relation Age of Onset   Breast cancer Mother 30   Cancer Mother        breast   Cancer Father        lung   Heart disease Brother 74       MI   Prostate cancer Brother    Heart disease Paternal Grandfather        MI    Past medical history, surgical history, medications, allergies, family history and social  history reviewed with patient today and changes made to appropriate areas of the chart.   ROS All other ROS negative except what is listed above and in the HPI.      Objective:    BP 115/73   Pulse 67   Temp 97.6 F (36.4 C) (Oral)   Ht 5\' 7"  (1.702 m)   Wt 194 lb 3.2 oz (88.1 kg)   LMP  (LMP Unknown)   SpO2 98%   BMI 30.42 kg/m   Wt Readings from Last 3 Encounters:   08/23/23 194 lb 3.2 oz (88.1 kg)  06/07/23 182 lb 12.8 oz (82.9 kg)  02/16/23 173 lb (78.5 kg)    Physical Exam Vitals and nursing note reviewed. Exam conducted with a chaperone present.  Constitutional:      General: She is awake. She is not in acute distress.    Appearance: Normal appearance. She is well-developed and well-groomed. She is obese. She is not ill-appearing or toxic-appearing.  HENT:     Head: Normocephalic and atraumatic.     Right Ear: Hearing, ear canal and external ear normal. No drainage. A middle ear effusion is present. Tympanic membrane is not injected or perforated.     Left Ear: Hearing, ear canal and external ear normal. No drainage. A middle ear effusion is present. Tympanic membrane is not injected or perforated.     Nose: Nose normal. No rhinorrhea.     Right Sinus: No maxillary sinus tenderness or frontal sinus tenderness.     Left Sinus: No maxillary sinus tenderness or frontal sinus tenderness.     Mouth/Throat:     Mouth: Mucous membranes are moist.     Pharynx: Oropharynx is clear. Uvula midline. Posterior oropharyngeal erythema (mild) present. No pharyngeal swelling or oropharyngeal exudate.  Eyes:     General: Lids are normal.        Right eye: No discharge.        Left eye: No discharge.     Extraocular Movements: Extraocular movements intact.     Conjunctiva/sclera: Conjunctivae normal.     Pupils: Pupils are equal, round, and reactive to light.     Visual Fields: Right eye visual fields normal and left eye visual fields normal.  Neck:     Thyroid: No thyromegaly.     Vascular: No carotid bruit.     Trachea: Trachea normal.  Cardiovascular:     Rate and Rhythm: Normal rate and regular rhythm.     Heart sounds: Normal heart sounds. No murmur heard.    No gallop.  Pulmonary:     Effort: Pulmonary effort is normal. No accessory muscle usage or respiratory distress.     Breath sounds: Normal breath sounds. No decreased breath sounds, wheezing  or rhonchi.  Chest:     Comments: Deferred per patient request. Abdominal:     General: Bowel sounds are normal.     Palpations: Abdomen is soft. There is no hepatomegaly or splenomegaly.     Tenderness: There is no abdominal tenderness.  Musculoskeletal:        General: Normal range of motion.     Cervical back: Normal range of motion and neck supple.     Right lower leg: No edema.     Left lower leg: No edema.  Lymphadenopathy:     Head:     Right side of head: No submental, submandibular, tonsillar, preauricular or posterior auricular adenopathy.     Left side of head: No submental, submandibular, tonsillar,  preauricular or posterior auricular adenopathy.     Cervical: No cervical adenopathy.  Skin:    General: Skin is warm and dry.     Capillary Refill: Capillary refill takes less than 2 seconds.     Findings: No rash.  Neurological:     Mental Status: She is alert and oriented to person, place, and time.     Gait: Gait is intact.     Deep Tendon Reflexes: Reflexes are normal and symmetric.     Reflex Scores:      Brachioradialis reflexes are 2+ on the right side and 2+ on the left side.      Patellar reflexes are 2+ on the right side and 2+ on the left side. Psychiatric:        Attention and Perception: Attention normal.        Mood and Affect: Mood normal.        Speech: Speech normal.        Behavior: Behavior normal. Behavior is cooperative.        Thought Content: Thought content normal.        Judgment: Judgment normal.    Results for orders placed or performed in visit on 02/16/23  T4, free  Result Value Ref Range   Free T4 1.66 0.82 - 1.77 ng/dL  TSH  Result Value Ref Range   TSH 1.460 0.450 - 4.500 uIU/mL  Vitamin B12  Result Value Ref Range   Vitamin B-12 1,504 (H) 232 - 1,245 pg/mL      Assessment & Plan:   Problem List Items Addressed This Visit       Cardiovascular and Mediastinum   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  Recommend  she continue Gabapentin 100 MG nightly and will monitor, further adjust as needed.  Refills as needed.        Endocrine   Hypothyroid    Chronic, ongoing.  Continue current medication regimen and adjust as needed based on labs.  Thyroid labs today.      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  Continue collaboration with oncology, recent note reviewed.      Relevant Medications   amoxicillin-clavulanate (AUGMENTIN) 875-125 MG tablet   Other Relevant Orders   CBC with Differential/Platelet     Other   Anxiety    Ongoing, related to weight gain, which is a frustration.  Denies SI/HI. Reports is stable without medication, took Effexor in past.  Will continue without medication and restart in future as needed.      Elevated low density lipoprotein (LDL) cholesterol level    Noted on past labs, continue focus on diet and exercise.  Repeat labs today. The 10-year ASCVD risk score (Arnett DK, et al., 2019) is: 3.1%   Values used to calculate the score:     Age: 66 years     Sex: Female     Is Non-Hispanic African American: No     Diabetic: No     Tobacco smoker: No     Systolic Blood Pressure: 115 mmHg     Is BP treated: No     HDL Cholesterol: 55 mg/dL     Total Cholesterol: 209 mg/dL       Relevant Orders   Comprehensive metabolic panel   Lipid Panel w/o Chol/HDL Ratio  Obesity    BMI 30.42, she would like to start back on Semaglutide, but cost was a concern.  Discussed sending script to [facility] for compound, she is familiar with this pharmacy.  She would like script sent there as did lose weight with medication and tolerated.  No family history of thyroid cancer (MTC, MEN 2, thyroid cell tumors) or pancreatitis.   Will send script over.  Recommended eating smaller high protein, low fat meals more frequently and exercising 30 mins a day 5 times a week with a goal  of 10-15lb weight loss in the next 3 months. Patient voiced their understanding and motivation to adhere to these recommendations.       Other dietary vitamin B12 deficiency anemia    History of low levels improved with injections.  Continue injections every 2 weeks.  She performs at home.      Relevant Orders   Vitamin B12   Subacute cough    Ongoing since September after a URI.  Lung sounds reassuring today.  Will start Augmentin BID for 7 days and see if benefit.  She prefers not to take Prednisone or use Albuterol inhaler unless needed.  Imaging deferred today after discussion with patient, but if ongoing will obtain.  Recommend: Your symptoms and exam findings are most consistent with a viral upper respiratory infection. These usually run their course in 5-7 days. Unfortunately, antibiotics don't work against viruses and just increase your risk of other issues such as diarrhea, yeast infections, and resistant infections.  If you start feeling worse with facial pain, high fever, cough, shortness of breath or start feeling significantly worse, please call us right away to be further evaluated.  Some things that can make you feel better are: - Increased rest - Increasing Fluids - Acetaminophen / ibuprofen as needed for fever/pain.  - Salt water gargling, chloraseptic spray and throat lozenge - Mucinex.  - Humidifying the air.  If ongoing or worsening she is to notify provider.      Vitamin D deficiency    Ongoing, taking supplement daily.  Recheck level today.      Relevant Orders   VITAMIN D 25 Hydroxy (Vit-D Deficiency, Fractures)   Other Visit Diagnoses     Encounter for screening mammogram for malignant neoplasm of breast       Mammogram ordered and instructed how to schedule   Relevant Orders   MM 3D SCREENING MAMMOGRAM BILATERAL BREAST   Encounter for annual physical exam       Annual physical today with labs and health maintenance reviewed, discussed with patient.         Follow up plan: Return in about 3 months (around 11/21/2023) for WEIGHT CHECK.   LABORATORY TESTING:  - Pap smear: not applicable  IMMUNIZATIONS:   - Tdap: Tetanus vaccination status reviewed: last tetanus booster within 10 years. - Influenza: Refused - Pneumovax: Not applicable - Prevnar: Not applicable - COVID: Refused - HPV: Not applicable - Shingrix vaccine: Refused  SCREENING: -Mammogram: Ordered today  - Colonoscopy: Up to date  - Bone Density: Not applicable  -Hearing Test: Not applicable  -Spirometry: Not applicable   PATIENT COUNSELING:   Advised to take 1 mg of folate supplement per day if capable of pregnancy.   Sexuality: Discussed sexually transmitted diseases, partner selection, use of condoms, avoidance of unintended pregnancy  and contraceptive alternatives.   Advised to avoid cigarette smoking.  I discussed with the patient that most people either abstain from alcohol or drink  within safe limits (<=14/week and <=4 drinks/occasion for males, <=7/weeks and <= 3 drinks/occasion for females) and that the risk for alcohol disorders and other health effects rises proportionally with the number of drinks per week and how often a drinker exceeds daily limits.  Discussed cessation/primary prevention of drug use and availability of treatment for abuse.   Diet: Encouraged to adjust caloric intake to maintain  or achieve ideal body weight, to reduce intake of dietary saturated fat and total fat, to limit sodium intake by avoiding high sodium foods and not adding table salt, and to maintain adequate dietary potassium and calcium preferably from fresh fruits, vegetables, and low-fat dairy products.    Stressed the importance of regular exercise  Injury prevention: Discussed safety belts, safety helmets, smoke detector, smoking near bedding or upholstery.   Dental health: Discussed importance of regular tooth brushing, flossing, and dental visits.    NEXT  PREVENTATIVE PHYSICAL DUE IN 1 YEAR. Return in about 3 months (around 11/21/2023) for WEIGHT CHECK.

## 2023-08-23 NOTE — Assessment & Plan Note (Signed)
Chronic, ongoing.  Continue current medication regimen and adjust as needed based on labs.  Thyroid labs today. 

## 2023-08-23 NOTE — Assessment & Plan Note (Signed)
Ongoing, related to weight gain, which is a frustration.  Denies SI/HI. Reports is stable without medication, took Effexor in past.  Will continue without medication and restart in future as needed.

## 2023-08-23 NOTE — Assessment & Plan Note (Addendum)
Ongoing since September after a URI.  Lung sounds reassuring today.  Will start Augmentin BID for 7 days and see if benefit.  She prefers not to take Prednisone or use Albuterol inhaler unless needed.  Imaging deferred today after discussion with patient, but if ongoing will obtain.  Recommend: Your symptoms and exam findings are most consistent with a viral upper respiratory infection. These usually run their course in 5-7 days. Unfortunately, antibiotics don't work against viruses and just increase your risk of other issues such as diarrhea, yeast infections, and resistant infections.  If you start feeling worse with facial pain, high fever, cough, shortness of breath or start feeling significantly worse, please call us right away to be further evaluated.  Some things that can make you feel better are: - Increased rest - Increasing Fluids - Acetaminophen / ibuprofen as needed for fever/pain.  - Salt water gargling, chloraseptic spray and throat lozenge - Mucinex.  - Humidifying the air.  If ongoing or worsening she is to notify provider.

## 2023-08-23 NOTE — Assessment & Plan Note (Signed)
Ongoing, taking supplement daily.  Recheck level today.

## 2023-08-23 NOTE — Assessment & Plan Note (Signed)
Chronic, stable.  Continue collaboration with oncology, recent note reviewed. 

## 2023-08-24 LAB — LIPID PANEL W/O CHOL/HDL RATIO
Cholesterol, Total: 188 mg/dL (ref 100–199)
HDL: 50 mg/dL (ref >39)
LDL Chol Calc (NIH): 99 mg/dL (ref 0–99)
Triglycerides: 230 mg/dL — ABNORMAL HIGH (ref 0–149)
VLDL Cholesterol Cal: 39 mg/dL (ref 5–40)

## 2023-08-24 LAB — CBC WITH DIFFERENTIAL/PLATELET
Basophils Absolute: 0 10*3/uL (ref 0.0–0.2)
Basos: 1 %
EOS (ABSOLUTE): 0.1 10*3/uL (ref 0.0–0.4)
Eos: 1 %
Hematocrit: 41.8 % (ref 34.0–46.6)
Hemoglobin: 13.2 g/dL (ref 11.1–15.9)
Immature Grans (Abs): 0 10*3/uL (ref 0.0–0.1)
Immature Granulocytes: 0 %
Lymphocytes Absolute: 2.1 10*3/uL (ref 0.7–3.1)
Lymphs: 29 %
MCH: 28.7 pg (ref 26.6–33.0)
MCHC: 31.6 g/dL (ref 31.5–35.7)
MCV: 91 fL (ref 79–97)
Monocytes Absolute: 0.5 10*3/uL (ref 0.1–0.9)
Monocytes: 7 %
Neutrophils Absolute: 4.5 10*3/uL (ref 1.4–7.0)
Neutrophils: 62 %
Platelets: 302 10*3/uL (ref 150–450)
RBC: 4.6 x10E6/uL (ref 3.77–5.28)
RDW: 12.7 % (ref 11.7–15.4)
WBC: 7.2 10*3/uL (ref 3.4–10.8)

## 2023-08-24 LAB — COMPREHENSIVE METABOLIC PANEL
ALT: 22 [IU]/L (ref 0–32)
AST: 23 [IU]/L (ref 0–40)
Albumin: 4.3 g/dL (ref 3.9–4.9)
Alkaline Phosphatase: 129 [IU]/L — ABNORMAL HIGH (ref 44–121)
BUN/Creatinine Ratio: 13 (ref 12–28)
BUN: 12 mg/dL (ref 8–27)
Bilirubin Total: <0.2 mg/dL (ref 0.0–1.2)
CO2: 21 mmol/L (ref 20–29)
Calcium: 9.4 mg/dL (ref 8.7–10.3)
Chloride: 105 mmol/L (ref 96–106)
Creatinine, Ser: 0.94 mg/dL (ref 0.57–1.00)
Globulin, Total: 2.3 g/dL (ref 1.5–4.5)
Glucose: 99 mg/dL (ref 70–99)
Potassium: 4.2 mmol/L (ref 3.5–5.2)
Sodium: 141 mmol/L (ref 134–144)
Total Protein: 6.6 g/dL (ref 6.0–8.5)
eGFR: 69 mL/min/{1.73_m2} (ref >59)

## 2023-08-24 LAB — T4, FREE: Free T4: 1.36 ng/dL (ref 0.82–1.77)

## 2023-08-24 LAB — TSH: TSH: 2.85 u[IU]/mL (ref 0.450–4.500)

## 2023-08-24 LAB — VITAMIN D 25 HYDROXY (VIT D DEFICIENCY, FRACTURES): Vit D, 25-Hydroxy: 28.7 ng/mL — ABNORMAL LOW (ref 30.0–100.0)

## 2023-08-24 LAB — VITAMIN B12: Vitamin B-12: 828 pg/mL (ref 232–1245)

## 2023-08-24 NOTE — Progress Notes (Signed)
Contacted via MyChart   Good morning Christina [name], your labs have returned and overall remain stable with exception of mild elevation in triglycerides -- focus on healthy diet changes for this plus regular activity.  Vitamin D is mildly low, ensure to take Vitamin D3 2000 units daily.  Any questions? Keep being amazing!!  Thank you for allowing me to participate in your care.  I appreciate you. Kindest regards, Barbette Mcglaun

## 2023-10-27 ENCOUNTER — Ambulatory Visit
Admission: RE | Admit: 2023-10-27 | Discharge: 2023-10-27 | Disposition: A | Discharge status: Home / Self Care | Payer: BC Managed Care – PPO | Source: Ambulatory Visit | Attending: Nurse Practitioner | Admitting: Nurse Practitioner

## 2023-10-27 DIAGNOSIS — Z1231 Encounter for screening mammogram for malignant neoplasm of breast: Secondary | ICD-10-CM | POA: Diagnosis not present

## 2023-10-31 ENCOUNTER — Encounter: Payer: Self-pay | Admitting: Nurse Practitioner

## 2023-11-01 ENCOUNTER — Encounter: Payer: Self-pay | Admitting: Nurse Practitioner

## 2023-11-01 NOTE — Progress Notes (Signed)
Contacted via MyChart   Normal mammogram, may repeat in one year:)

## 2023-11-02 ENCOUNTER — Other Ambulatory Visit: Payer: Self-pay | Admitting: Nurse Practitioner

## 2023-11-02 ENCOUNTER — Encounter: Payer: Self-pay | Admitting: Nurse Practitioner

## 2023-11-02 DIAGNOSIS — R052 Subacute cough: Secondary | ICD-10-CM

## 2023-11-02 NOTE — Progress Notes (Signed)
Chest imaging due to ongoing cough

## 2023-11-03 ENCOUNTER — Encounter: Payer: Self-pay | Admitting: Nurse Practitioner

## 2023-11-03 ENCOUNTER — Ambulatory Visit
Admission: RE | Admit: 2023-11-03 | Discharge: 2023-11-03 | Disposition: A | Discharge status: Home / Self Care | Payer: BC Managed Care – PPO | Source: Ambulatory Visit | Attending: Nurse Practitioner | Admitting: Nurse Practitioner

## 2023-11-03 ENCOUNTER — Ambulatory Visit
Admission: RE | Admit: 2023-11-03 | Discharge: 2023-11-03 | Disposition: A | Discharge status: Home / Self Care | Payer: BC Managed Care – PPO | Attending: Nurse Practitioner | Admitting: Nurse Practitioner

## 2023-11-03 ENCOUNTER — Ambulatory Visit: Payer: BC Managed Care – PPO | Admitting: Nurse Practitioner

## 2023-11-03 VITALS — BP 114/74 | HR 71 | Temp 98.1°F | Ht 67.0 in | Wt 201.2 lb

## 2023-11-03 DIAGNOSIS — R052 Subacute cough: Secondary | ICD-10-CM | POA: Diagnosis not present

## 2023-11-03 DIAGNOSIS — J929 Pleural plaque without asbestos: Secondary | ICD-10-CM | POA: Diagnosis not present

## 2023-11-03 DIAGNOSIS — R059 Cough, unspecified: Secondary | ICD-10-CM | POA: Diagnosis not present

## 2023-11-03 MED ORDER — PREDNISONE 20 MG PO TABS: 40.0000 mg | ORAL_TABLET | Freq: Every day | ORAL | 0 refills | Status: AC

## 2023-11-03 MED ORDER — LEVOFLOXACIN 750 MG PO TABS: 750.0000 mg | ORAL_TABLET | Freq: Every day | ORAL | 0 refills | Status: AC

## 2023-11-03 NOTE — Patient Instructions (Signed)

## 2023-11-03 NOTE — Progress Notes (Signed)
Contacted via MyChart   Good evening Deetra, your imaging has returned.  It does not show any pneumonia, but I am concerned about this ongoing cough.  We will see if Levofloxacin can help this and do 5 days of Prednisone.  Levofloxacin possible side effect is achilles tendon rupture, very rare but I do like to alert patients to this.  Monitor for any severe ankle pain and if presents let me know and stop medication.  Any questions?  If this continues then next step would be getting a CT to further assess lungs. Keep being stellar!!  Thank you for allowing me to participate in your care.  I appreciate you. Kindest regards, Ashely Goosby

## 2023-11-03 NOTE — Progress Notes (Signed)
BP 114/74 (BP Location: Right Arm, Patient Position: Sitting, Cuff Size: Normal)   Pulse 71   Temp 98.1 F (36.7 C) (Oral)   Ht 5\' 7"  (1.702 m)   Wt 201 lb 3.2 oz (91.3 kg)   LMP  (LMP Unknown)   SpO2 98%   BMI 31.51 kg/m    Subjective:    Patient ID: Christina [name], female    DOB: Sep 06, 1961, 63 y.o.   MRN: [id number]  HPI: Christina [name] is a 63 y.o. female  Chief Complaint  Patient presents with   Cough    Pt having back pain when coughing and requesting an X-ray  Its been going on since September  Pt states its getting better than its coming back  The medicine the nurse provide her doesn't help pt states when she take a deep breathe it hurts  Pt states It comes deep    UPPER RESPIRATORY TRACT INFECTION Has had cough on and off since September.  Initially treated on 06/07/23 with Prednisone.  Then treated with Augmentin on 08/23/23, which she reports did help, but now has come back.  Having back pain with cough, bilateral upper muscle pain.   Fever: no Cough: yes Shortness of breath: yes Wheezing: no Chest pain: no Chest tightness: yes Chest congestion: yes Nasal congestion: no Runny nose: no Post nasal drip: no Sneezing: no Sore throat: no Swollen glands: no Sinus pressure: no Headache: no Face pain: no Toothache: no Ear pain: none Ear pressure: none Eyes red/itching:no Eye drainage/crusting: no  Vomiting: no Rash: no Fatigue: yes, a little, but can work Sick contacts: no Strep contacts: no  Context: fluctuating Recurrent sinusitis: no Relief with OTC cold/cough medications: no  Treatments attempted: nothing    Relevant past medical, surgical, family and social history reviewed and updated as indicated. Interim medical history since our last visit reviewed. Allergies and medications reviewed and updated.  Review of Systems  Constitutional:  Positive for fatigue. Negative for activity change, appetite change, diaphoresis and fever.  Respiratory:   Positive for cough, chest tightness and shortness of breath. Negative for wheezing.   Cardiovascular:  Negative for chest pain, palpitations and leg swelling.  Gastrointestinal: Negative.   Neurological: Negative.   Psychiatric/Behavioral: Negative.      Per HPI unless specifically indicated above     Objective:    BP 114/74 (BP Location: Right Arm, Patient Position: Sitting, Cuff Size: Normal)   Pulse 71   Temp 98.1 F (36.7 C) (Oral)   Ht 5\' 7"  (1.702 m)   Wt 201 lb 3.2 oz (91.3 kg)   LMP  (LMP Unknown)   SpO2 98%   BMI 31.51 kg/m   Wt Readings from Last 3 Encounters:  11/03/23 201 lb 3.2 oz (91.3 kg)  08/23/23 194 lb 3.2 oz (88.1 kg)  06/07/23 182 lb 12.8 oz (82.9 kg)    Physical Exam Vitals and nursing note reviewed.  Constitutional:      General: She is awake. She is not in acute distress.    Appearance: She is well-developed and well-groomed. She is obese. She is not ill-appearing or toxic-appearing.  HENT:     Head: Normocephalic.     Right Ear: Hearing, ear canal and external ear normal. A middle ear effusion is present. Tympanic membrane is not injected or perforated.     Left Ear: Hearing, ear canal and external ear normal. A middle ear effusion is present. Tympanic membrane is not injected or perforated.  Nose: Rhinorrhea present. Rhinorrhea is clear.     Right Sinus: No maxillary sinus tenderness or frontal sinus tenderness.     Left Sinus: No maxillary sinus tenderness or frontal sinus tenderness.     Mouth/Throat:     Mouth: Mucous membranes are moist.     Pharynx: Posterior oropharyngeal erythema (mild) present. No pharyngeal swelling or oropharyngeal exudate.  Eyes:     General: Lids are normal.        Right eye: No discharge.        Left eye: No discharge.     Conjunctiva/sclera: Conjunctivae normal.     Pupils: Pupils are equal, round, and reactive to light.  Neck:     Thyroid: No thyromegaly.     Vascular: No carotid bruit.  Cardiovascular:      Rate and Rhythm: Normal rate and regular rhythm.     Heart sounds: Normal heart sounds. No murmur heard.    No gallop.  Pulmonary:     Effort: Pulmonary effort is normal. No accessory muscle usage or respiratory distress.     Breath sounds: Wheezing and rales present. No decreased breath sounds or rhonchi.     Comments: Scattered expiratory wheezes throughout with rales to lower bases. Abdominal:     General: Bowel sounds are normal.     Palpations: Abdomen is soft. There is no hepatomegaly or splenomegaly.  Musculoskeletal:     Cervical back: Normal range of motion and neck supple.     Right lower leg: No edema.     Left lower leg: No edema.  Lymphadenopathy:     Head:     Right side of head: Submandibular and tonsillar adenopathy present. No submental, preauricular or posterior auricular adenopathy.     Left side of head: Submandibular and tonsillar adenopathy present. No submental, preauricular or posterior auricular adenopathy.     Cervical: No cervical adenopathy.  Skin:    General: Skin is warm and dry.  Neurological:     Mental Status: She is alert and oriented to person, place, and time.  Psychiatric:        Attention and Perception: Attention normal.        Mood and Affect: Mood normal.        Speech: Speech normal.        Behavior: Behavior normal. Behavior is cooperative.        Thought Content: Thought content normal.     Results for orders placed or performed in visit on 08/23/23  T4, free   Collection Time: 08/23/23  1:39 PM  Result Value Ref Range   Free T4 1.36 0.82 - 1.77 ng/dL  CBC with Differential/Platelet   Collection Time: 08/23/23  1:39 PM  Result Value Ref Range   WBC 7.2 3.4 - 10.8 x10E3/uL   RBC 4.60 3.77 - 5.28 x10E6/uL   Hemoglobin 13.2 11.1 - 15.9 g/dL   Hematocrit 16.1 09.6 - 46.6 %   MCV 91 79 - 97 fL   MCH 28.7 26.6 - 33.0 pg   MCHC 31.6 31.5 - 35.7 g/dL   RDW 04.5 40.9 - 81.1 %   Platelets 302 150 - 450 x10E3/uL   Neutrophils 62 Not  Estab. %   Lymphs 29 Not Estab. %   Monocytes 7 Not Estab. %   Eos 1 Not Estab. %   Basos 1 Not Estab. %   Neutrophils Absolute 4.5 1.4 - 7.0 x10E3/uL   Lymphocytes Absolute 2.1 0.7 - 3.1 x10E3/uL   Monocytes  Absolute 0.5 0.1 - 0.9 x10E3/uL   EOS (ABSOLUTE) 0.1 0.0 - 0.4 x10E3/uL   Basophils Absolute 0.0 0.0 - 0.2 x10E3/uL   Immature Granulocytes 0 Not Estab. %   Immature Grans (Abs) 0.0 0.0 - 0.1 x10E3/uL  Comprehensive metabolic panel   Collection Time: 08/23/23  1:39 PM  Result Value Ref Range   Glucose 99 70 - 99 mg/dL   BUN 12 8 - 27 mg/dL   Creatinine, Ser 1.61 0.57 - 1.00 mg/dL   eGFR 69 >09 UE/AVW/0.98   BUN/Creatinine Ratio 13 12 - 28   Sodium 141 134 - 144 mmol/L   Potassium 4.2 3.5 - 5.2 mmol/L   Chloride 105 96 - 106 mmol/L   CO2 21 20 - 29 mmol/L   Calcium 9.4 8.7 - 10.3 mg/dL   Total Protein 6.6 6.0 - 8.5 g/dL   Albumin 4.3 3.9 - 4.9 g/dL   Globulin, Total 2.3 1.5 - 4.5 g/dL   Bilirubin Total <1.1 0.0 - 1.2 mg/dL   Alkaline Phosphatase 129 (H) 44 - 121 IU/L   AST 23 0 - 40 IU/L   ALT 22 0 - 32 IU/L  Lipid Panel w/o Chol/HDL Ratio   Collection Time: 08/23/23  1:39 PM  Result Value Ref Range   Cholesterol, Total 188 100 - 199 mg/dL   Triglycerides 914 (H) 0 - 149 mg/dL   HDL 50 >78 mg/dL   VLDL Cholesterol Cal 39 5 - 40 mg/dL   LDL Chol Calc (NIH) 99 0 - 99 mg/dL  TSH   Collection Time: 08/23/23  1:39 PM  Result Value Ref Range   TSH 2.850 0.450 - 4.500 uIU/mL  VITAMIN D 25 Hydroxy (Vit-D Deficiency, Fractures)   Collection Time: 08/23/23  1:39 PM  Result Value Ref Range   Vit D, 25-Hydroxy 28.7 (L) 30.0 - 100.0 ng/mL  Vitamin B12   Collection Time: 08/23/23  1:39 PM  Result Value Ref Range   Vitamin B-12 828 232 - 1,245 pg/mL      Assessment & Plan:   Problem List Items Addressed This Visit       Other   Subacute cough - Primary   Ongoing with waxing and waning, currently present.  Will obtain imaging.  Due to risk factor will start  Levofloxacin daily for 5 days and Prednisone 40 MG daily for 5 days.  If no improvement with this treatment or any worsening then will obtain CT imaging.  Recommend: - Increased rest - Increasing Fluids - Acetaminophen needed for fever/pain.  - Salt water gargling, chloraseptic spray and throat lozenges  - Mucinex.  - Saline sinus flushes or a neti pot.  - Humidifying the air.         Follow up plan: Return for as scheduled March 3rd -- will recheck lungs.

## 2023-11-03 NOTE — Assessment & Plan Note (Signed)
Ongoing with waxing and waning, currently present.  Will obtain imaging.  Due to risk factor will start Levofloxacin daily for 5 days and Prednisone 40 MG daily for 5 days.  If no improvement with this treatment or any worsening then will obtain CT imaging.  Recommend: - Increased rest - Increasing Fluids - Acetaminophen needed for fever/pain.  - Salt water gargling, chloraseptic spray and throat lozenges  - Mucinex.  - Saline sinus flushes or a neti pot.  - Humidifying the air.

## 2023-11-14 ENCOUNTER — Encounter: Payer: Self-pay | Admitting: Nurse Practitioner

## 2023-11-15 ENCOUNTER — Encounter: Payer: Self-pay | Admitting: Nurse Practitioner

## 2023-11-15 ENCOUNTER — Ambulatory Visit: Payer: BC Managed Care – PPO | Admitting: Nurse Practitioner

## 2023-11-15 VITALS — BP 113/74 | HR 82 | Temp 98.0°F | Ht 67.0 in | Wt 198.8 lb

## 2023-11-15 DIAGNOSIS — R300 Dysuria: Secondary | ICD-10-CM | POA: Diagnosis not present

## 2023-11-15 DIAGNOSIS — N3001 Acute cystitis with hematuria: Secondary | ICD-10-CM | POA: Diagnosis not present

## 2023-11-15 DIAGNOSIS — R052 Subacute cough: Secondary | ICD-10-CM | POA: Diagnosis not present

## 2023-11-15 DIAGNOSIS — C8331 Diffuse large B-cell lymphoma, lymph nodes of head, face, and neck: Secondary | ICD-10-CM | POA: Diagnosis not present

## 2023-11-15 DIAGNOSIS — R8281 Pyuria: Secondary | ICD-10-CM

## 2023-11-15 LAB — URINALYSIS, ROUTINE W REFLEX MICROSCOPIC
Bilirubin, UA: NEGATIVE
Glucose, UA: NEGATIVE
Nitrite, UA: NEGATIVE
Specific Gravity, UA: >1.03 — ABNORMAL HIGH (ref 1.005–1.030)
Urobilinogen, Ur: 1 mg/dL (ref 0.2–1.0)
pH, UA: 6 (ref 5.0–7.5)

## 2023-11-15 LAB — MICROSCOPIC EXAMINATION: WBC, UA: >30 /[HPF] — AB (ref 0–5)

## 2023-11-15 LAB — WET PREP FOR TRICH, YEAST, CLUE
Clue Cell Exam: NEGATIVE
Trichomonas Exam: NEGATIVE
Yeast Exam: NEGATIVE

## 2023-11-15 MED ORDER — ALBUTEROL SULFATE HFA 108 (90 BASE) MCG/ACT IN AERS: 2.0000 | INHALATION_SPRAY | Freq: Four times a day (QID) | RESPIRATORY_TRACT | 0 refills | Status: DC | PRN

## 2023-11-15 MED ORDER — FLUTICASONE-SALMETEROL 100-50 MCG/ACT IN AEPB: 1.0000 | INHALATION_SPRAY | Freq: Two times a day (BID) | RESPIRATORY_TRACT | 2 refills | Status: AC

## 2023-11-15 MED ORDER — AMOXICILLIN-POT CLAVULANATE 875-125 MG PO TABS: 1.0000 | ORAL_TABLET | Freq: Two times a day (BID) | ORAL | 0 refills | Status: DC

## 2023-11-15 NOTE — Patient Instructions (Signed)

## 2023-11-15 NOTE — Assessment & Plan Note (Signed)
 Acute, starting this morning.  UA with multiple + findings, will send for culture.  Start Augmentin BID for UTI, but adjust regimen as needed based on culture results.  Ensure plenty of hydration daily.

## 2023-11-15 NOTE — Assessment & Plan Note (Signed)
 Ongoing with minimal improvement since initial URI in December.  Has underlying lymphoma, but denies B symptoms at present.  Recent chest x-ray showed no acute issues.  Will obtain CT lung to further assess due to risk factors and ongoing cough.  Has had multiple treatments with minimal improvement.  ?some reactive airway presenting.  Trial Advair one puff BID, educated her on this and to rinse mouth well after use, plus Albuterol as needed only -- discussed this with her at length.  Determine next steps after CT returns.

## 2023-11-15 NOTE — Assessment & Plan Note (Signed)
 Chronic, stable.  Continue collaboration with oncology, recent note reviewed.  Dependent on CT results may need to see sooner than scheduled.

## 2023-11-15 NOTE — Progress Notes (Signed)
 BP 113/74 (BP Location: Left Arm, Patient Position: Sitting, Cuff Size: Normal)   Pulse 82   Temp 98 F (36.7 C) (Oral)   Ht 5\' 7"  (1.702 m)   Wt 198 lb 12.8 oz (90.2 kg)   LMP  (LMP Unknown)   SpO2 98%   BMI 31.14 kg/m    Subjective:    Patient ID: Christina [name], female    DOB: [birth year], 63 y.o.   MRN: [id number]  HPI: Christina [name] is a 63 y.o. female  Chief Complaint  Patient presents with   Urinary Tract Infection    Patient said it started this morning. Having pain and burning and Urinate frequently    UPPER RESPIRATORY TRACT INFECTION Has had cough on and off since September.  Initially treated on 06/07/23 with Prednisone with improvement.  Then treated with Augmentin on 08/23/23, which she reports did help, but then returned.  On 11/03/23 did Levofloxacin and Prednisone and cough continues + hoarseness. Has underlying B cell lymphoma which currently not under treatment for.  Recent chest x-ray showed no acute findings. Fever: no Cough: yes Shortness of breath: yes if prolonged talking Wheezing: no Chest pain: no Chest tightness: yes Chest congestion: yes Nasal congestion: no Runny nose: no Post nasal drip: no Sneezing: no Sore throat: no Swollen glands: no Sinus pressure: no Headache: no Face pain: no Toothache: no Ear pain: none Ear pressure: none Eyes red/itching:no Eye drainage/crusting: no  Vomiting: no Rash: no Fatigue: yes Sick contacts: no Strep contacts: no  Context: fluctuating Recurrent sinusitis: no Relief with OTC cold/cough medications: no  Treatments attempted: as above  URINARY SYMPTOMS This morning started with symptoms. Dysuria: burning Urinary frequency: yes Urgency: yes Small volume voids: yes Symptom severity: yes Urinary incontinence: no Foul odor: yes Hematuria: no Abdominal pain: no Back pain: yes Suprapubic pain/pressure: no Flank pain: no Fever:  no Vomiting: no Status: stable Previous urinary tract infection:  yes Recurrent urinary tract infection: no Sexual activity: monogamous History of sexually transmitted disease: no Treatments attempted: increasing fluids    Relevant past medical, surgical, family and social history reviewed and updated as indicated. Interim medical history since our last visit reviewed. Allergies and medications reviewed and updated.  Review of Systems  Constitutional:  Positive for fatigue. Negative for activity change, appetite change, diaphoresis and fever.  Respiratory:  Positive for cough, chest tightness and shortness of breath. Negative for wheezing.   Cardiovascular:  Negative for chest pain, palpitations and leg swelling.  Gastrointestinal: Negative.   Genitourinary:  Positive for decreased urine volume, dysuria, frequency and urgency.  Neurological: Negative.   Psychiatric/Behavioral: Negative.      Per HPI unless specifically indicated above     Objective:    BP 113/74 (BP Location: Left Arm, Patient Position: Sitting, Cuff Size: Normal)   Pulse 82   Temp 98 F (36.7 C) (Oral)   Ht 5\' 7"  (1.702 m)   Wt 198 lb 12.8 oz (90.2 kg)   LMP  (LMP Unknown)   SpO2 98%   BMI 31.14 kg/m   Wt Readings from Last 3 Encounters:  11/15/23 198 lb 12.8 oz (90.2 kg)  11/03/23 201 lb 3.2 oz (91.3 kg)  08/23/23 194 lb 3.2 oz (88.1 kg)    Physical Exam Vitals and nursing note reviewed.  Constitutional:      General: She is awake. She is not in acute distress.    Appearance: She is well-developed and well-groomed. She is obese. She is not ill-appearing  or toxic-appearing.  HENT:     Head: Normocephalic.     Right Ear: Hearing and external ear normal.     Left Ear: Hearing and external ear normal.  Eyes:     General: Lids are normal.        Right eye: No discharge.        Left eye: No discharge.     Conjunctiva/sclera: Conjunctivae normal.     Pupils: Pupils are equal, round, and reactive to light.  Neck:     Thyroid: No thyromegaly.     Vascular: No  carotid bruit.  Cardiovascular:     Rate and Rhythm: Normal rate and regular rhythm.     Heart sounds: Normal heart sounds. No murmur heard.    No gallop.  Pulmonary:     Effort: Pulmonary effort is normal. No accessory muscle usage or respiratory distress.     Breath sounds: Wheezing present. No decreased breath sounds or rales.     Comments: Intermittent expiratory wheezes throughout.  Ongoing cough present, hacking and dry in nature. Abdominal:     General: Bowel sounds are normal. There is no distension.     Palpations: Abdomen is soft.     Tenderness: There is no abdominal tenderness. There is no right CVA tenderness or left CVA tenderness.  Musculoskeletal:     Cervical back: Normal range of motion and neck supple.     Right lower leg: No edema.     Left lower leg: No edema.  Lymphadenopathy:     Head:     Right side of head: No submental, submandibular, tonsillar, preauricular or posterior auricular adenopathy.     Left side of head: No submental, submandibular, tonsillar, preauricular or posterior auricular adenopathy.     Cervical: No cervical adenopathy.  Skin:    General: Skin is warm and dry.  Neurological:     Mental Status: She is alert and oriented to person, place, and time.     Deep Tendon Reflexes: Reflexes are normal and symmetric.     Reflex Scores:      Brachioradialis reflexes are 2+ on the right side and 2+ on the left side.      Patellar reflexes are 2+ on the right side and 2+ on the left side. Psychiatric:        Attention and Perception: Attention normal.        Mood and Affect: Mood normal.        Speech: Speech normal.        Behavior: Behavior normal. Behavior is cooperative.        Thought Content: Thought content normal.     Results for orders placed or performed in visit on 11/15/23  WET PREP FOR TRICH, YEAST, CLUE   Collection Time: 11/15/23  3:08 PM   Specimen: Urine   Urine  Result Value Ref Range   Trichomonas Exam Negative Negative    Yeast Exam Negative Negative   Clue Cell Exam Negative Negative  Microscopic Examination   Collection Time: 11/15/23  3:08 PM   Urine  Result Value Ref Range   WBC, UA >30 (A) 0 - 5 /hpf   RBC, Urine 3-10 (A) 0 - 2 /hpf   Epithelial Cells (non renal) 0-10 0 - 10 /hpf   Mucus, UA Present (A) Not Estab.   Bacteria, UA Moderate (A) None seen/Few  Urinalysis, Routine w reflex microscopic   Collection Time: 11/15/23  3:08 PM  Result Value Ref Range  Specific Gravity, UA >1.030 (H) 1.005 - 1.030   pH, UA 6.0 5.0 - 7.5   Color, UA Yellow Yellow   Appearance Ur Cloudy (A) Clear   Leukocytes,UA 1+ (A) Negative   Protein,UA 2+ (A) Negative/Trace   Glucose, UA Negative Negative   Ketones, UA Trace (A) Negative   RBC, UA 2+ (A) Negative   Bilirubin, UA Negative Negative   Urobilinogen, Ur 1.0 0.2 - 1.0 mg/dL   Nitrite, UA Negative Negative   Microscopic Examination See below:       Assessment & Plan:   Problem List Items Addressed This Visit       Immune and Lymphatic   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  Continue collaboration with oncology, recent note reviewed.  Dependent on CT results may need to see sooner than scheduled.      Relevant Medications   amoxicillin-clavulanate (AUGMENTIN) 875-125 MG tablet   Other Relevant Orders   CT Chest W Contrast     Genitourinary   Acute cystitis with hematuria   Acute, starting this morning.  UA with multiple + findings, will send for culture.  Start Augmentin BID for UTI, but adjust regimen as needed based on culture results.  Ensure plenty of hydration daily.        Relevant Orders   WET PREP FOR TRICH, YEAST, CLUE (Completed)   Urinalysis, Routine w reflex microscopic (Completed)     Other   Subacute cough   Ongoing with minimal improvement since initial URI in December.  Has underlying lymphoma, but denies B symptoms at present.  Recent chest x-ray showed no acute issues.   Will obtain CT lung to further assess due to risk factors and ongoing cough.  Has had multiple treatments with minimal improvement.  ?some reactive airway presenting.  Trial Advair one puff BID, educated her on this and to rinse mouth well after use, plus Albuterol as needed only -- discussed this with her at length.  Determine next steps after CT returns.        Relevant Orders   CT Chest W Contrast   Other Visit Diagnoses       Pyuria       Urine sent for culture.   Relevant Orders   Urine Culture        Follow up plan: Return for as scheduled March 3rd.

## 2023-11-17 ENCOUNTER — Encounter: Payer: Self-pay | Admitting: Nurse Practitioner

## 2023-11-17 LAB — URINE CULTURE

## 2023-11-17 NOTE — Progress Notes (Signed)
 Contacted via [employer] like a bit of a urine infection.  Augmentin should cover it:)

## 2023-11-19 NOTE — Patient Instructions (Signed)

## 2023-11-21 ENCOUNTER — Encounter: Payer: Self-pay | Admitting: Nurse Practitioner

## 2023-11-21 ENCOUNTER — Ambulatory Visit: Payer: BC Managed Care – PPO | Admitting: Nurse Practitioner

## 2023-11-21 VITALS — BP 109/72 | HR 60 | Temp 97.5°F | Ht 67.0 in | Wt 199.2 lb

## 2023-11-21 DIAGNOSIS — E039 Hypothyroidism, unspecified: Secondary | ICD-10-CM | POA: Diagnosis not present

## 2023-11-21 DIAGNOSIS — R052 Subacute cough: Secondary | ICD-10-CM | POA: Diagnosis not present

## 2023-11-21 DIAGNOSIS — C8331 Diffuse large B-cell lymphoma, lymph nodes of head, face, and neck: Secondary | ICD-10-CM | POA: Diagnosis not present

## 2023-11-21 DIAGNOSIS — N951 Menopausal and female climacteric states: Secondary | ICD-10-CM

## 2023-11-21 MED ORDER — GABAPENTIN 100 MG PO CAPS: 100.0000 mg | ORAL_CAPSULE | Freq: Every day | ORAL | 4 refills | Status: AC

## 2023-11-21 NOTE — Progress Notes (Signed)
 BP 109/72 (BP Location: Left Arm, Patient Position: Sitting, Cuff Size: Normal)   Pulse 60   Temp (!) 97.5 F (36.4 C) (Oral)   Ht 5\' 7"  (1.702 m)   Wt 199 lb 3.2 oz (90.4 kg)   LMP  (LMP Unknown)   SpO2 98%   BMI 31.20 kg/m    Subjective:    Patient ID: Christina [name], female    DOB: 03-30-1961, 63 y.o.   MRN: [id number]  HPI: TWANIA BUJAK is a 63 y.o. female  Chief Complaint  Patient presents with   Thyroid Problem   Weight Check   Medication Refill    Patient need a refill on Gabapentin and Patient had an message on a refill but dont know the name   NOTE WRITTEN BY DNP STUDENT.  ASSESSMENT AND PLAN OF CARE REVIEWED WITH STUDENT, AGREE WITH ABOVE FINDINGS AND PLAN.   HYPOTHYROIDISM Thyroid control status:stable Satisfied with current treatment? yes Medication side effects: no Medication compliance: good compliance Etiology of hypothyroidism:  Recent dose adjustment:no Fatigue: no Cold intolerance: no Heat intolerance: no Weight gain: no Weight loss: no Constipation: no Diarrhea/loose stools: no Palpitations: no Lower extremity edema: no Anxiety/depressed mood: no   WEIGHT GAIN Has been out of this since January. Duration:  Previous attempts at weight loss: yes Complications of obesity:  Peak weight:  Weight loss goal:  Weight loss to date:  Requesting obesity pharmacotherapy: no Current weight loss supplements/medications: no Previous weight loss supplements/meds: no Calories:    Relevant past medical, surgical, family and social history reviewed and updated as indicated. Interim medical history since our last visit reviewed. Allergies and medications reviewed and updated.  Review of Systems  Constitutional:  Negative for activity change, appetite change, chills, fatigue and fever.  HENT:  Negative for congestion, sinus pressure and sinus pain.   Respiratory:  Positive for cough. Negative for chest tightness, shortness of breath and wheezing.    Cardiovascular:  Negative for chest pain, palpitations and leg swelling.  Gastrointestinal:  Negative for abdominal pain, constipation, diarrhea, nausea and vomiting.  Endocrine: Negative for cold intolerance and heat intolerance.  Genitourinary:  Negative for difficulty urinating.  Musculoskeletal:  Negative for back pain and joint swelling.  Neurological:  Negative for dizziness, speech difficulty and light-headedness.  Psychiatric/Behavioral:  Negative for agitation and suicidal ideas. The patient is not nervous/anxious.     Per HPI unless specifically indicated above     Objective:    BP 109/72 (BP Location: Left Arm, Patient Position: Sitting, Cuff Size: Normal)   Pulse 60   Temp (!) 97.5 F (36.4 C) (Oral)   Ht 5\' 7"  (1.702 m)   Wt 199 lb 3.2 oz (90.4 kg)   LMP  (LMP Unknown)   SpO2 98%   BMI 31.20 kg/m   Wt Readings from Last 3 Encounters:  11/21/23 199 lb 3.2 oz (90.4 kg)  11/15/23 198 lb 12.8 oz (90.2 kg)  11/03/23 201 lb 3.2 oz (91.3 kg)    Physical Exam Vitals and nursing note reviewed.  Constitutional:      General: She is not in acute distress.    Appearance: Normal appearance. She is well-developed, well-groomed and overweight. She is not ill-appearing.  Neck:     Thyroid: No thyroid mass or thyroid tenderness.     Vascular: No carotid bruit.     Trachea: Trachea normal. No tracheal tenderness.  Cardiovascular:     Rate and Rhythm: Normal rate and regular rhythm.  Heart sounds: Normal heart sounds. No murmur heard. Pulmonary:     Effort: Pulmonary effort is normal.     Breath sounds: Normal breath sounds. No wheezing.  Abdominal:     General: Bowel sounds are normal. There is no distension.     Palpations: Abdomen is soft.     Tenderness: There is no abdominal tenderness.  Musculoskeletal:        General: Normal range of motion.     Cervical back: Full passive range of motion without pain, normal range of motion and neck supple. No tenderness.      Right lower leg: No edema.     Left lower leg: No edema.  Lymphadenopathy:     Cervical: No cervical adenopathy.     Right cervical: No superficial cervical adenopathy.    Left cervical: No superficial cervical adenopathy.  Skin:    General: Skin is warm and dry.     Findings: No bruising or rash.  Neurological:     General: No focal deficit present.     Mental Status: She is alert and oriented to person, place, and time. Mental status is at baseline.     Deep Tendon Reflexes: Reflexes normal.  Psychiatric:        Attention and Perception: Attention and perception normal.        Mood and Affect: Mood and affect normal. Mood is not anxious or depressed.        Speech: Speech normal.        Behavior: Behavior normal. Behavior is cooperative.        Thought Content: Thought content normal.        Cognition and Memory: Cognition and memory normal.        Judgment: Judgment normal.     Results for orders placed or performed in visit on 11/15/23  WET PREP FOR TRICH, YEAST, CLUE   Collection Time: 11/15/23  3:08 PM   Specimen: Urine   Urine  Result Value Ref Range   Trichomonas Exam Negative Negative   Yeast Exam Negative Negative   Clue Cell Exam Negative Negative  Microscopic Examination   Collection Time: 11/15/23  3:08 PM   Urine  Result Value Ref Range   WBC, UA >30 (A) 0 - 5 /hpf   RBC, Urine 3-10 (A) 0 - 2 /hpf   Epithelial Cells (non renal) 0-10 0 - 10 /hpf   Mucus, UA Present (A) Not Estab.   Bacteria, UA Moderate (A) None seen/Few  Urinalysis, Routine w reflex microscopic   Collection Time: 11/15/23  3:08 PM  Result Value Ref Range   Specific Gravity, UA >1.030 (H) 1.005 - 1.030   pH, UA 6.0 5.0 - 7.5   Color, UA Yellow Yellow   Appearance Ur Cloudy (A) Clear   Leukocytes,UA 1+ (A) Negative   Protein,UA 2+ (A) Negative/Trace   Glucose, UA Negative Negative   Ketones, UA Trace (A) Negative   RBC, UA 2+ (A) Negative   Bilirubin, UA Negative Negative    Urobilinogen, Ur 1.0 0.2 - 1.0 mg/dL   Nitrite, UA Negative Negative   Microscopic Examination See below:   Urine Culture   Collection Time: 11/15/23  4:19 PM   Specimen: Urine   UR  Result Value Ref Range   Urine Culture, Routine Final report (A)    Organism ID, Bacteria Escherichia coli (A)    Antimicrobial Susceptibility Comment       Assessment & Plan:   Problem List Items Addressed  This Visit       Cardiovascular and Mediastinum   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 Continue current POC, Gabapentin 100 mg nightly. Will adjust as needed. Refills ordered.         Endocrine   Hypothyroid - Primary   Chronic, stable. Continue current POC and adjust as needed based on labs. Thyroid labs today. Follow up in 3 weeks.      Relevant Orders   T4, free   TSH     Immune and Lymphatic   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HCC)   Chronic, stable. Upcoming CT scan scheduled. Continue current POC. Follow up after CT results.      Relevant Medications   gabapentin (NEURONTIN) 100 MG capsule     Other   Subacute cough   Ongoing. Some improvement with current medication regimen. Await CT results to determine next course of treatment. Continue current POC.         Follow up plan: Return in about 2 weeks (around 12/05/2023) for Cough.

## 2023-11-21 NOTE — Progress Notes (Deleted)
 BP 109/72 (BP Location: Left Arm, Patient Position: Sitting, Cuff Size: Normal)   Pulse 60   Temp (!) 97.5 F (36.4 C) (Oral)   Ht 5\' 7"  (1.702 m)   Wt 199 lb 3.2 oz (90.4 kg)   LMP  (LMP Unknown)   SpO2 98%   BMI 31.20 kg/m    Subjective:    Patient ID: Christina [name], female    DOB: 1961/06/06, 63 y.o.   MRN: [id number]  HPI: Christina [name] is a 63 y.o. female  Chief Complaint  Patient presents with   Thyroid Problem   Weight Check   Medication Refill    Patient need a refill on Gabapentin and Patient had an message on a refill but dont know the name   HYPOTHYROIDISM Thyroid control status:{Blank single:19197::"controlled","uncontrolled","better","worse","exacerbated","stable"} Satisfied with current treatment? {Blank single:19197::"yes","no"} Medication side effects: {Blank single:19197::"yes","no"} Medication compliance: {Blank single:19197::"excellent compliance","good compliance","fair compliance","poor compliance"} Etiology of hypothyroidism:  Recent dose adjustment:{Blank single:19197::"yes","no"} Fatigue: {Blank single:19197::"yes","no"} Cold intolerance: {Blank single:19197::"yes","no"} Heat intolerance: {Blank single:19197::"yes","no"} Weight gain: {Blank single:19197::"yes","no"} Weight loss: {Blank single:19197::"yes","no"} Constipation: {Blank single:19197::"yes","no"} Diarrhea/loose stools: {Blank single:19197::"yes","no"} Palpitations: {Blank single:19197::"yes","no"} Lower extremity edema: {Blank single:19197::"yes","no"} Anxiety/depressed mood: {Blank single:19197::"yes","no"}   WEIGHT GAIN Has been out of this since January. Duration:  Previous attempts at weight loss: {Blank single:19197::"yes","no"} Complications of obesity:  Peak weight:  Weight loss goal:  Weight loss to date:  Requesting obesity pharmacotherapy: {Blank single:19197::"yes","no"} Current weight loss supplements/medications: {Blank single:19197::"yes","no"} Previous weight  loss supplements/meds: {Blank single:19197::"yes","no"} Calories:    Relevant past medical, surgical, family and social history reviewed and updated as indicated. Interim medical history since our last visit reviewed. Allergies and medications reviewed and updated.  Review of Systems  Per HPI unless specifically indicated above     Objective:    BP 109/72 (BP Location: Left Arm, Patient Position: Sitting, Cuff Size: Normal)   Pulse 60   Temp (!) 97.5 F (36.4 C) (Oral)   Ht 5\' 7"  (1.702 m)   Wt 199 lb 3.2 oz (90.4 kg)   LMP  (LMP Unknown)   SpO2 98%   BMI 31.20 kg/m   Wt Readings from Last 3 Encounters:  11/21/23 199 lb 3.2 oz (90.4 kg)  11/15/23 198 lb 12.8 oz (90.2 kg)  11/03/23 201 lb 3.2 oz (91.3 kg)    Physical Exam  Results for orders placed or performed in visit on 11/15/23  WET PREP FOR TRICH, YEAST, CLUE   Collection Time: 11/15/23  3:08 PM   Specimen: Urine   Urine  Result Value Ref Range   Trichomonas Exam Negative Negative   Yeast Exam Negative Negative   Clue Cell Exam Negative Negative  Microscopic Examination   Collection Time: 11/15/23  3:08 PM   Urine  Result Value Ref Range   WBC, UA >30 (A) 0 - 5 /hpf   RBC, Urine 3-10 (A) 0 - 2 /hpf   Epithelial Cells (non renal) 0-10 0 - 10 /hpf   Mucus, UA Present (A) Not Estab.   Bacteria, UA Moderate (A) None seen/Few  Urinalysis, Routine w reflex microscopic   Collection Time: 11/15/23  3:08 PM  Result Value Ref Range   Specific Gravity, UA >1.030 (H) 1.005 - 1.030   pH, UA 6.0 5.0 - 7.5   Color, UA Yellow Yellow   Appearance Ur Cloudy (A) Clear   Leukocytes,UA 1+ (A) Negative   Protein,UA 2+ (A) Negative/Trace   Glucose, UA Negative Negative   Ketones, UA Trace (A) Negative  RBC, UA 2+ (A) Negative   Bilirubin, UA Negative Negative   Urobilinogen, Ur 1.0 0.2 - 1.0 mg/dL   Nitrite, UA Negative Negative   Microscopic Examination See below:   Urine Culture   Collection Time: 11/15/23  4:19 PM    Specimen: Urine   UR  Result Value Ref Range   Urine Culture, Routine Final report (A)    Organism ID, Bacteria Escherichia coli (A)    Antimicrobial Susceptibility Comment       Assessment & Plan:   Problem List Items Addressed This Visit   None    Follow up plan: No follow-ups on file.

## 2023-11-21 NOTE — Assessment & Plan Note (Signed)
 Chronic, stable. Continue current POC and adjust as needed based on labs. Thyroid labs today. Follow up in 3 weeks.

## 2023-11-21 NOTE — Assessment & Plan Note (Signed)
 Ongoing. Some improvement with current medication regimen. Await CT results to determine next course of treatment. Continue current POC.

## 2023-11-21 NOTE — Assessment & Plan Note (Signed)
 Chronic, stable. Upcoming CT scan scheduled. Continue current POC. Follow up after CT results.

## 2023-11-21 NOTE — Assessment & Plan Note (Signed)
 BMI 31.20, refill sent to [facility] for Semaglutide compound. Discussed in length more frequent, smaller meals with higher protein and low fat meals. Recommended meal prepping due to work schedule. Reports she has a source and awaiting information on how to get started. Patient voiced motivation to restart medication and learning more about meal prepping.

## 2023-11-21 NOTE — Assessment & Plan Note (Signed)
 Chronic, stable. Continue current POC, Gabapentin 100 mg nightly. Will adjust as needed. Refills ordered.

## 2023-11-22 ENCOUNTER — Encounter: Payer: Self-pay | Admitting: Nurse Practitioner

## 2023-11-22 ENCOUNTER — Ambulatory Visit: Payer: BC Managed Care – PPO | Admitting: Nurse Practitioner

## 2023-11-22 ENCOUNTER — Other Ambulatory Visit: Payer: BC Managed Care – PPO

## 2023-11-22 LAB — T4, FREE: Free T4: 1.34 ng/dL (ref 0.82–1.77)

## 2023-11-22 LAB — TSH: TSH: 1.56 u[IU]/mL (ref 0.450–4.500)

## 2023-11-22 NOTE — Progress Notes (Signed)
 Contacted via MyChart   Good morning Christina [name], your thyroid labs remain in normal range.  Continue current medication.  Any questions? Keep being amazing!!  Thank you for allowing me to participate in your care.  I appreciate you. Kindest regards, Klaudia Beirne

## 2023-11-23 ENCOUNTER — Ambulatory Visit
Admission: RE | Admit: 2023-11-23 | Discharge: 2023-11-23 | Disposition: A | Discharge status: Home / Self Care | Payer: BC Managed Care – PPO | Source: Ambulatory Visit | Attending: Nurse Practitioner | Admitting: Nurse Practitioner

## 2023-11-23 DIAGNOSIS — R052 Subacute cough: Secondary | ICD-10-CM | POA: Diagnosis not present

## 2023-11-23 DIAGNOSIS — C8331 Diffuse large B-cell lymphoma, lymph nodes of head, face, and neck: Secondary | ICD-10-CM

## 2023-11-23 DIAGNOSIS — R053 Chronic cough: Secondary | ICD-10-CM | POA: Diagnosis not present

## 2023-11-23 DIAGNOSIS — R918 Other nonspecific abnormal finding of lung field: Secondary | ICD-10-CM | POA: Diagnosis not present

## 2023-11-23 MED ORDER — IOHEXOL 300 MG/ML  SOLN
75.0000 mL | Freq: Once | INTRAMUSCULAR | Status: AC | PRN
  Administered 2023-11-23: 75 mL via INTRAVENOUS

## 2023-12-06 ENCOUNTER — Ambulatory Visit: Admitting: Nurse Practitioner

## 2023-12-07 ENCOUNTER — Other Ambulatory Visit: Payer: Self-pay | Admitting: Nurse Practitioner

## 2023-12-08 ENCOUNTER — Encounter: Payer: Self-pay | Admitting: Nurse Practitioner

## 2023-12-08 ENCOUNTER — Other Ambulatory Visit: Payer: Self-pay

## 2023-12-08 ENCOUNTER — Encounter: Payer: Self-pay | Admitting: Oncology

## 2023-12-08 DIAGNOSIS — Z08 Encounter for follow-up examination after completed treatment for malignant neoplasm: Secondary | ICD-10-CM

## 2023-12-08 NOTE — Progress Notes (Signed)
 Contacted via MyChart   Good afternoon Christina [name], your CT returned and there is area showing infiltrates, this could mean pneumonia or could be other causes for this (infiltrate means there is a more dense area). There is a small nodule noted too.  They recommend repeat in 3-6 months.  How is your cough?  I am concerned with doing another antibiotic because we have done so many.  I know you see oncology tomorrow, alert them you recently had CT.  Let me know how to cough is.  Any questions? Keep being amazing!!  Thank you for allowing me to participate in your care.  I appreciate you. Kindest regards, Kyliah Deanda

## 2023-12-08 NOTE — Telephone Encounter (Signed)
 Requested Prescriptions  Pending Prescriptions Disp Refills   albuterol (VENTOLIN HFA) 108 (90 Base) MCG/ACT inhaler [Pharmacy Med Name: ALBUTEROL HFA (PROVENTIL) INH] 6.7 each 0    Sig: TAKE 2 PUFFS BY MOUTH EVERY 6 HOURS AS NEEDED FOR WHEEZE OR SHORTNESS OF BREATH     Pulmonology:  [facility] 2 Passed - 12/08/2023  9:57 AM      Passed - Last BP in normal range    BP Readings from Last 1 Encounters:  11/21/23 109/72         Passed - Last Heart Rate in normal range    Pulse Readings from Last 1 Encounters:  11/21/23 60         Passed - Valid encounter within last 12 months    Recent Outpatient Visits           3 months ago 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HCC)   [place] [facility] [place], [place] T, NP   6 months ago Bronchitis   [place] [facility] Mecum, Erin E, PA-C   9 months ago 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HCC)   [place] [facility] [place], Corrie Dandy T, NP   1 year ago Hypothyroidism, unspecified type   [place] [facility] [place], [place] T, NP   1 year ago 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HCC)   [place] [facility] [place], Dorie Rank, NP

## 2023-12-09 ENCOUNTER — Inpatient Hospital Stay: Payer: BC Managed Care – PPO | Attending: Oncology

## 2023-12-09 ENCOUNTER — Other Ambulatory Visit: Payer: Self-pay

## 2023-12-09 ENCOUNTER — Encounter: Payer: Self-pay | Admitting: Oncology

## 2023-12-09 ENCOUNTER — Inpatient Hospital Stay (HOSPITAL_BASED_OUTPATIENT_CLINIC_OR_DEPARTMENT_OTHER): Payer: BC Managed Care – PPO | Admitting: Oncology

## 2023-12-09 VITALS — BP 129/90 | HR 83 | Temp 96.8°F | Resp 18 | Ht 66.0 in | Wt 198.0 lb

## 2023-12-09 DIAGNOSIS — Z9049 Acquired absence of other specified parts of digestive tract: Secondary | ICD-10-CM | POA: Insufficient documentation

## 2023-12-09 DIAGNOSIS — Z8042 Family history of malignant neoplasm of prostate: Secondary | ICD-10-CM | POA: Diagnosis not present

## 2023-12-09 DIAGNOSIS — Z90710 Acquired absence of both cervix and uterus: Secondary | ICD-10-CM | POA: Diagnosis not present

## 2023-12-09 DIAGNOSIS — R918 Other nonspecific abnormal finding of lung field: Secondary | ICD-10-CM | POA: Diagnosis not present

## 2023-12-09 DIAGNOSIS — Z79890 Hormone replacement therapy (postmenopausal): Secondary | ICD-10-CM | POA: Diagnosis not present

## 2023-12-09 DIAGNOSIS — Z803 Family history of malignant neoplasm of breast: Secondary | ICD-10-CM | POA: Insufficient documentation

## 2023-12-09 DIAGNOSIS — Z8572 Personal history of non-Hodgkin lymphomas: Secondary | ICD-10-CM | POA: Insufficient documentation

## 2023-12-09 DIAGNOSIS — Z8579 Personal history of other malignant neoplasms of lymphoid, hematopoietic and related tissues: Secondary | ICD-10-CM

## 2023-12-09 DIAGNOSIS — Z79899 Other long term (current) drug therapy: Secondary | ICD-10-CM | POA: Insufficient documentation

## 2023-12-09 DIAGNOSIS — R053 Chronic cough: Secondary | ICD-10-CM | POA: Insufficient documentation

## 2023-12-09 DIAGNOSIS — Z9221 Personal history of antineoplastic chemotherapy: Secondary | ICD-10-CM | POA: Insufficient documentation

## 2023-12-09 DIAGNOSIS — E039 Hypothyroidism, unspecified: Secondary | ICD-10-CM | POA: Insufficient documentation

## 2023-12-09 DIAGNOSIS — Z801 Family history of malignant neoplasm of trachea, bronchus and lung: Secondary | ICD-10-CM | POA: Insufficient documentation

## 2023-12-09 DIAGNOSIS — Z08 Encounter for follow-up examination after completed treatment for malignant neoplasm: Secondary | ICD-10-CM | POA: Diagnosis not present

## 2023-12-09 DIAGNOSIS — Z8249 Family history of ischemic heart disease and other diseases of the circulatory system: Secondary | ICD-10-CM | POA: Insufficient documentation

## 2023-12-09 LAB — CBC WITH DIFFERENTIAL (CANCER CENTER ONLY)
Abs Immature Granulocytes: 0.05 10*3/uL (ref 0.00–0.07)
Basophils Absolute: 0.1 10*3/uL (ref 0.0–0.1)
Basophils Relative: 1 %
Eosinophils Absolute: 0.1 10*3/uL (ref 0.0–0.5)
Eosinophils Relative: 1 %
HCT: 42.3 % (ref 36.0–46.0)
Hemoglobin: 13.8 g/dL (ref 12.0–15.0)
Immature Granulocytes: 1 %
Lymphocytes Relative: 26 %
Lymphs Abs: 2.2 10*3/uL (ref 0.7–4.0)
MCH: 29 pg (ref 26.0–34.0)
MCHC: 32.6 g/dL (ref 30.0–36.0)
MCV: 88.9 fL (ref 80.0–100.0)
Monocytes Absolute: 0.7 10*3/uL (ref 0.1–1.0)
Monocytes Relative: 8 %
Neutro Abs: 5.6 10*3/uL (ref 1.7–7.7)
Neutrophils Relative %: 63 %
Platelet Count: 313 10*3/uL (ref 150–400)
RBC: 4.76 MIL/uL (ref 3.87–5.11)
RDW: 13.3 % (ref 11.5–15.5)
WBC Count: 8.7 10*3/uL (ref 4.0–10.5)
nRBC: 0 % (ref 0.0–0.2)

## 2023-12-09 LAB — CMP (CANCER CENTER ONLY)
ALT: 23 U/L (ref 0–44)
AST: 19 U/L (ref 15–41)
Albumin: 4 g/dL (ref 3.5–5.0)
Alkaline Phosphatase: 102 U/L (ref 38–126)
Anion gap: 9 (ref 5–15)
BUN: 17 mg/dL (ref 8–23)
CO2: 24 mmol/L (ref 22–32)
Calcium: 9.3 mg/dL (ref 8.9–10.3)
Chloride: 105 mmol/L (ref 98–111)
Creatinine: 0.99 mg/dL (ref 0.44–1.00)
GFR, Estimated: >60 mL/min (ref >60)
Glucose, Bld: 102 mg/dL — ABNORMAL HIGH (ref 70–99)
Potassium: 4.2 mmol/L (ref 3.5–5.1)
Sodium: 138 mmol/L (ref 135–145)
Total Bilirubin: 0.5 mg/dL (ref 0.0–1.2)
Total Protein: 7.3 g/dL (ref 6.5–8.1)

## 2023-12-09 LAB — LACTATE DEHYDROGENASE: LDH: 137 U/L (ref 98–192)

## 2023-12-09 MED ORDER — PANTOPRAZOLE SODIUM 20 MG PO TBEC: 20.0000 mg | DELAYED_RELEASE_TABLET | Freq: Every day | ORAL | 0 refills | Status: AC

## 2023-12-09 NOTE — Progress Notes (Signed)
 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 Patient is a 63 year old female who presented to her primary care doctor with symptoms of right neck swelling in January 2018. Her symptoms of an ongoing since December 2017. Patient works at a Scientist, product/process development (medial in the swelling but the swelling did not go away. She was then referred to ENT.    2. CT of the soft tissue neck with contrast showed malignant right leg lymphadenopathy with heterogeneous enhancement and extracapsular extension occupying both the right level II and level III nodal stations. Individually B abnormal lymph nodes measure up to 4.2 cm in largest dimension and the conglomerulation of abnormal lymph nodes is 5-5.5 cm across. There is a small but asymmetric and conspicuity 7 mm lymph node along the lower right 3-B station. Mass effect on the right carotid space from the abnormal nodes but the major vascular structures in the neck at the skull base including the right IJ remained patent.   3. 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 Cells were CD20 positive and BCL 6 and partially dim BCL-2 positive. B cells were negative for C myc (10%). Mom one was positive in 50% of the cells. Ki-67 was 50%.Bone marrow biopsy was negative for lymphoma involvement   4.  PET/CT showed IMPRESSION: 1. FDG avid malignancy  is identified in the level 2 and 3 nodal stations of the right cervical lymph node chain, consistent with the patient's known lymphoma. 2. Focal uptake in the gastric cardia is suspicious for malignancy as well. Recommend direct visualization and biopsy as clinically Warranted.   5. EGD showed diffuse moderately erythematous mucosa without bleeding initial nonbleeding gastric ulcer 6 mm which was biopsied. Findings were consistent with H. pylori gastritis and patient was started on antibiotic therapy for the same. Repeat H pylori testing negative   6. EUS also showed H pylori. There were few B cells noted in lamina propria and clonality studies have been ordered. Initial testing for B cell clonality showed no monoclonal B-cell population. Confirmation testing also did not reveal clonal B cells   7. PET/CT scan after cycles of RCHOP showed: IMPRESSION: 1. Complete metabolic response.  No metabolically active lymphoma. 2. Uniform low-level marrow hypermetabolism throughout the axial skeleton, compatible with reactive marrow state due to chemotherapy. 3. No residual gastric hypermetabolism   8. She completed 3 cycles of RCHOP between feb 2018-aoril 2018 followed by IFRT to her neck in June 2018.  She has been in CR 1 since then      Interval history-patient has been all having intermittent cough with mucoid expectoration since September 2024.  She has tried antibiotics as well as inhalers as well as gabapentin but states that nothing is working for her.  Appetite and weight have remained stable she denies any shortness of breath  ECOG PS- 0 Pain scale- 0   Review of systems- Review of Systems  Constitutional:  Negative for chills, fever, malaise/fatigue and weight loss.  HENT:  Negative for congestion, ear discharge and nosebleeds.   Eyes:  Negative for blurred vision.  Respiratory:  Negative for cough, hemoptysis, sputum production, shortness of breath and wheezing.   Cardiovascular:   Negative for chest pain, palpitations, orthopnea and claudication.  Gastrointestinal:  Negative for abdominal pain, blood in stool, constipation, diarrhea, heartburn, melena, nausea and vomiting.  Genitourinary:  Negative for dysuria, flank pain, frequency, hematuria and urgency.  Musculoskeletal:  Negative for back pain, joint pain and myalgias.  Skin:  Negative for rash.  Neurological:  Negative for dizziness, tingling, focal weakness, seizures, weakness and headaches.  Endo/Heme/Allergies:  Does not bruise/bleed easily.  Psychiatric/Behavioral:  Negative for depression and suicidal ideas. The patient does not have insomnia.       No Known Allergies   Past Medical History:  Diagnosis Date   B-cell lymphoma (HCC)    Cancer (HCC) 2018   lymphoma, non hodgekins B cell   Chest pain 12/08/2016   Chest pain 12/08/2016   Dehydration 12/08/2016   GERD (gastroesophageal reflux disease)    Hypothyroidism      Past Surgical History:  Procedure Laterality Date   CESAREAN SECTION  1985, 1987   x 2    CHOLECYSTECTOMY N/A 10/04/2019   Procedure: LAPAROSCOPIC CHOLECYSTECTOMY;  Surgeon: Henrene Dodge, MD;  Location: [facility] ORS;  Service: General;  Laterality: N/A;   COLONOSCOPY WITH PROPOFOL N/A 09/28/2017   Procedure: COLONOSCOPY WITH PROPOFOL;  Surgeon: Wyline Mood, MD;  Location: [facility] ENDOSCOPY;  Service: Gastroenterology;  Laterality: N/A;   ESOPHAGOGASTRODUODENOSCOPY (EGD) WITH PROPOFOL N/A 11/11/2016   Procedure: ESOPHAGOGASTRODUODENOSCOPY (EGD) WITH PROPOFOL;  Surgeon: Wyline Mood, MD;  Location: [facility] ENDOSCOPY;  Service: Endoscopy;  Laterality: N/A;   IR GENERIC HISTORICAL  11/03/2016   IR FLUORO GUIDE PORT INSERTION RIGHT 11/03/2016 Irish Lack, MD [facility]-INTERV RAD   IR REMOVAL TUN ACCESS W/ PORT W/O FL MOD SED  02/15/2017   REFRACTIVE SURGERY  09/2015   TOTAL ABDOMINAL HYSTERECTOMY      Social History   Socioeconomic History   Marital status: Single    Spouse name: Not on file    Number of children: Not on file   Years of education: Not on file   Highest education level: Not on file  Occupational History   Not on file  Tobacco Use   Smoking status: Never   Smokeless tobacco: Never  Vaping Use   Vaping status: Never Used  Substance and Sexual Activity   Alcohol use: No   Drug use: No   Sexual activity: Yes  Other Topics Concern   Not on file  Social History Narrative   Not on file   Social Drivers of Health   Financial Resource Strain: Low Risk  (08/23/2023)   Overall Financial Resource Strain (CARDIA)    Difficulty of Paying Living Expenses: Not hard at all  Food Insecurity: No Food Insecurity (08/23/2023)   Hunger Vital Sign    Worried About Running Out of Food in the Last Year: Never true    Ran Out of Food in the Last Year: Never true  Transportation Needs: No Transportation Needs (08/23/2023)   PRAPARE - Administrator, Civil Service (Medical): No    Lack of Transportation (Non-Medical): No  Physical Activity: Inactive (08/23/2023)   Exercise Vital Sign    Days of Exercise per Week: 0 days    Minutes of Exercise per Session: 0 min  Stress: Stress  Concern Present (08/23/2023)   [employer] of Occupational Health - Occupational Stress Questionnaire    Feeling of Stress : To some extent  Social Connections: Socially Integrated (08/23/2023)   Social Connection and Isolation Panel [NHANES]    Frequency of Communication with Friends and Family: More than three times a week    Frequency of Social Gatherings with Friends and Family: Once a week    Attends Religious Services: More than 4 times per year    Active Member of [employer] or Organizations: Yes    Attends Engineer, structural: More than 4 times per year    Marital Status: Married  Catering manager Violence: Not At Risk (08/23/2023)   Humiliation, Afraid, Rape, and Kick questionnaire    Fear of Current or Ex-Partner: No    Emotionally Abused: No    Physically Abused: No     Sexually Abused: No    Family History  Problem Relation Age of Onset   Breast cancer Mother 52   Cancer Mother        breast   Cancer Father        lung   Heart disease Brother 33       MI   Prostate cancer Brother    Heart disease Paternal Grandfather        MI     Current Outpatient Medications:    albuterol (VENTOLIN HFA) 108 (90 Base) MCG/ACT inhaler, TAKE 2 PUFFS BY MOUTH EVERY 6 HOURS AS NEEDED FOR WHEEZE OR SHORTNESS OF BREATH, Disp: 6.7 each, Rfl: 0   cyanocobalamin (VITAMIN B12) 1000 MCG/ML injection, INJECT 1 ML (1,000 MCG TOTAL) INTO THE MUSCLE EVERY 30 DAYS., Disp: 30 mL, Rfl: 0   fluticasone-salmeterol (ADVAIR) 100-50 MCG/ACT AEPB, Inhale 1 puff into the lungs 2 (two) times daily., Disp: 60 each, Rfl: 2   gabapentin (NEURONTIN) 100 MG capsule, Take 1 capsule (100 mg total) by mouth at bedtime., Disp: 90 capsule, Rfl: 4   levothyroxine (SYNTHROID) 75 MCG tablet, TAKE 1 TABLET BY MOUTH EVERY DAY, Disp: 90 tablet, Rfl: 2   Semaglutide-Weight Management (WEGOVY) 0.25 MG/0.5ML SOAJ, Inject 0.25 mg into the skin once a week., Disp: , Rfl:    SYRINGE-NEEDLE, DISP, 3 ML (B-D 3CC LUER-LOK SYR 23GX1-1/2) 23G X 1-1/2" 3 ML MISC, TO USE FOR MONTHLY B 12 INJECTIONS., Disp: 50 each, Rfl: 1  Physical exam:  Vitals:   12/09/23 1317  BP: (!) 129/90  Pulse: 83  Resp: 18  Temp: (!) 96.8 F (36 C)  TempSrc: Tympanic  SpO2: 98%  Weight: 198 lb (89.8 kg)  Height: 5\' 6"  (1.676 m)   Physical Exam Cardiovascular:     Rate and Rhythm: Normal rate and regular rhythm.     Heart sounds: Normal heart sounds.  Pulmonary:     Effort: Pulmonary effort is normal.     Breath sounds: Normal breath sounds.  Lymphadenopathy:     Comments: No palpable cervical, supraclavicular, axillary or inguinal adenopathy    Skin:    General: Skin is warm and dry.  Neurological:     Mental Status: She is alert and oriented to person, place, and time.         Latest Ref Rng & Units 08/23/2023     1:39 PM  CMP  Glucose 70 - 99 mg/dL 99   BUN 8 - 27 mg/dL 12   Creatinine 1.61 - 1.00 mg/dL 0.96   Sodium 045 - 409 mmol/L 141   Potassium 3.5 - 5.2  mmol/L 4.2   Chloride 96 - 106 mmol/L 105   CO2 20 - 29 mmol/L 21   Calcium 8.7 - 10.3 mg/dL 9.4   Total Protein 6.0 - 8.5 g/dL 6.6   Total Bilirubin 0.0 - 1.2 mg/dL <0.4   Alkaline Phos 44 - 121 IU/L 129   AST 0 - 40 IU/L 23   ALT 0 - 32 IU/L 22       Latest Ref Rng & Units 12/09/2023   12:56 PM  CBC  WBC 4.0 - 10.5 K/uL 8.7   Hemoglobin 12.0 - 15.0 g/dL 54.0   Hematocrit 98.1 - 46.0 % 42.3   Platelets 150 - 400 K/uL 313     No images are attached to the encounter.  CT Chest W Contrast Result Date: 12/07/2023 CLINICAL DATA:  Chronic cough persistent over 8 weeks EXAM: CT CHEST WITH CONTRAST TECHNIQUE: Multidetector CT imaging of the chest was performed during intravenous contrast administration. RADIATION DOSE REDUCTION: This exam was performed according to the departmental dose-optimization program which includes automated exposure control, adjustment of the mA and/or kV according to patient size and/or use of iterative reconstruction technique. CONTRAST:  75mL OMNIPAQUE IOHEXOL 300 MG/ML  SOLN COMPARISON:  Chest x-ray November 03, 2023 FINDINGS: Cardiovascular: No significant vascular findings. Normal heart size. No pericardial effusion. No significant coronary artery calcifications. No pericardial effusions. Ascending aorta unremarkable. Mediastinum/Nodes: No enlarged mediastinal, hilar, or axillary lymph nodes. Thyroid gland, trachea, and esophagus demonstrate no significant findings. Lungs/Pleura: Subtle ill-defined of parenchymal changes of the posterior segment of the right lower lobe image 84 measuring about less than 1 cm in diameter could correlate with some infiltrates with minimal nodularity. Possible associated tiny nodule medial to this infiltrate measures 3 mm. Image 85. No other findings in the lungs. Bronchi and trachea  are unremarkable. No other pulmonary nodules. Upper Abdomen: No acute abnormality.  Prior cholecystectomy. Musculoskeletal: No chest wall abnormality. No acute or significant osseous findings. IMPRESSION: *Subtle ill-defined parenchymal changes of the posterior segment of the right lower lobe could correlate with some infiltrates with minimal nodularity. Possible associated tiny nodule medial to this infiltrate measures 3 mm. Three to six-month follow-up CT recommended *No other findings in the lungs. *No enlarged mediastinal, hilar, or axillary lymph nodes. *Prior cholecystectomy. Electronically Signed   By: Shaaron Adler M.D.   On: 12/07/2023 20:42     Assessment and plan- Patient is a 63 y.o. 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  Clinically She is doing well with no concerning signs and symptoms of recurrence based on today's exam.  Patient underwent a CT chest 2 weeks ago for symptoms of persistent cough and found to have parenchymal changes and possible tiny nodule next to the infiltrate.  I suspect that these findings are inflammatory and overall risk of lymphoma or lung cancer is low.  There were no enlarged mediastinal or hilar lymph nodes noted.  Recommend a repeat CT chest be done in about 4 months time which she would like me to coordinate at this time.  Chronic cough: Giving her a prescription for Protonix 20 mg daily for a month to see if there is any component of acid reflux that is triggering her cough.  If there is no improvement in her symptoms after 1  month she should stop taking her Protonix at discuss seeing pulmonary with her primary care provider.  However if her cough does improve with Protonix she will need to discuss this with her primary care provider [facility]  as well if she should continue on it long-term.  I will see her back in 1 year but call her with the results of CT chest. Visit Diagnosis 1. Encounter for follow-up surveillance of diffuse large B-cell lymphoma   2. Lung nodules      Dr. Owens Shark, MD, MPH [facility] at [facility] [phone] 12/09/2023 1:13 PM

## 2023-12-10 ENCOUNTER — Encounter: Payer: Self-pay | Admitting: Internal Medicine

## 2024-01-01 ENCOUNTER — Other Ambulatory Visit: Payer: Self-pay | Admitting: Oncology

## 2024-01-01 ENCOUNTER — Other Ambulatory Visit: Payer: Self-pay | Admitting: Nurse Practitioner

## 2024-01-03 NOTE — Telephone Encounter (Signed)
 Requested Prescriptions  Pending Prescriptions Disp Refills   albuterol (VENTOLIN HFA) 108 (90 Base) MCG/ACT inhaler [Pharmacy Med Name: ALBUTEROL HFA (PROVENTIL) INH] 6.7 each 0    Sig: TAKE 2 PUFFS BY MOUTH EVERY 6 HOURS AS NEEDED FOR WHEEZE OR SHORTNESS OF BREATH     Pulmonology:  Beta Agonists 2 Failed - 01/03/2024  7:41 AM      Failed - Last BP in normal range    BP Readings from Last 1 Encounters:  12/09/23 (!) 129/90         Passed - Last Heart Rate in normal range    Pulse Readings from Last 1 Encounters:  12/09/23 83         Passed - Valid encounter within last 12 months    Recent Outpatient Visits           1 month ago Hypothyroidism, unspecified type   [place] [facility] [place], Dick[place] T, NP   1 month ago 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HCC)   [place] [facility] [place], Sanjuan Crumbly T, NP   2 months ago Subacute cough   [place] [facility] [place], Lavelle Posey, NP

## 2024-01-06 ENCOUNTER — Encounter: Payer: Self-pay | Admitting: Internal Medicine

## 2024-01-06 NOTE — Telephone Encounter (Signed)
 Yes please have her reach out to her pcp if she needs it long term

## 2024-01-17 ENCOUNTER — Encounter: Payer: Self-pay | Admitting: Nurse Practitioner

## 2024-01-18 MED ORDER — ONDANSETRON HCL 4 MG PO TABS: 4.0000 mg | ORAL_TABLET | Freq: Three times a day (TID) | ORAL | 0 refills | Status: AC | PRN

## 2024-01-23 ENCOUNTER — Encounter: Payer: Self-pay | Admitting: Nurse Practitioner

## 2024-01-24 MED ORDER — WEGOVY 0.5 MG/0.5ML ~~LOC~~ SOAJ: 0.5000 mg | SUBCUTANEOUS | 1 refills | Status: AC

## 2024-01-24 MED ORDER — WEGOVY 0.25 MG/0.5ML ~~LOC~~ SOAJ: 0.2500 mg | SUBCUTANEOUS | 1 refills | Status: AC

## 2024-01-28 ENCOUNTER — Other Ambulatory Visit: Payer: Self-pay | Admitting: Nurse Practitioner

## 2024-01-30 NOTE — Telephone Encounter (Signed)
 Requested Prescriptions  Pending Prescriptions Disp Refills   levothyroxine  (SYNTHROID ) 75 MCG tablet [Pharmacy Med Name: LEVOTHYROXINE  75 MCG TABLET] 90 tablet 2    Sig: TAKE 1 TABLET BY MOUTH EVERY DAY     Endocrinology:  Hypothyroid Agents Passed - 01/30/2024  5:27 PM      Passed - TSH in normal range and within 360 days    TSH  Date Value Ref Range Status  11/21/2023 1.560 0.450 - 4.500 uIU/mL Final         Passed - Valid encounter within last 12 months    Recent Outpatient Visits           2 months ago Hypothyroidism, unspecified type   [place] [facility] [place], [place] T, NP   2 months ago 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HCC)   [place] [facility] [place], Sanjuan Crumbly T, NP   2 months ago Subacute cough   [place] [facility] [place], Lavelle Posey, NP

## 2024-01-31 ENCOUNTER — Ambulatory Visit: Admitting: Nurse Practitioner

## 2024-01-31 ENCOUNTER — Encounter: Payer: Self-pay | Admitting: Nurse Practitioner

## 2024-01-31 NOTE — Progress Notes (Deleted)
 LMP  (LMP Unknown)    Subjective:    Patient ID: Christina [name], female    DOB: Apr 26, 1961, 63 y.o.   MRN: [id number]  HPI: Christina [name] is a 63 y.o. female  No chief complaint on file.  URINARY SYMPTOMS  Dysuria: {Blank single:19197::"yes","no","burning"} Urinary frequency: {Blank single:19197::"yes","no"} Urgency: {Blank single:19197::"yes","no"} Small volume voids: {Blank single:19197::"yes","no"} Symptom severity: {Blank single:19197::"yes","no"} Urinary incontinence: {Blank single:19197::"yes","no"} Foul odor: {Blank single:19197::"yes","no"} Hematuria: {Blank single:19197::"yes","no"} Abdominal pain: {Blank single:19197::"yes","no"} Back pain: {Blank single:19197::"yes","no"} Suprapubic pain/pressure: {Blank single:19197::"yes","no"} Flank pain: {Blank single:19197::"yes","no"} Fever:  {Blank multiple:19196::"yes","no","subjective","low grade"} Vomiting: {Blank single:19197::"yes","no"} Relief with cranberry juice: {Blank single:19197::"yes","no"} Relief with pyridium: {Blank single:19197::"yes","no"} Status: better/worse/stable Previous urinary tract infection: {Blank single:19197::"yes","no"} Recurrent urinary tract infection: {Blank single:19197::"yes","no"} Sexual activity: No sexually active/monogomous/practicing safe sex History of sexually transmitted disease: {Blank single:19197::"yes","no"} Penile discharge: {Blank single:19197::"yes","no"} Treatments attempted: {Blank multiple:19196::"none","antibiotics","pyridium","cranberry","increasing fluids"}   Relevant past medical, surgical, family and social history reviewed and updated as indicated. Interim medical history since our last visit reviewed. Allergies and medications reviewed and updated.  Review of Systems  Per HPI unless specifically indicated above     Objective:     LMP  (LMP Unknown)   Wt Readings from Last 3 Encounters:  12/09/23 198 lb (89.8 kg)  11/21/23 199 lb 3.2 oz (90.4 kg)   11/15/23 198 lb 12.8 oz (90.2 kg)    Physical Exam  Results for orders placed or performed in visit on 12/09/23  Lactate dehydrogenase   Collection Time: 12/09/23 12:56 PM  Result Value Ref Range   LDH 137 98 - 192 U/L  CBC with Differential (Cancer Center Only)   Collection Time: 12/09/23 12:56 PM  Result Value Ref Range   WBC Count 8.7 4.0 - 10.5 K/uL   RBC 4.76 3.87 - 5.11 MIL/uL   Hemoglobin 13.8 12.0 - 15.0 g/dL   HCT 78.2 95.6 - 21.3 %   MCV 88.9 80.0 - 100.0 fL   MCH 29.0 26.0 - 34.0 pg   MCHC 32.6 30.0 - 36.0 g/dL   RDW 08.6 57.8 - 46.9 %   Platelet Count 313 150 - 400 K/uL   nRBC 0.0 0.0 - 0.2 %   Neutrophils Relative % 63 %   Neutro Abs 5.6 1.7 - 7.7 K/uL   Lymphocytes Relative 26 %   Lymphs Abs 2.2 0.7 - 4.0 K/uL   Monocytes Relative 8 %   Monocytes Absolute 0.7 0.1 - 1.0 K/uL   Eosinophils Relative 1 %   Eosinophils Absolute 0.1 0.0 - 0.5 K/uL   Basophils Relative 1 %   Basophils Absolute 0.1 0.0 - 0.1 K/uL   Immature Granulocytes 1 %   Abs Immature Granulocytes 0.05 0.00 - 0.07 K/uL  CMP (Cancer Center only)   Collection Time: 12/09/23 12:56 PM  Result Value Ref Range   Sodium 138 135 - 145 mmol/L   Potassium 4.2 3.5 - 5.1 mmol/L   Chloride 105 98 - 111 mmol/L   CO2 24 22 - 32 mmol/L   Glucose, Bld 102 (H) 70 - 99 mg/dL   BUN 17 8 - 23 mg/dL   Creatinine 6.29 5.28 - 1.00 mg/dL   Calcium 9.3 8.9 - 41.3 mg/dL   Total Protein 7.3 6.5 - 8.1 g/dL   Albumin 4.0 3.5 - 5.0 g/dL   AST 19 15 - 41 U/L   ALT 23 0 - 44 U/L   Alkaline Phosphatase 102 38 - 126 U/L   Total Bilirubin 0.5 0.0 - 1.2  mg/dL   GFR, Estimated >16 >10 mL/min   Anion gap 9 5 - 15      Assessment & Plan:   Problem List Items Addressed This Visit   None    Follow up plan: No follow-ups on file.

## 2024-04-09 ENCOUNTER — Ambulatory Visit
Admission: RE | Admit: 2024-04-09 | Discharge: 2024-04-09 | Disposition: A | Discharge status: Home / Self Care | Source: Ambulatory Visit | Attending: Oncology | Admitting: Oncology

## 2024-04-09 DIAGNOSIS — Z8579 Personal history of other malignant neoplasms of lymphoid, hematopoietic and related tissues: Secondary | ICD-10-CM | POA: Insufficient documentation

## 2024-04-09 DIAGNOSIS — Z08 Encounter for follow-up examination after completed treatment for malignant neoplasm: Secondary | ICD-10-CM | POA: Diagnosis not present

## 2024-04-09 DIAGNOSIS — I700 Atherosclerosis of aorta: Secondary | ICD-10-CM | POA: Diagnosis not present

## 2024-04-09 DIAGNOSIS — Z8572 Personal history of non-Hodgkin lymphomas: Secondary | ICD-10-CM | POA: Diagnosis not present

## 2024-04-09 DIAGNOSIS — R918 Other nonspecific abnormal finding of lung field: Secondary | ICD-10-CM | POA: Diagnosis not present

## 2024-04-12 ENCOUNTER — Encounter: Payer: Self-pay | Admitting: Nurse Practitioner

## 2024-04-12 ENCOUNTER — Encounter: Payer: Self-pay | Admitting: Oncology

## 2024-04-12 NOTE — Telephone Encounter (Signed)
 CT done 7/21 hasn't resulted yet.  Outbound call, spoke to Ray who will expedite read to radiologist.

## 2024-04-13 NOTE — Telephone Encounter (Signed)
 Per Dr. Darold last OV note dated 12/09/23 I will see her back in 1 year but call her with the results of CT chest.  CT chest has resulted.  Forwarding to provider for review. Thank you.

## 2024-04-17 MED ORDER — CYANOCOBALAMIN 1000 MCG/ML IJ SOLN: 1000.0000 ug | INTRAMUSCULAR | 0 refills | Status: AC

## 2024-04-18 ENCOUNTER — Encounter: Payer: Self-pay | Admitting: Internal Medicine

## 2024-05-15 ENCOUNTER — Emergency Department
Admission: EM | Admit: 2024-05-15 | Discharge: 2024-05-15 | Disposition: A | Discharge status: Home / Self Care | Attending: Emergency Medicine | Admitting: Emergency Medicine

## 2024-05-15 ENCOUNTER — Encounter: Payer: Self-pay | Admitting: *Deleted

## 2024-05-15 ENCOUNTER — Emergency Department

## 2024-05-15 ENCOUNTER — Other Ambulatory Visit: Payer: Self-pay

## 2024-05-15 DIAGNOSIS — M549 Dorsalgia, unspecified: Secondary | ICD-10-CM | POA: Diagnosis not present

## 2024-05-15 DIAGNOSIS — E039 Hypothyroidism, unspecified: Secondary | ICD-10-CM | POA: Diagnosis not present

## 2024-05-15 DIAGNOSIS — M25562 Pain in left knee: Secondary | ICD-10-CM | POA: Diagnosis not present

## 2024-05-15 DIAGNOSIS — S8991XA Unspecified injury of right lower leg, initial encounter: Secondary | ICD-10-CM | POA: Diagnosis not present

## 2024-05-15 DIAGNOSIS — M25561 Pain in right knee: Secondary | ICD-10-CM | POA: Diagnosis not present

## 2024-05-15 DIAGNOSIS — Y92410 Unspecified street and highway as the place of occurrence of the external cause: Secondary | ICD-10-CM | POA: Insufficient documentation

## 2024-05-15 DIAGNOSIS — M545 Low back pain, unspecified: Secondary | ICD-10-CM | POA: Diagnosis not present

## 2024-05-15 DIAGNOSIS — Z8572 Personal history of non-Hodgkin lymphomas: Secondary | ICD-10-CM | POA: Insufficient documentation

## 2024-05-15 DIAGNOSIS — S8992XA Unspecified injury of left lower leg, initial encounter: Secondary | ICD-10-CM | POA: Diagnosis not present
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
## 2024-05-15 NOTE — Discharge Instructions (Signed)
 You will likely be more sore tomorrow than you are today, this is normal. After this your pain should slowly be improving. If not, you need to be seen by another health care provider. This could be your PCP, urgent care or in the emergency department. Return to the emergency department specifically, if you have development of chest pain, shortness of breath, abdominal pain, new abdominal bruising or any other symptom personally concerning to you.  You can take 650 mg of Tylenol  and 600 mg of ibuprofen  every 6 hours as needed for pain. You can use ice, heat, muscle creams and other topical pain relievers as well.
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
## 2024-12-07 ENCOUNTER — Ambulatory Visit: Admitting: Oncology

## 2024-12-07 ENCOUNTER — Other Ambulatory Visit
